# Patient Record
Sex: Female | Born: 1960 | Race: White | Hispanic: No | Smoking: Former smoker
Health system: Southern US, Community
[De-identification: ages and names within clinical notes are randomized; demographics above are authoritative.]

## PROBLEM LIST (undated history)

## (undated) DIAGNOSIS — F419 Anxiety disorder, unspecified: Secondary | ICD-10-CM

## (undated) DIAGNOSIS — R011 Cardiac murmur, unspecified: Secondary | ICD-10-CM

## (undated) DIAGNOSIS — E7800 Pure hypercholesterolemia, unspecified: Secondary | ICD-10-CM

## (undated) DIAGNOSIS — G510 Bell's palsy: Secondary | ICD-10-CM

## (undated) DIAGNOSIS — I10 Essential (primary) hypertension: Secondary | ICD-10-CM

## (undated) DIAGNOSIS — D649 Anemia, unspecified: Secondary | ICD-10-CM

## (undated) DIAGNOSIS — T7840XA Allergy, unspecified, initial encounter: Secondary | ICD-10-CM

## (undated) DIAGNOSIS — N200 Calculus of kidney: Secondary | ICD-10-CM

## (undated) DIAGNOSIS — F32A Depression, unspecified: Secondary | ICD-10-CM

## (undated) DIAGNOSIS — D869 Sarcoidosis, unspecified: Secondary | ICD-10-CM

## (undated) HISTORY — DX: Allergy, unspecified, initial encounter: T78.40XA

## (undated) HISTORY — DX: Calculus of kidney: N20.0

## (undated) HISTORY — DX: Sarcoidosis, unspecified: D86.9

## (undated) HISTORY — DX: Cardiac murmur, unspecified: R01.1

## (undated) HISTORY — DX: Anemia, unspecified: D64.9

## (undated) HISTORY — PX: CYST EXCISION: SHX5701

---

## 1998-07-29 ENCOUNTER — Other Ambulatory Visit: Admission: RE | Admit: 1998-07-29 | Discharge: 1998-07-29 | Payer: Self-pay | Admitting: Obstetrics and Gynecology

## 1998-11-17 ENCOUNTER — Other Ambulatory Visit: Admission: RE | Admit: 1998-11-17 | Discharge: 1998-11-17 | Payer: Self-pay | Admitting: Obstetrics and Gynecology

## 1999-08-05 ENCOUNTER — Other Ambulatory Visit: Admission: RE | Admit: 1999-08-05 | Discharge: 1999-08-05 | Payer: Self-pay | Admitting: Obstetrics and Gynecology

## 2000-10-24 ENCOUNTER — Other Ambulatory Visit: Admission: RE | Admit: 2000-10-24 | Discharge: 2000-10-24 | Payer: Self-pay | Admitting: Obstetrics and Gynecology

## 2001-10-28 ENCOUNTER — Other Ambulatory Visit: Admission: RE | Admit: 2001-10-28 | Discharge: 2001-10-28 | Payer: Self-pay | Admitting: Obstetrics and Gynecology

## 2002-02-19 ENCOUNTER — Emergency Department (HOSPITAL_COMMUNITY): Admission: EM | Admit: 2002-02-19 | Discharge: 2002-02-19 | Payer: Self-pay | Admitting: Emergency Medicine

## 2002-10-29 ENCOUNTER — Other Ambulatory Visit: Admission: RE | Admit: 2002-10-29 | Discharge: 2002-10-29 | Payer: Self-pay | Admitting: Obstetrics and Gynecology

## 2003-11-19 ENCOUNTER — Other Ambulatory Visit: Admission: RE | Admit: 2003-11-19 | Discharge: 2003-11-19 | Payer: Self-pay | Admitting: Obstetrics and Gynecology

## 2004-08-31 ENCOUNTER — Emergency Department (HOSPITAL_COMMUNITY): Admission: EM | Admit: 2004-08-31 | Discharge: 2004-08-31 | Payer: Self-pay | Admitting: Emergency Medicine

## 2004-11-23 ENCOUNTER — Other Ambulatory Visit: Admission: RE | Admit: 2004-11-23 | Discharge: 2004-11-23 | Payer: Self-pay | Admitting: Obstetrics and Gynecology

## 2005-12-06 ENCOUNTER — Other Ambulatory Visit: Admission: RE | Admit: 2005-12-06 | Discharge: 2005-12-06 | Payer: Self-pay | Admitting: Obstetrics and Gynecology

## 2009-11-12 ENCOUNTER — Encounter: Admission: RE | Admit: 2009-11-12 | Discharge: 2009-11-12 | Payer: Self-pay | Admitting: Obstetrics and Gynecology

## 2010-11-14 ENCOUNTER — Other Ambulatory Visit: Payer: Self-pay | Admitting: Obstetrics and Gynecology

## 2010-11-14 DIAGNOSIS — R928 Other abnormal and inconclusive findings on diagnostic imaging of breast: Secondary | ICD-10-CM

## 2010-11-21 ENCOUNTER — Ambulatory Visit
Admission: RE | Admit: 2010-11-21 | Discharge: 2010-11-21 | Disposition: A | Discharge status: Home / Self Care | Payer: BC Managed Care – PPO | Source: Ambulatory Visit | Attending: Obstetrics and Gynecology | Admitting: Obstetrics and Gynecology

## 2010-11-21 DIAGNOSIS — R928 Other abnormal and inconclusive findings on diagnostic imaging of breast: Secondary | ICD-10-CM

## 2013-02-05 ENCOUNTER — Encounter (INDEPENDENT_AMBULATORY_CARE_PROVIDER_SITE_OTHER): Payer: Self-pay | Admitting: *Deleted

## 2013-02-20 ENCOUNTER — Telehealth (INDEPENDENT_AMBULATORY_CARE_PROVIDER_SITE_OTHER): Payer: Self-pay | Admitting: *Deleted

## 2013-02-20 ENCOUNTER — Encounter (INDEPENDENT_AMBULATORY_CARE_PROVIDER_SITE_OTHER): Payer: Self-pay | Admitting: *Deleted

## 2013-02-20 ENCOUNTER — Other Ambulatory Visit (INDEPENDENT_AMBULATORY_CARE_PROVIDER_SITE_OTHER): Payer: Self-pay | Admitting: *Deleted

## 2013-02-20 DIAGNOSIS — Z1211 Encounter for screening for malignant neoplasm of colon: Secondary | ICD-10-CM

## 2013-02-20 MED ORDER — PEG-KCL-NACL-NASULF-NA ASC-C 100 G PO SOLR: 1.0000 | Freq: Once | ORAL | Status: DC

## 2013-02-20 NOTE — Telephone Encounter (Signed)
Patient needs movi prep 

## 2013-03-18 ENCOUNTER — Telehealth (INDEPENDENT_AMBULATORY_CARE_PROVIDER_SITE_OTHER): Payer: Self-pay | Admitting: *Deleted

## 2013-03-18 NOTE — Telephone Encounter (Signed)
  Procedure: tcs  Reason/Indication:  screening  Has patient had this procedure before?  no  If so, when, by whom and where?    Is there a family history of colon cancer?  no  Who?  What age when diagnosed?    Is patient diabetic?   no      Does patient have prosthetic heart valve?  no  Do you have a pacemaker?  no  Has patient ever had endocarditis? no  Has patient had joint replacement within last 12 months?  no  Is patient on Coumadin, Plavix and/or Aspirin? no  Medications: losartan 50 mg daily, imipram 25 mg 3-4 tab at bedtime, alprazolam 1 mg 1 tab up to 4 times a day  Allergies: nkda  Medication Adjustment:   Procedure date & time: 04/17/13 at 930

## 2013-03-18 NOTE — Telephone Encounter (Signed)
agree

## 2013-04-02 ENCOUNTER — Encounter (HOSPITAL_COMMUNITY): Payer: Self-pay

## 2013-04-17 ENCOUNTER — Encounter (HOSPITAL_COMMUNITY)
Admission: RE | Disposition: A | Discharge status: Home / Self Care | Payer: Self-pay | Source: Ambulatory Visit | Attending: Internal Medicine

## 2013-04-17 ENCOUNTER — Encounter (HOSPITAL_COMMUNITY): Payer: Self-pay

## 2013-04-17 ENCOUNTER — Ambulatory Visit (HOSPITAL_COMMUNITY)
Admission: RE | Admit: 2013-04-17 | Discharge: 2013-04-17 | Disposition: A | Discharge status: Home / Self Care | Payer: BC Managed Care – PPO | Source: Ambulatory Visit | Attending: Internal Medicine | Admitting: Internal Medicine

## 2013-04-17 DIAGNOSIS — K6389 Other specified diseases of intestine: Secondary | ICD-10-CM

## 2013-04-17 DIAGNOSIS — K5730 Diverticulosis of large intestine without perforation or abscess without bleeding: Secondary | ICD-10-CM

## 2013-04-17 DIAGNOSIS — D126 Benign neoplasm of colon, unspecified: Secondary | ICD-10-CM | POA: Insufficient documentation

## 2013-04-17 DIAGNOSIS — Q438 Other specified congenital malformations of intestine: Secondary | ICD-10-CM | POA: Insufficient documentation

## 2013-04-17 DIAGNOSIS — Z1211 Encounter for screening for malignant neoplasm of colon: Secondary | ICD-10-CM

## 2013-04-17 DIAGNOSIS — K648 Other hemorrhoids: Secondary | ICD-10-CM | POA: Insufficient documentation

## 2013-04-17 DIAGNOSIS — K644 Residual hemorrhoidal skin tags: Secondary | ICD-10-CM

## 2013-04-17 HISTORY — PX: COLONOSCOPY: SHX5424

## 2013-04-17 HISTORY — DX: Anxiety disorder, unspecified: F41.9

## 2013-04-17 HISTORY — DX: Essential (primary) hypertension: I10

## 2013-04-17 SURGERY — COLONOSCOPY
Anesthesia: Moderate Sedation

## 2013-04-17 MED ORDER — SODIUM CHLORIDE 0.9 % IJ SOLN
INTRAMUSCULAR | Status: AC
  Filled 2013-04-17: qty 10

## 2013-04-17 MED ORDER — SODIUM CHLORIDE 0.9 % IV SOLN
INTRAVENOUS | Status: DC
  Administered 2013-04-17: 09:00:00 via INTRAVENOUS

## 2013-04-17 MED ORDER — STERILE WATER FOR IRRIGATION IR SOLN
Status: DC | PRN
  Administered 2013-04-17: 09:00:00

## 2013-04-17 MED ORDER — MIDAZOLAM HCL 5 MG/5ML IJ SOLN
INTRAMUSCULAR | Status: AC
  Filled 2013-04-17: qty 10

## 2013-04-17 MED ORDER — MIDAZOLAM HCL 5 MG/5ML IJ SOLN
INTRAMUSCULAR | Status: AC
  Filled 2013-04-17: qty 5

## 2013-04-17 MED ORDER — PROMETHAZINE HCL 25 MG/ML IJ SOLN
INTRAMUSCULAR | Status: DC | PRN
  Administered 2013-04-17 (×2): 12.5 mg via INTRAVENOUS

## 2013-04-17 MED ORDER — MIDAZOLAM HCL 5 MG/5ML IJ SOLN
INTRAMUSCULAR | Status: DC | PRN
  Administered 2013-04-17: 2 mg via INTRAVENOUS
  Administered 2013-04-17: 3 mg via INTRAVENOUS
  Administered 2013-04-17 (×3): 2 mg via INTRAVENOUS
  Administered 2013-04-17: 3 mg via INTRAVENOUS
  Administered 2013-04-17: 2 mg via INTRAVENOUS

## 2013-04-17 MED ORDER — PROMETHAZINE HCL 25 MG/ML IJ SOLN
INTRAMUSCULAR | Status: AC
  Filled 2013-04-17: qty 1

## 2013-04-17 MED ORDER — MEPERIDINE HCL 50 MG/ML IJ SOLN
INTRAMUSCULAR | Status: AC
  Filled 2013-04-17: qty 1

## 2013-04-17 MED ORDER — MEPERIDINE HCL 50 MG/ML IJ SOLN
INTRAMUSCULAR | Status: DC | PRN
  Administered 2013-04-17 (×2): 25 mg via INTRAVENOUS

## 2013-04-17 NOTE — OR Nursing (Signed)
Patient received 16mg  Versed. Wasted 4mg  of Versed in pyxis. Pyxis only shows 15mg  Versed pulled out when actually 20 mg was pulled out creating a discrepancy.

## 2013-04-17 NOTE — H&P (Signed)
Leah Padilla is an 52 y.o. female.   Chief Complaint: Patient is for colonoscopy. HPI: Patients 53 year old Caucasian female who is here for screening colonoscopy. She denies abdominal pain change in bowel habits or bleeding. Family history is negative for colorectal carcinoma.  Past Medical History  Diagnosis Date  . Hypertension   . Anxiety     Past Surgical History  Procedure Laterality Date  . Cyst excision      neck    History reviewed. No pertinent family history. Social History:  reports that she has been smoking.  She does not have any smokeless tobacco history on file. She reports that  drinks alcohol. She reports that she does not use illicit drugs.  Allergies: No Known Allergies  Medications Prior to Admission  Medication Sig Dispense Refill  . imipramine (TOFRANIL) 25 MG tablet Take 100 mg by mouth at bedtime.      Marland Kitchen LORazepam (ATIVAN) 1 MG tablet Take 1 mg by mouth daily as needed for anxiety.      Marland Kitchen losartan (COZAAR) 50 MG tablet Take 50 mg by mouth daily.      . peg 3350 powder (MOVIPREP) 100 G SOLR Take 1 kit (100 g total) by mouth once.  1 kit  0    No results found for this or any previous visit (from the past 48 hour(s)). No results found.  ROS  Blood pressure 153/90, pulse 107, temperature 98.2 F (36.8 C), temperature source Oral, resp. rate 22, height 5\' 9"  (1.753 m), weight 155 lb (70.308 kg), SpO2 99.00%. Physical Exam  Constitutional: She appears well-developed and well-nourished.  HENT:  Mouth/Throat: Oropharynx is clear and moist.  Eyes: Conjunctivae are normal. No scleral icterus.  Neck: No thyromegaly present.  Cardiovascular: Normal rate, regular rhythm and normal heart sounds.   No murmur heard. Respiratory: Effort normal and breath sounds normal.  GI: Soft. She exhibits no distension and no mass. There is no tenderness.  Musculoskeletal: She exhibits no edema.  Lymphadenopathy:    She has no cervical adenopathy.  Neurological: She  is alert.  Skin: Skin is warm and dry.     Assessment/Plan Average risk screening colonoscopy.  Leah Padilla U 04/17/2013, 9:15 AM

## 2013-04-17 NOTE — Op Note (Signed)
COLONOSCOPY PROCEDURE REPORT  PATIENT:  Leah Padilla  MR#:  [id number] Birthdate:  1961/04/13, 52 y.o., female Endoscopist:  Dr. Malissa Hippo, MD Referred By:  Dr. Kirk Ruths, MD Procedure Date: 04/17/2013  Procedure:   Colonoscopy with snare polypectomy.  Indications:  Patient is 52 year old Caucasian female who is undergoing average risk screening colonoscopy.  Informed Consent:  The procedure and risks were reviewed with the patient and informed consent was obtained.  Medications:  Demerol 50 mg IV Versed 16 mg IV Promethazine 25 mg IV and diluted form.  Description of procedure:  After a digital rectal exam was performed, that colonoscope was advanced from the anus through the rectum and colon to the area of the cecum, ileocecal valve and appendiceal orifice. The cecum was deeply intubated. These structures were well-seen and photographed for the record. From the level of the cecum and ileocecal valve, the scope was slowly and cautiously withdrawn. The mucosal surfaces were carefully surveyed utilizing scope tip to flexion to facilitate fold flattening as needed. The scope was pulled down into the rectum where a thorough exam including retroflexion was performed.  Findings:   Prep satisfactory. Tortuous junction of sigmoid and descending colon as well as splenic flexure. Procedure was begun with pediatric colonoscope and completed with slim colonoscope. 4 x 8 mm polyp snared from ascending colon. Single small doublets limited sigmoid colon. Normal rectal mucosa. Small hemorrhoids below the dentate line along with anal papilla.   Therapeutic/Diagnostic Maneuvers Performed:  See above  Complications:  None  Cecal Withdrawal Time:  18 minutes  Impression:  Examination performed to cecum. 4 by 8 mm snared from ascending colon. Single small diverticulum at sigmoid colon. Small external hemorrhoids and anal papillae.  Recommendations:  Standard instructions  given. No aspirin or NSAIDs for 1 week. I will contact patient with biopsy results and further recommendations.  Tzvi Economou U  04/17/2013 10:35 AM  CC: Dr. Kirk Ruths, MD & Dr. Bonnetta Barry ref. provider found

## 2013-04-18 ENCOUNTER — Encounter (HOSPITAL_COMMUNITY): Payer: Self-pay | Admitting: Internal Medicine

## 2013-04-23 ENCOUNTER — Encounter (INDEPENDENT_AMBULATORY_CARE_PROVIDER_SITE_OTHER): Payer: Self-pay | Admitting: *Deleted

## 2013-12-19 ENCOUNTER — Other Ambulatory Visit: Payer: Self-pay | Admitting: Obstetrics and Gynecology

## 2014-12-23 ENCOUNTER — Other Ambulatory Visit: Payer: Self-pay | Admitting: Obstetrics and Gynecology

## 2014-12-24 LAB — CYTOLOGY - PAP

## 2015-09-12 ENCOUNTER — Encounter (HOSPITAL_COMMUNITY): Payer: Self-pay | Admitting: Emergency Medicine

## 2015-09-12 ENCOUNTER — Emergency Department (HOSPITAL_COMMUNITY): Payer: 59

## 2015-09-12 ENCOUNTER — Emergency Department (HOSPITAL_COMMUNITY)
Admission: EM | Admit: 2015-09-12 | Discharge: 2015-09-12 | Disposition: A | Discharge status: Home / Self Care | Payer: 59 | Attending: Emergency Medicine | Admitting: Emergency Medicine

## 2015-09-12 DIAGNOSIS — R109 Unspecified abdominal pain: Secondary | ICD-10-CM | POA: Diagnosis present

## 2015-09-12 DIAGNOSIS — I10 Essential (primary) hypertension: Secondary | ICD-10-CM | POA: Diagnosis not present

## 2015-09-12 DIAGNOSIS — N390 Urinary tract infection, site not specified: Secondary | ICD-10-CM

## 2015-09-12 DIAGNOSIS — F419 Anxiety disorder, unspecified: Secondary | ICD-10-CM | POA: Insufficient documentation

## 2015-09-12 DIAGNOSIS — F17210 Nicotine dependence, cigarettes, uncomplicated: Secondary | ICD-10-CM | POA: Insufficient documentation

## 2015-09-12 DIAGNOSIS — Z79899 Other long term (current) drug therapy: Secondary | ICD-10-CM | POA: Diagnosis not present

## 2015-09-12 LAB — URINALYSIS, ROUTINE W REFLEX MICROSCOPIC
Glucose, UA: NEGATIVE mg/dL
Ketones, ur: NEGATIVE mg/dL
LEUKOCYTES UA: NEGATIVE
NITRITE: NEGATIVE
Specific Gravity, Urine: 1.02 (ref 1.005–1.030)
pH: 6.5 (ref 5.0–8.0)

## 2015-09-12 LAB — URINE MICROSCOPIC-ADD ON

## 2015-09-12 LAB — CBC WITH DIFFERENTIAL/PLATELET
BASOS ABS: 0 10*3/uL (ref 0.0–0.1)
BASOS PCT: 0 %
Eosinophils Absolute: 0 10*3/uL (ref 0.0–0.7)
Eosinophils Relative: 0 %
HCT: 44 % (ref 36.0–46.0)
HEMOGLOBIN: 14.6 g/dL (ref 12.0–15.0)
LYMPHS PCT: 6 %
Lymphs Abs: 0.7 10*3/uL (ref 0.7–4.0)
MCH: 29.6 pg (ref 26.0–34.0)
MCHC: 33.2 g/dL (ref 30.0–36.0)
MCV: 89.2 fL (ref 78.0–100.0)
Monocytes Absolute: 0.8 10*3/uL (ref 0.1–1.0)
Monocytes Relative: 7 %
NEUTROS ABS: 10.4 10*3/uL — AB (ref 1.7–7.7)
NEUTROS PCT: 87 %
Platelets: 236 10*3/uL (ref 150–400)
RBC: 4.93 MIL/uL (ref 3.87–5.11)
RDW: 13.1 % (ref 11.5–15.5)
WBC: 11.9 10*3/uL — AB (ref 4.0–10.5)

## 2015-09-12 LAB — COMPREHENSIVE METABOLIC PANEL
ALK PHOS: 92 U/L (ref 38–126)
ALT: 21 U/L (ref 14–54)
ANION GAP: 9 (ref 5–15)
AST: 24 U/L (ref 15–41)
Albumin: 4.5 g/dL (ref 3.5–5.0)
BILIRUBIN TOTAL: 1 mg/dL (ref 0.3–1.2)
BUN: 14 mg/dL (ref 6–20)
CALCIUM: 9 mg/dL (ref 8.9–10.3)
CO2: 27 mmol/L (ref 22–32)
Chloride: 100 mmol/L — ABNORMAL LOW (ref 101–111)
Creatinine, Ser: 0.6 mg/dL (ref 0.44–1.00)
GFR calc non Af Amer: >60 mL/min (ref >60)
Glucose, Bld: 111 mg/dL — ABNORMAL HIGH (ref 65–99)
POTASSIUM: 3 mmol/L — AB (ref 3.5–5.1)
SODIUM: 136 mmol/L (ref 135–145)
TOTAL PROTEIN: 7.8 g/dL (ref 6.5–8.1)

## 2015-09-12 MED ORDER — SODIUM CHLORIDE 0.9 % IV BOLUS (SEPSIS)
1000.0000 mL | Freq: Once | INTRAVENOUS | Status: AC
  Administered 2015-09-12: 1000 mL via INTRAVENOUS

## 2015-09-12 MED ORDER — FENTANYL CITRATE (PF) 100 MCG/2ML IJ SOLN: 50.0000 ug | Freq: Once | INTRAMUSCULAR | Status: DC

## 2015-09-12 MED ORDER — KETOROLAC TROMETHAMINE 30 MG/ML IJ SOLN
30.0000 mg | Freq: Once | INTRAMUSCULAR | Status: AC
  Administered 2015-09-12: 30 mg via INTRAVENOUS
  Filled 2015-09-12: qty 1

## 2015-09-12 MED ORDER — DIAZEPAM 5 MG PO TABS: 5.0000 mg | ORAL_TABLET | Freq: Four times a day (QID) | ORAL | Status: DC | PRN

## 2015-09-12 MED ORDER — CEPHALEXIN 500 MG PO CAPS: 500.0000 mg | ORAL_CAPSULE | Freq: Four times a day (QID) | ORAL | Status: DC

## 2015-09-12 MED ORDER — ONDANSETRON HCL 4 MG/2ML IJ SOLN
4.0000 mg | Freq: Once | INTRAMUSCULAR | Status: AC
  Administered 2015-09-12: 4 mg via INTRAVENOUS
  Filled 2015-09-12: qty 2

## 2015-09-12 MED ORDER — CEFTRIAXONE SODIUM 1 G IJ SOLR
1.0000 g | Freq: Once | INTRAMUSCULAR | Status: AC
  Administered 2015-09-12: 1 g via INTRAVENOUS
  Filled 2015-09-12: qty 10

## 2015-09-12 MED ORDER — HYDROCODONE-ACETAMINOPHEN 5-325 MG PO TABS
1.0000 | ORAL_TABLET | Freq: Four times a day (QID) | ORAL | Status: DC | PRN
Start: 2015-09-12 — End: 2016-08-21

## 2015-09-12 NOTE — ED Provider Notes (Signed)
CSN: [id number]     Arrival date & time 09/12/15  1618 History   First MD Initiated Contact with Patient 09/12/15 1758     Chief Complaint  Patient presents with  . Flank Pain     (Consider location/radiation/quality/duration/timing/severity/associated sxs/prior Treatment) Patient is a 54 y.o. female presenting with back pain.  Back Pain Pain location: right lateral lumbar area. Quality:  Aching Radiates to:  Does not radiate Pain severity:  Mild Onset quality:  Gradual Duration:  2 weeks Timing:  Constant Progression:  Worsening Chronicity:  New Context: not emotional stress   Associated symptoms: no chest pain and no fever     Past Medical History  Diagnosis Date  . Hypertension   . Anxiety    Past Surgical History  Procedure Laterality Date  . Cyst excision      neck  . Colonoscopy N/A 04/17/2013    Procedure: COLONOSCOPY;  Surgeon: Rogene Houston, MD;  Location: [facility];  Service: Endoscopy;  Laterality: N/A;  930   Family History  Problem Relation Age of Onset  . Stroke Other   . Diabetes Other    Social History  Substance Use Topics  . Smoking status: Current Every Day Smoker -- 0.50 packs/day for 13 years    Types: Cigarettes  . Smokeless tobacco: Never Used  . Alcohol Use: Yes     Comment: occasional   OB History    Gravida Para Term Preterm AB TAB SAB Ectopic Multiple Living   2 2 2       2      Review of Systems  Constitutional: Negative for fever, chills and fatigue.  Eyes: Negative for pain.  Respiratory: Negative for cough, choking and shortness of breath.   Cardiovascular: Negative for chest pain.  Endocrine: Negative for polydipsia and polyuria.  Musculoskeletal: Positive for back pain. Negative for myalgias.  All other systems reviewed and are negative.     Allergies  Polymox  Home Medications   Prior to Admission medications   Medication Sig Start Date End Date Taking? Authorizing Provider  imipramine (TOFRANIL) 25 MG  tablet Take 100 mg by mouth at bedtime.    Historical Provider, MD  LORazepam (ATIVAN) 1 MG tablet Take 1 mg by mouth daily as needed for anxiety.    Historical Provider, MD  losartan (COZAAR) 50 MG tablet Take 50 mg by mouth daily.    Historical Provider, MD   BP 113/76 mmHg  Pulse 102  Temp(Src) 98.2 F (36.8 C) (Oral)  Resp 14  Ht 5\' 9"  (1.753 m)  Wt 156 lb (70.761 kg)  BMI 23.03 kg/m2  SpO2 99% Physical Exam  Constitutional: She is oriented to person, place, and time. She appears well-developed and well-nourished.  HENT:  Head: Normocephalic and atraumatic.  Eyes: Conjunctivae are normal. Pupils are equal, round, and reactive to light.  Neck: Normal range of motion.  Cardiovascular: Normal rate and regular rhythm.   Pulmonary/Chest: No stridor. No respiratory distress. She has no wheezes. She has no rales.  Abdominal: She exhibits no distension.  Musculoskeletal: Normal range of motion. She exhibits no edema or tenderness.  Neurological: She is alert and oriented to person, place, and time.  Nursing note and vitals reviewed.   ED Course  Procedures (including critical care time) Labs Review Labs Reviewed  URINALYSIS, ROUTINE W REFLEX MICROSCOPIC (NOT AT [facility]    Imaging Review No results found. I have personally reviewed and evaluated these images and lab results as part of  my medical decision-making.   EKG Interpretation None      MDM   Final diagnoses:  Flank pain  UTI (lower urinary tract infection)   Flank pain, currently being treated for UTI, symptoms haven't improved. Here appears well, slight ttp over right lower pelvis, VS WNL. Concern for possible MSK vs appy vs kidney stone vs perinephric abscess vs. Pyelo/cystitis. Will eval appropriately.   Ct without acute abnormality, ua with likely infection, symptoms improved. Rocephin given, will dc macrobid adn start keflex back. rx for muscle relaxers and short course of pain meds provided. Will fu w/ pcp in  3-4 days for reevaluation, otherwise return here for new/worsening symptoms.   Merrily Pew, MD 09/12/15 [phone]

## 2015-09-12 NOTE — ED Notes (Signed)
Patient c/o right flank pain that radiates into right lower abd. Per patient recently being treated for UTI with antibiotics by PCP but felt she was not improving. Patient called PCP  And they changed antibiotics, patient re-checked in office and had UA done which see was told was improving except for blood in urine. Patient reports nausea and vomiting that started last night. Denies hx of kidney stones.

## 2015-09-12 NOTE — ED Notes (Signed)
Discharge instructions given, pt demonstrated teach back and verbal understanding. No concerns voiced.  

## 2015-09-14 MED FILL — Hydrocodone-Acetaminophen Tab 5-325 MG: ORAL | Qty: 6 | Status: AC

## 2016-02-11 ENCOUNTER — Other Ambulatory Visit: Payer: Self-pay | Admitting: Obstetrics & Gynecology

## 2016-02-14 LAB — CYTOLOGY - PAP

## 2016-02-21 ENCOUNTER — Other Ambulatory Visit: Payer: Self-pay | Admitting: Obstetrics & Gynecology

## 2016-02-21 DIAGNOSIS — R928 Other abnormal and inconclusive findings on diagnostic imaging of breast: Secondary | ICD-10-CM

## 2016-02-25 ENCOUNTER — Ambulatory Visit
Admission: RE | Admit: 2016-02-25 | Discharge: 2016-02-25 | Disposition: A | Discharge status: Home / Self Care | Payer: 59 | Source: Ambulatory Visit | Attending: Obstetrics & Gynecology | Admitting: Obstetrics & Gynecology

## 2016-02-25 DIAGNOSIS — R928 Other abnormal and inconclusive findings on diagnostic imaging of breast: Secondary | ICD-10-CM

## 2016-08-21 ENCOUNTER — Emergency Department (HOSPITAL_COMMUNITY)
Admission: EM | Admit: 2016-08-21 | Discharge: 2016-08-21 | Disposition: A | Discharge status: Home / Self Care | Payer: 59 | Attending: Emergency Medicine | Admitting: Emergency Medicine

## 2016-08-21 ENCOUNTER — Encounter (HOSPITAL_COMMUNITY): Payer: Self-pay | Admitting: Emergency Medicine

## 2016-08-21 DIAGNOSIS — R109 Unspecified abdominal pain: Secondary | ICD-10-CM | POA: Insufficient documentation

## 2016-08-21 DIAGNOSIS — R110 Nausea: Secondary | ICD-10-CM | POA: Insufficient documentation

## 2016-08-21 DIAGNOSIS — R350 Frequency of micturition: Secondary | ICD-10-CM | POA: Insufficient documentation

## 2016-08-21 DIAGNOSIS — M549 Dorsalgia, unspecified: Secondary | ICD-10-CM | POA: Diagnosis not present

## 2016-08-21 DIAGNOSIS — R6883 Chills (without fever): Secondary | ICD-10-CM | POA: Diagnosis not present

## 2016-08-21 DIAGNOSIS — F17210 Nicotine dependence, cigarettes, uncomplicated: Secondary | ICD-10-CM | POA: Insufficient documentation

## 2016-08-21 DIAGNOSIS — R5383 Other fatigue: Secondary | ICD-10-CM | POA: Diagnosis not present

## 2016-08-21 DIAGNOSIS — Z79899 Other long term (current) drug therapy: Secondary | ICD-10-CM | POA: Insufficient documentation

## 2016-08-21 DIAGNOSIS — I10 Essential (primary) hypertension: Secondary | ICD-10-CM | POA: Insufficient documentation

## 2016-08-21 LAB — URINALYSIS, ROUTINE W REFLEX MICROSCOPIC
Bilirubin Urine: NEGATIVE
Glucose, UA: NEGATIVE mg/dL
Ketones, ur: NEGATIVE mg/dL
Leukocytes, UA: NEGATIVE
Nitrite: NEGATIVE
Protein, ur: NEGATIVE mg/dL
Specific Gravity, Urine: <1.005 — ABNORMAL LOW (ref 1.005–1.030)
pH: 5.5 (ref 5.0–8.0)

## 2016-08-21 LAB — COMPREHENSIVE METABOLIC PANEL
ALT: 31 U/L (ref 14–54)
AST: 29 U/L (ref 15–41)
Albumin: 4.9 g/dL (ref 3.5–5.0)
Alkaline Phosphatase: 73 U/L (ref 38–126)
Anion gap: 8 (ref 5–15)
BUN: 9 mg/dL (ref 6–20)
CO2: 25 mmol/L (ref 22–32)
Calcium: 9.8 mg/dL (ref 8.9–10.3)
Chloride: 104 mmol/L (ref 101–111)
Creatinine, Ser: 0.6 mg/dL (ref 0.44–1.00)
GFR calc Af Amer: >60 mL/min (ref >60)
GFR calc non Af Amer: >60 mL/min (ref >60)
Glucose, Bld: 99 mg/dL (ref 65–99)
Potassium: 3.3 mmol/L — ABNORMAL LOW (ref 3.5–5.1)
Sodium: 137 mmol/L (ref 135–145)
Total Bilirubin: 0.8 mg/dL (ref 0.3–1.2)
Total Protein: 8.1 g/dL (ref 6.5–8.1)

## 2016-08-21 LAB — CBC
HCT: 42.5 % (ref 36.0–46.0)
Hemoglobin: 14.3 g/dL (ref 12.0–15.0)
MCH: 29.6 pg (ref 26.0–34.0)
MCHC: 33.6 g/dL (ref 30.0–36.0)
MCV: 88 fL (ref 78.0–100.0)
Platelets: 303 10*3/uL (ref 150–400)
RBC: 4.83 MIL/uL (ref 3.87–5.11)
RDW: 13 % (ref 11.5–15.5)
WBC: 11.3 10*3/uL — ABNORMAL HIGH (ref 4.0–10.5)

## 2016-08-21 LAB — URINE MICROSCOPIC-ADD ON

## 2016-08-21 NOTE — ED Provider Notes (Signed)
[facility] DEPT Provider Note   CSN: [id number] Arrival date & time: 08/21/16  1340  By signing my name below, I, Rayna Sexton, attest that this documentation has been prepared under the direction and in the presence of Virgel Manifold, MD. Electronically Signed: Rayna Sexton, ED Scribe. 08/21/16. 4:43 PM.   History   Chief Complaint Chief Complaint  Patient presents with  . Chills    HPI HPI Comments: Leah Padilla is a 55 y.o. female who presents to the Emergency Department complaining of persistent chills x 2 weeks. She states she was originally dx with a UTI two weeks ago by her PCP. Pt states she was experiencing nausea, chills and general malaise and was d/c with doxycyline which she completed w/o relief. She was seen again by her PCP and given another round of doxycycline as well as an injection and denies any relief. Pt states her symptoms have persisted with associated urinary frequency, mild lower back pain and mild diffuse abdominal pain. Pt was sent to the ED by her PCP due to concern for a renal abscess. She denies fever, hematuria, SOB and cough.   The history is provided by the patient and medical records. No language interpreter was used.    Past Medical History:  Diagnosis Date  . Anxiety   . Hypertension     There are no active problems to display for this patient.   Past Surgical History:  Procedure Laterality Date  . COLONOSCOPY N/A 04/17/2013   Procedure: COLONOSCOPY;  Surgeon: Rogene Houston, MD;  Location: [facility];  Service: Endoscopy;  Laterality: N/A;  930  . CYST EXCISION     neck    OB History    Gravida Para Term Preterm AB Living   2 2 2     2    SAB TAB Ectopic Multiple Live Births                   Home Medications    Prior to Admission medications   Medication Sig Start Date End Date Taking? Authorizing Provider  ALPRAZolam Duanne Moron) 1 MG tablet Take 1 mg by mouth 4 (four) times daily as needed for anxiety.  09/08/15  Yes  Historical Provider, MD  doxycycline (VIBRAMYCIN) 100 MG capsule Take 100 mg by mouth 2 (two) times daily. 7 day course starting on 08/16/2016 08/16/16  Yes Historical Provider, MD  losartan (COZAAR) 50 MG tablet Take 50 mg by mouth daily.   Yes Historical Provider, MD  levofloxacin (LEVAQUIN) 500 MG tablet Take 500 mg by mouth daily. 10 day course starting on 08/08/2016-COMPLETED 08/08/16   Historical Provider, MD    Family History Family History  Problem Relation Age of Onset  . Stroke Other   . Diabetes Other     Social History Social History  Substance Use Topics  . Smoking status: Current Every Day Smoker    Packs/day: 0.50    Years: 13.00    Types: Cigarettes  . Smokeless tobacco: Never Used  . Alcohol use Yes     Comment: occasional     Allergies   Polymox [amoxicillin]   Review of Systems Review of Systems  Constitutional: Positive for chills and fatigue. Negative for fever.  Respiratory: Negative for cough and shortness of breath.   Gastrointestinal: Positive for abdominal pain.  Genitourinary: Positive for frequency. Negative for difficulty urinating, dysuria and hematuria.  Musculoskeletal: Positive for back pain.  All other systems reviewed and are negative.  Physical Exam Updated Vital Signs  BP 121/91 (BP Location: Left Arm)   Pulse 107   Temp 98.4 F (36.9 C) (Oral)   Resp 16   Ht 5\' 9"  (1.753 m)   Wt 154 lb (69.9 kg)   SpO2 100%   BMI 22.74 kg/m   Physical Exam  Constitutional: She is oriented to person, place, and time. She appears well-developed and well-nourished. No distress.  HENT:  Head: Normocephalic and atraumatic.  Eyes: EOM are normal.  Neck: Normal range of motion.  Cardiovascular: Normal rate, regular rhythm and normal heart sounds.   Pulmonary/Chest: Effort normal and breath sounds normal.  Abdominal: Soft. She exhibits no distension. There is no tenderness.  Musculoskeletal: Normal range of motion.  Neurological: She is alert  and oriented to person, place, and time.  Skin: Skin is warm and dry.  Psychiatric: She has a normal mood and affect. Judgment normal.  Nursing note and vitals reviewed.  ED Treatments / Results  Labs (all labs ordered are listed, but only abnormal results are displayed) Labs Reviewed  URINALYSIS, ROUTINE W REFLEX MICROSCOPIC (NOT AT [facility]) - Abnormal; Notable for the following:       Result Value   Specific Gravity, Urine <1.005 (*)    Hgb urine dipstick TRACE (*)    All other components within normal limits  COMPREHENSIVE METABOLIC PANEL - Abnormal; Notable for the following:    Potassium 3.3 (*)    All other components within normal limits  CBC - Abnormal; Notable for the following:    WBC 11.3 (*)    All other components within normal limits  URINE MICROSCOPIC-ADD ON - Abnormal; Notable for the following:    Squamous Epithelial / LPF 0-5 (*)    Bacteria, UA FEW (*)    All other components within normal limits    EKG  EKG Interpretation None       Radiology No results found.  Procedures Procedures  DIAGNOSTIC STUDIES: Oxygen Saturation is 100% on RA, normal by my interpretation.    COORDINATION OF CARE: 4:42 PM Discussed next steps with pt. Pt verbalized understanding and is agreeable with the plan.    Medications Ordered in ED Medications - No data to display   Initial Impression / Assessment and Plan / ED Course  I have reviewed the triage vital signs and the nursing notes.  Pertinent labs & imaging results that were available during my care of the patient were reviewed by me and considered in my medical decision making (see chart for details).  Clinical Course     55yF with chills. Her exam is reassuring. She is afebrile. He workup fairly unremarkable. Mild tachycardia noted, but clinically doubt sepsis or other emergent condition.  Final Clinical Impressions(s) / ED Diagnoses   Final diagnoses:  Chills    New Prescriptions New Prescriptions   No  medications on file     Virgel Manifold, MD 08/31/16 1433

## 2016-08-21 NOTE — ED Triage Notes (Signed)
Patient states she was told by PCP that she had UTI 2 weeks ago. States she has been on antibiotics x 2 with no relief. Complaining of "chills" x 1 week. States she called PCP and was told to go to ER "because I may have an abscess." Patient denies urinary symptoms. States "I have UTI's and never have symptoms."

## 2017-02-13 ENCOUNTER — Other Ambulatory Visit: Payer: Self-pay | Admitting: Obstetrics & Gynecology

## 2017-02-13 DIAGNOSIS — R300 Dysuria: Secondary | ICD-10-CM | POA: Diagnosis not present

## 2017-02-23 ENCOUNTER — Other Ambulatory Visit: Payer: Self-pay | Admitting: Obstetrics & Gynecology

## 2017-02-23 DIAGNOSIS — Z6821 Body mass index (BMI) 21.0-21.9, adult: Secondary | ICD-10-CM | POA: Diagnosis not present

## 2017-02-23 DIAGNOSIS — Z01419 Encounter for gynecological examination (general) (routine) without abnormal findings: Secondary | ICD-10-CM | POA: Diagnosis not present

## 2017-02-23 DIAGNOSIS — Z124 Encounter for screening for malignant neoplasm of cervix: Secondary | ICD-10-CM | POA: Diagnosis not present

## 2017-02-28 LAB — CYTOLOGY - PAP

## 2017-03-14 DIAGNOSIS — E782 Mixed hyperlipidemia: Secondary | ICD-10-CM | POA: Diagnosis not present

## 2017-03-14 DIAGNOSIS — T07XXXA Unspecified multiple injuries, initial encounter: Secondary | ICD-10-CM | POA: Diagnosis not present

## 2017-03-14 DIAGNOSIS — Z6821 Body mass index (BMI) 21.0-21.9, adult: Secondary | ICD-10-CM | POA: Diagnosis not present

## 2017-04-02 DIAGNOSIS — J019 Acute sinusitis, unspecified: Secondary | ICD-10-CM | POA: Diagnosis not present

## 2017-04-02 DIAGNOSIS — S20469A Insect bite (nonvenomous) of unspecified back wall of thorax, initial encounter: Secondary | ICD-10-CM | POA: Diagnosis not present

## 2017-04-02 DIAGNOSIS — W57XXXA Bitten or stung by nonvenomous insect and other nonvenomous arthropods, initial encounter: Secondary | ICD-10-CM | POA: Diagnosis not present

## 2017-04-02 DIAGNOSIS — R110 Nausea: Secondary | ICD-10-CM | POA: Diagnosis not present

## 2017-04-02 DIAGNOSIS — Z6820 Body mass index (BMI) 20.0-20.9, adult: Secondary | ICD-10-CM | POA: Diagnosis not present

## 2017-08-22 DIAGNOSIS — R509 Fever, unspecified: Secondary | ICD-10-CM | POA: Diagnosis not present

## 2017-08-22 DIAGNOSIS — R51 Headache: Secondary | ICD-10-CM | POA: Diagnosis not present

## 2017-08-22 DIAGNOSIS — J0100 Acute maxillary sinusitis, unspecified: Secondary | ICD-10-CM | POA: Diagnosis not present

## 2017-12-20 DIAGNOSIS — Z1389 Encounter for screening for other disorder: Secondary | ICD-10-CM | POA: Diagnosis not present

## 2017-12-20 DIAGNOSIS — E748 Other specified disorders of carbohydrate metabolism: Secondary | ICD-10-CM | POA: Diagnosis not present

## 2017-12-20 DIAGNOSIS — Z6821 Body mass index (BMI) 21.0-21.9, adult: Secondary | ICD-10-CM | POA: Diagnosis not present

## 2017-12-20 DIAGNOSIS — Z0000 Encounter for general adult medical examination without abnormal findings: Secondary | ICD-10-CM | POA: Diagnosis not present

## 2018-03-13 DIAGNOSIS — Z01419 Encounter for gynecological examination (general) (routine) without abnormal findings: Secondary | ICD-10-CM | POA: Diagnosis not present

## 2018-03-13 DIAGNOSIS — F29 Unspecified psychosis not due to a substance or known physiological condition: Secondary | ICD-10-CM | POA: Insufficient documentation

## 2018-03-13 DIAGNOSIS — R87610 Atypical squamous cells of undetermined significance on cytologic smear of cervix (ASC-US): Secondary | ICD-10-CM | POA: Diagnosis not present

## 2018-04-05 ENCOUNTER — Encounter (INDEPENDENT_AMBULATORY_CARE_PROVIDER_SITE_OTHER): Payer: Self-pay | Admitting: *Deleted

## 2018-04-12 DIAGNOSIS — R208 Other disturbances of skin sensation: Secondary | ICD-10-CM | POA: Diagnosis not present

## 2018-05-02 DIAGNOSIS — Z08 Encounter for follow-up examination after completed treatment for malignant neoplasm: Secondary | ICD-10-CM | POA: Diagnosis not present

## 2018-05-02 DIAGNOSIS — N390 Urinary tract infection, site not specified: Secondary | ICD-10-CM | POA: Diagnosis not present

## 2018-06-24 DIAGNOSIS — N3021 Other chronic cystitis with hematuria: Secondary | ICD-10-CM | POA: Diagnosis not present

## 2018-07-12 DIAGNOSIS — Z6822 Body mass index (BMI) 22.0-22.9, adult: Secondary | ICD-10-CM | POA: Diagnosis not present

## 2018-07-12 DIAGNOSIS — N760 Acute vaginitis: Secondary | ICD-10-CM | POA: Diagnosis not present

## 2018-07-12 DIAGNOSIS — N952 Postmenopausal atrophic vaginitis: Secondary | ICD-10-CM | POA: Diagnosis not present

## 2018-07-12 DIAGNOSIS — Z1389 Encounter for screening for other disorder: Secondary | ICD-10-CM | POA: Diagnosis not present

## 2018-07-25 DIAGNOSIS — N390 Urinary tract infection, site not specified: Secondary | ICD-10-CM | POA: Diagnosis not present

## 2018-07-25 DIAGNOSIS — R208 Other disturbances of skin sensation: Secondary | ICD-10-CM | POA: Diagnosis not present

## 2018-07-25 DIAGNOSIS — B373 Candidiasis of vulva and vagina: Secondary | ICD-10-CM | POA: Diagnosis not present

## 2018-08-15 DIAGNOSIS — R309 Painful micturition, unspecified: Secondary | ICD-10-CM | POA: Diagnosis not present

## 2018-08-15 DIAGNOSIS — N390 Urinary tract infection, site not specified: Secondary | ICD-10-CM | POA: Diagnosis not present

## 2018-08-15 DIAGNOSIS — N840 Polyp of corpus uteri: Secondary | ICD-10-CM | POA: Diagnosis not present

## 2018-08-15 DIAGNOSIS — R102 Pelvic and perineal pain: Secondary | ICD-10-CM | POA: Diagnosis not present

## 2018-09-06 DIAGNOSIS — D259 Leiomyoma of uterus, unspecified: Secondary | ICD-10-CM | POA: Diagnosis not present

## 2018-09-06 DIAGNOSIS — N840 Polyp of corpus uteri: Secondary | ICD-10-CM | POA: Diagnosis not present

## 2018-09-11 ENCOUNTER — Other Ambulatory Visit: Payer: Self-pay | Admitting: Obstetrics & Gynecology

## 2018-09-11 DIAGNOSIS — D259 Leiomyoma of uterus, unspecified: Secondary | ICD-10-CM | POA: Diagnosis not present

## 2019-01-01 DIAGNOSIS — I10 Essential (primary) hypertension: Secondary | ICD-10-CM | POA: Diagnosis not present

## 2019-01-01 DIAGNOSIS — E7849 Other hyperlipidemia: Secondary | ICD-10-CM | POA: Diagnosis not present

## 2019-06-19 ENCOUNTER — Other Ambulatory Visit: Payer: Self-pay | Admitting: *Deleted

## 2019-06-19 DIAGNOSIS — Z20822 Contact with and (suspected) exposure to covid-19: Secondary | ICD-10-CM

## 2019-06-21 LAB — NOVEL CORONAVIRUS, NAA: SARS-CoV-2, NAA: NOT DETECTED

## 2019-06-23 ENCOUNTER — Telehealth: Payer: Self-pay | Admitting: General Practice

## 2019-06-23 NOTE — Telephone Encounter (Signed)
Pt called in for covid result.  °Advised of Not Detected result.  °

## 2019-07-15 ENCOUNTER — Telehealth: Payer: Self-pay | Admitting: Adult Health

## 2019-07-15 NOTE — Telephone Encounter (Signed)

## 2019-07-16 ENCOUNTER — Encounter: Payer: Self-pay | Admitting: Adult Health

## 2019-07-16 ENCOUNTER — Ambulatory Visit (INDEPENDENT_AMBULATORY_CARE_PROVIDER_SITE_OTHER): Payer: 59 | Admitting: Adult Health

## 2019-07-16 ENCOUNTER — Other Ambulatory Visit: Payer: Self-pay

## 2019-07-16 VITALS — BP 124/77 | HR 91 | Ht 69.0 in | Wt 165.0 lb

## 2019-07-16 DIAGNOSIS — R319 Hematuria, unspecified: Secondary | ICD-10-CM | POA: Insufficient documentation

## 2019-07-16 DIAGNOSIS — R300 Dysuria: Secondary | ICD-10-CM | POA: Insufficient documentation

## 2019-07-16 DIAGNOSIS — N816 Rectocele: Secondary | ICD-10-CM | POA: Diagnosis not present

## 2019-07-16 DIAGNOSIS — N951 Menopausal and female climacteric states: Secondary | ICD-10-CM | POA: Diagnosis not present

## 2019-07-16 DIAGNOSIS — R232 Flushing: Secondary | ICD-10-CM | POA: Insufficient documentation

## 2019-07-16 LAB — POCT URINALYSIS DIPSTICK OB
Blood, UA: 3
Glucose, UA: NEGATIVE
Ketones, UA: NEGATIVE
Nitrite, UA: NEGATIVE
POC,PROTEIN,UA: NEGATIVE

## 2019-07-16 MED ORDER — PROGESTERONE MICRONIZED 200 MG PO CAPS: 200.0000 mg | ORAL_CAPSULE | Freq: Every day | ORAL | 3 refills | Status: DC

## 2019-07-16 MED ORDER — NITROFURANTOIN MONOHYD MACRO 100 MG PO CAPS: ORAL_CAPSULE | ORAL | 1 refills | Status: DC

## 2019-07-16 MED ORDER — ESTRADIOL 1 MG PO TABS: 1.0000 mg | ORAL_TABLET | Freq: Every day | ORAL | 3 refills | Status: DC

## 2019-07-16 NOTE — Patient Instructions (Signed)
Menopause Menopause is the normal time of life when menstrual periods stop completely. It is usually confirmed by 12 months without a menstrual period. The transition to menopause (perimenopause) most often happens between the ages of 45 and 55. During perimenopause, hormone levels change in your body, which can cause symptoms and affect your health. Menopause may increase your risk for:  Loss of bone (osteoporosis), which causes bone breaks (fractures).  Depression.  Hardening and narrowing of the arteries (atherosclerosis), which can cause heart attacks and strokes. What are the causes? This condition is usually caused by a natural change in hormone levels that happens as you get older. The condition may also be caused by surgery to remove both ovaries (bilateral oophorectomy). What increases the risk? This condition is more likely to start at an earlier age if you have certain medical conditions or treatments, including:  A tumor of the pituitary gland in the brain.  A disease that affects the ovaries and hormone production.  Radiation treatment for cancer.  Certain cancer treatments, such as chemotherapy or hormone (anti-estrogen) therapy.  Heavy smoking and excessive alcohol use.  Family history of early menopause. This condition is also more likely to develop earlier in women who are very thin. What are the signs or symptoms? Symptoms of this condition include:  Hot flashes.  Irregular menstrual periods.  Night sweats.  Changes in feelings about sex. This could be a decrease in sex drive or an increased comfort around your sexuality.  Vaginal dryness and thinning of the vaginal walls. This may cause painful intercourse.  Dryness of the skin and development of wrinkles.  Headaches.  Problems sleeping (insomnia).  Mood swings or irritability.  Memory problems.  Weight gain.  Hair growth on the face and chest.  Bladder infections or problems with urinating. How  is this diagnosed? This condition is diagnosed based on your medical history, a physical exam, your age, your menstrual history, and your symptoms. Hormone tests may also be done. How is this treated? In some cases, no treatment is needed. You and your health care provider should make a decision together about whether treatment is necessary. Treatment will be based on your individual condition and preferences. Treatment for this condition focuses on managing symptoms. Treatment may include:  Menopausal hormone therapy (MHT).  Medicines to treat specific symptoms or complications.  Acupuncture.  Vitamin or herbal supplements. Before starting treatment, make sure to let your health care provider know if you have a personal or family history of:  Heart disease.  Breast cancer.  Blood clots.  Diabetes.  Osteoporosis. Follow these instructions at home: Lifestyle  Do not use any products that contain nicotine or tobacco, such as cigarettes and e-cigarettes. If you need help quitting, ask your health care provider.  Get at least 30 minutes of physical activity on 5 or more days each week.  Avoid alcoholic and caffeinated beverages, as well as spicy foods. This may help prevent hot flashes.  Get 7-8 hours of sleep each night.  If you have hot flashes, try: ? Dressing in layers. ? Avoiding things that may trigger hot flashes, such as spicy food, warm places, or stress. ? Taking slow, deep breaths when a hot flash starts. ? Keeping a fan in your home and office.  Find ways to manage stress, such as deep breathing, meditation, or journaling.  Consider going to group therapy with other women who are having menopause symptoms. Ask your health care provider about recommended group therapy meetings. Eating and   drinking  Eat a healthy, balanced diet that contains whole grains, lean protein, low-fat dairy, and plenty of fruits and vegetables.  Your health care provider may recommend  adding more soy to your diet. Foods that contain soy include tofu, tempeh, and soy milk.  Eat plenty of foods that contain calcium and vitamin D for bone health. Items that are rich in calcium include low-fat milk, yogurt, beans, almonds, sardines, broccoli, and kale. Medicines  Take over-the-counter and prescription medicines only as told by your health care provider.  Talk with your health care provider before starting any herbal supplements. If prescribed, take vitamins and supplements as told by your health care provider. These may include: ? Calcium. Women age 25 and older should get 1,200 mg (milligrams) of calcium every day. ? Vitamin D. Women need 600-800 International Units of vitamin D each day. ? Vitamins B12 and B6. Aim for 50 micrograms of B12 and 1.5 mg of B6 each day. General instructions  Keep track of your menstrual periods, including: ? When they occur. ? How heavy they are and how long they last. ? How much time passes between periods.  Keep track of your symptoms, noting when they start, how often you have them, and how long they last.  Use vaginal lubricants or moisturizers to help with vaginal dryness and improve comfort during sex.  Keep all follow-up visits as told by your health care provider. This is important. This includes any group therapy or counseling. Contact a health care provider if:  You are still having menstrual periods after age 66.  You have pain during sex.  You have not had a period for 12 months and you develop vaginal bleeding. Get help right away if:  You have: ? Severe depression. ? Excessive vaginal bleeding. ? Pain when you urinate. ? A fast or irregular heart beat (palpitations). ? Severe headaches. ? Abdomen (abdominal) pain or severe indigestion.  You fell and you think you have a broken bone.  You develop leg or chest pain.  You develop vision problems.  You feel a lump in your breast. Summary  Menopause is the normal  time of life when menstrual periods stop completely. It is usually confirmed by 12 months without a menstrual period.  The transition to menopause (perimenopause) most often happens between the ages of 79 and 16.  Symptoms can be managed through medicines, lifestyle changes, and complementary therapies such as acupuncture.  Eat a balanced diet that is rich in nutrients to promote bone health and heart health and to manage symptoms during menopause. This information is not intended to replace advice given to you by your health care provider. Make sure you discuss any questions you have with your health care provider. Document Released: 12/09/2003 Document Revised: 08/31/2017 Document Reviewed: 10/21/2016 Elsevier Patient Education  2020 [employer]. Menopause and Hormone Replacement Therapy Menopause is a normal time of life when menstrual periods stop completely and the ovaries stop producing the female hormones estrogen and progesterone. This lack of hormones can affect your health and cause undesirable symptoms. Hormone replacement therapy (HRT) can relieve some of those symptoms. What is hormone replacement therapy? HRT is the use of artificial (synthetic) hormones to replace hormones that your body has stopped producing because you have reached menopause. What are my options for HRT?  HRT may consist of the synthetic hormones estrogen and progestin, or it may consist of only estrogen (estrogen-only therapy). You and your health care provider will decide which form of HRT  is best for you. If you choose to be on HRT and you have a uterus, estrogen and progestin are usually prescribed. Estrogen-only therapy is used for women who do not have a uterus. Possible options for taking HRT include:  Pills.  Patches.  Gels.  Sprays.  Vaginal cream.  Vaginal rings.  Vaginal inserts. The amount of hormone(s) that you take and how long you take the hormone(s) varies according to your health.  It is important to:  Begin HRT with the lowest possible dosage.  Stop HRT as soon as your health care provider tells you to stop.  Work with your health care provider so that you feel informed and comfortable with your decisions. What are the benefits of HRT? HRT can reduce the frequency and severity of menopausal symptoms. Benefits of HRT vary according to the kind of symptoms that you have, how severe they are, and your overall health. HRT may help to improve the following symptoms of menopause:  Hot flashes and night sweats. These are sudden feelings of heat that spread over the face and body. The skin may turn red, like a blush. Night sweats are hot flashes that happen while you are sleeping or trying to sleep.  Bone loss (osteoporosis). The body loses calcium more quickly after menopause, causing the bones to become weaker. This can increase the risk for bone breaks (fractures).  Vaginal dryness. The lining of the vagina can become thin and dry, which can cause pain during sex or cause infection, burning, or itching.  Urinary tract infections.  Urinary incontinence. This is the inability to control when you pass urine.  Irritability.  Short-term memory problems. What are the risks of HRT? Risks of HRT vary depending on your individual health and medical history. Risks of HRT also depend on whether you receive both estrogen and progestin or you receive estrogen only. HRT may increase the risk of:  Spotting. This is when a small amount of blood leaks from the vagina unexpectedly.  Endometrial cancer. This cancer is in the lining of the uterus (endometrium).  Breast cancer.  Increased density of breast tissue. This can make it harder to find breast cancer on a breast X-ray (mammogram).  Stroke.  Heart disease.  Blood clots.  Gallbladder disease.  Liver disease. Risks of HRT can increase if you have any of the following conditions:  Endometrial cancer.  Liver disease.   Heart disease.  Breast cancer.  History of blood clots.  History of stroke. Follow these instructions at home:  Take over-the-counter and prescription medicines only as told by your health care provider.  Get mammograms, pelvic exams, and medical checkups as often as told by your health care provider.  Have Pap tests done as often as told by your health care provider. A Pap test is sometimes called a Pap smear. It is a screening test that is used to check for signs of cancer of the cervix and vagina. A Pap test can also identify the presence of infection or precancerous changes. Pap tests may be done: ? Every 3 years, starting at age 45. ? Every 5 years, starting after age 33, in combination with testing for human papillomavirus (HPV). ? More often or less often depending on other medical conditions you have, your age, and other risk factors.  It is up to you to get the results of your Pap test. Ask your health care provider, or the department that is doing the test, when your results will be ready.  Keep  all follow-up visits as told by your health care provider. This is important. Contact a health care provider if you have:  Pain or swelling in your legs.  Shortness of breath.  Chest pain.  Lumps or changes in your breasts or armpits.  Slurred speech.  Pain, burning, or bleeding when you urinate.  Unusual vaginal bleeding.  Dizziness or headaches.  Weakness or numbness in any part of your arms or legs.  Pain in your abdomen. Summary  Menopause is a normal time of life when menstrual periods stop completely and the ovaries stop producing the female hormones estrogen and progesterone.  Hormone replacement therapy (HRT) can relieve some of the symptoms of menopause.  HRT can reduce the frequency and severity of menopausal symptoms.  Risks of HRT vary depending on your individual health and medical history. This information is not intended to replace advice given to  you by your health care provider. Make sure you discuss any questions you have with your health care provider. Document Released: 06/17/2003 Document Revised: 05/21/2018 Document Reviewed: 05/21/2018 Elsevier Patient Education  2020 [employer].

## 2019-07-16 NOTE — Progress Notes (Signed)
Patient ID: Leah Padilla, female   DOB: 1960/10/24, 58 y.o.   MRN: [id number] History of Present Illness: Leah Padilla is a 58 year old white female, divorced, PM in coming of burning with urination and feels like something is falling out vagina.Has history of UTI, and was recently treated with Macrobid and bactrim and has had sex twice  last month but not very active. PCP is Dr Gerarda Fraction.    Current Medications, Allergies, Past Medical History, Past Surgical History, Family History and Social History were reviewed in [facility] record.     Review of Systems: +burning with urination  +feels like something falling out  Has had sex recently  +hot flashes  Occasional constipation    Physical Exam:BP 124/77 (BP Location: Left Arm, Patient Position: Sitting, Cuff Size: Normal)   Pulse 91   Ht 5\' 9"  (1.753 m)   Wt 165 lb (74.8 kg)   BMI 24.37 kg/m   Urine dipstick: +leuks and +blood (not enough to send, had negative culture in past) General:  Well developed, well nourished, no acute distress Skin:  Warm and dry Neck:  Midline trachea, normal thyroid, good ROM, no lymphadenopathy Lungs; Clear to auscultation bilaterally Cardiovascular: Regular rate and rhythm Abdomen:  Soft, non tender, no hepatosplenomegaly Pelvic:  External genitalia is normal in appearance, no lesions.  The vagina is pale with loss of moisture and rugae. Urethra has no lesions or masses. The cervix is bulbous and smooth.  Uterus is felt to be normal size, shape, and contour.  No adnexal masses or tenderness noted.Bladder is non tender, no masses felt. Rectal: +rectocele  Extremities/musculoskeletal:  No swelling or varicosities noted, no clubbing or cyanosis Psych:  No mood changes, alert and cooperative,seems happy Fall risk is low Examination chaperoned by Weyman Croon FNP student.    Impression and Plan: 1. Hematuria, unspecified type  2. Hot flashes -discussed HRT risks and benefits and she  wants to try Meds ordered this encounter  Medications  . estradiol (ESTRACE) 1 MG tablet    Sig: Take 1 tablet (1 mg total) by mouth daily.    Dispense:  90 tablet    Refill:  3    Order Specific Question:   Supervising Provider    Answer:   Elonda Husky, LUTHER H [[id number]]  . progesterone (PROMETRIUM) 200 MG capsule    Sig: Take 1 capsule (200 mg total) by mouth daily.    Dispense:  90 capsule    Refill:  3    Order Specific Question:   Supervising Provider    Answer:   Elonda Husky, LUTHER H [[id number]]  . nitrofurantoin, macrocrystal-monohydrate, (MACROBID) 100 MG capsule    Sig: Take 1 after sex    Dispense:  30 capsule    Refill:  1    Order Specific Question:   Supervising Provider    Answer:   Tania Ade H [[id number]]   Follow up in 8 weeks Review handout on Menopause and HRT  3. Vaginal dryness, menopausal Will try HRT   4. Rectocele   5. Dysuria -will rx macrobid to take 1 after sex.

## 2019-07-22 ENCOUNTER — Telehealth: Payer: Self-pay | Admitting: *Deleted

## 2019-07-22 NOTE — Telephone Encounter (Signed)
Patient states she took 1 tablet each of the Estradiol and Progesterone and started feeling terrible.  She slowly started to notice vaginal itching and swelling and used Vagisil to try to get some relief.  Now she has noticed red bumps to her vaginal area.  She has not taken any of the medication in a couple of days.  She does not want to try anything else at this time.  She is unsure if the medication is in any relation to the rash to her vaginal area.  Please advise or call patient.

## 2019-07-22 NOTE — Telephone Encounter (Signed)
Took,1 estradiol and Prometrium had vaginal itching, has swelling and bump feels terrible. So has stopped them both, offered appt. Will see tomorrow at 11;30

## 2019-07-23 ENCOUNTER — Ambulatory Visit (INDEPENDENT_AMBULATORY_CARE_PROVIDER_SITE_OTHER): Payer: 59 | Admitting: Adult Health

## 2019-07-23 ENCOUNTER — Other Ambulatory Visit: Payer: Self-pay

## 2019-07-23 ENCOUNTER — Encounter: Payer: Self-pay | Admitting: Adult Health

## 2019-07-23 VITALS — BP 130/85 | HR 96 | Ht 69.0 in | Wt 167.0 lb

## 2019-07-23 DIAGNOSIS — N9089 Other specified noninflammatory disorders of vulva and perineum: Secondary | ICD-10-CM | POA: Insufficient documentation

## 2019-07-23 DIAGNOSIS — T7840XA Allergy, unspecified, initial encounter: Secondary | ICD-10-CM | POA: Diagnosis not present

## 2019-07-23 DIAGNOSIS — N898 Other specified noninflammatory disorders of vagina: Secondary | ICD-10-CM | POA: Diagnosis not present

## 2019-07-23 LAB — POCT WET PREP (WET MOUNT)
Clue Cells Wet Prep Whiff POC: POSITIVE
WBC, Wet Prep HPF POC: POSITIVE

## 2019-07-23 MED ORDER — METRONIDAZOLE 0.75 % VA GEL: VAGINAL | 0 refills | Status: DC

## 2019-07-23 MED ORDER — FLUCONAZOLE 100 MG PO TABS: ORAL_TABLET | ORAL | 0 refills | Status: DC

## 2019-07-23 NOTE — Progress Notes (Addendum)
  Subjective:     Patient ID: Leah Padilla, female   DOB: [birth year], 58 y.o.   MRN: [id number]  HPI Leah Padilla is a 58 year old, PM in complaining of vaginal swelling and itching after taking 1 estrace and Prometrium and then used vagisil and seems worse.She says she felt bad within 1 hour of taking estrace and Prometrium.  PCP is Dr Gerarda Fraction,  Review of Systems +vaginal swelling and itching    Reviewed past medical,surgical, social and family history. Reviewed medications and allergies.  Objective:   Physical Exam BP 130/85 (BP Location: Left Arm, Patient Position: Sitting, Cuff Size: Normal)   Pulse 96   Ht 5\' 9"  (1.753 m)   Wt 167 lb (75.8 kg)   BMI 24.66 kg/m   Skin warm and dry.Pelvic: external genitalia is red and swollen, no lesions,has red rash inner groin to thigh on left, vagina: white discharge with odor,urethra has no lesions or masses noted, cervix:smooth and bulbous, uterus: normal size, shape and contour, non tender, no masses felt, adnexa: no masses or tenderness noted. Bladder is non tender and no masses felt. Wet prep: + for parabasal cells and +WBCs Examination chaperoned by Rolena Infante LPN    Assessment:     1. Vaginal discharge   2. Vulvar irritation   3. Allergic reaction, initial encounter       Plan:    No vagisil  Hold off on estrace and Prometrium for now Meds ordered this encounter  Medications  . metroNIDAZOLE (METROGEL) 0.75 % vaginal gel    Sig: Use 1 applicator in vagina at HS for 5 days    Dispense:  70 g    Refill:  0    Order Specific Question:   Supervising Provider    Answer:   Elonda Husky, LUTHER H [[id number]]  . fluconazole (DIFLUCAN) 100 MG tablet    Sig: Take 1 now and repeat 1 in 3 days    Dispense:  2 tablet    Refill:  0    Order Specific Question:   Supervising Provider    Answer:   Florian Buff [[id number]]  Can take antihistamine Note given to return to work 07/28/19  Follow up prn

## 2019-08-04 ENCOUNTER — Telehealth: Payer: Self-pay | Admitting: *Deleted

## 2019-08-04 MED ORDER — FLUCONAZOLE 100 MG PO TABS: ORAL_TABLET | ORAL | 0 refills | Status: DC

## 2019-08-04 NOTE — Telephone Encounter (Signed)
Pt was feeling much better but has some itching will refill diflucan

## 2019-08-04 NOTE — Telephone Encounter (Signed)
Patient left message requesting to speak to [facility].

## 2019-08-04 NOTE — Addendum Note (Signed)
Addended by: Derrek Monaco A on: 08/04/2019 04:47 PM   Modules accepted: Orders

## 2019-08-04 NOTE — Telephone Encounter (Signed)
LMOVM returning patient's call.  

## 2019-08-04 NOTE — Telephone Encounter (Signed)
Patient left message requesting that Anderson Malta call her. Did not state the reason.

## 2019-09-09 ENCOUNTER — Telehealth: Payer: Self-pay | Admitting: Adult Health

## 2019-09-09 NOTE — Telephone Encounter (Signed)

## 2019-09-10 ENCOUNTER — Ambulatory Visit: Payer: 59 | Admitting: Adult Health

## 2020-01-13 ENCOUNTER — Other Ambulatory Visit: Payer: Self-pay

## 2020-01-13 ENCOUNTER — Ambulatory Visit: Payer: 59 | Attending: Internal Medicine

## 2020-01-13 DIAGNOSIS — Z20822 Contact with and (suspected) exposure to covid-19: Secondary | ICD-10-CM

## 2020-01-14 LAB — SARS-COV-2, NAA 2 DAY TAT

## 2020-01-14 LAB — NOVEL CORONAVIRUS, NAA: SARS-CoV-2, NAA: NOT DETECTED

## 2020-01-15 ENCOUNTER — Telehealth: Payer: Self-pay

## 2020-01-15 NOTE — Telephone Encounter (Signed)
Patient given result and verbalized understanding

## 2020-02-11 ENCOUNTER — Ambulatory Visit (INDEPENDENT_AMBULATORY_CARE_PROVIDER_SITE_OTHER): Payer: 59 | Admitting: Adult Health

## 2020-02-11 ENCOUNTER — Encounter: Payer: Self-pay | Admitting: Adult Health

## 2020-02-11 VITALS — BP 136/80 | HR 89 | Ht 69.0 in | Wt 171.0 lb

## 2020-02-11 DIAGNOSIS — R319 Hematuria, unspecified: Secondary | ICD-10-CM | POA: Diagnosis not present

## 2020-02-11 DIAGNOSIS — N390 Urinary tract infection, site not specified: Secondary | ICD-10-CM | POA: Insufficient documentation

## 2020-02-11 DIAGNOSIS — R350 Frequency of micturition: Secondary | ICD-10-CM | POA: Diagnosis not present

## 2020-02-11 LAB — POCT URINALYSIS DIPSTICK
Glucose, UA: NEGATIVE
Ketones, UA: NEGATIVE
Nitrite, UA: NEGATIVE
Protein, UA: NEGATIVE

## 2020-02-11 MED ORDER — NITROFURANTOIN MONOHYD MACRO 100 MG PO CAPS: ORAL_CAPSULE | ORAL | 0 refills | Status: DC

## 2020-02-11 MED ORDER — SULFAMETHOXAZOLE-TRIMETHOPRIM 800-160 MG PO TABS: 1.0000 | ORAL_TABLET | Freq: Two times a day (BID) | ORAL | 0 refills | Status: DC

## 2020-02-11 NOTE — Progress Notes (Addendum)
  Subjective:     Patient ID: Leah Padilla, female   DOB: 28-Mar-1961, 59 y.o.   MRN: [id number]  HPI Leah Padilla is a 59 year old white female,divorced, with partner, in complaining of urinary frequency and low back pain for last few days, seems to happen after sex. PCP is Dr Gerarda Fraction.   Review of Systems +urianry frequency +low back pain Usually happens after sex, last sex last week Reviewed past medical,surgical, social and family history. Reviewed medications and allergies.     Objective:   Physical Exam BP 136/80 (BP Location: Left Arm, Patient Position: Sitting, Cuff Size: Normal)   Pulse 89   Ht 5\' 9"  (1.753 m)   Wt 171 lb (77.6 kg)   BMI 25.25 kg/m urine dipstick is 1+ Leuks and trace blood   Skin warm and dry. Lungs: clear to ausculation bilaterally. Cardiovascular: regular rate and rhythm. No CVAT  Assessment:     1. Urinary frequency  2. Urinary tract infection with hematuria, site unspecified [place] C&S sent Will rx septra ds and then macrobid after sex Pee before and after sex Meds ordered this encounter  Medications  . sulfamethoxazole-trimethoprim (BACTRIM DS) 800-160 MG tablet    Sig: Take 1 tablet by mouth 2 (two) times daily. Take 1 bid    Dispense:  14 tablet    Refill:  0    Order Specific Question:   Supervising Provider    Answer:   Elonda Husky, LUTHER H [[id number]]  . nitrofurantoin, macrocrystal-monohydrate, (MACROBID) 100 MG capsule    Sig: Take 1 after sex    Dispense:  30 capsule    Refill:  0    Order Specific Question:   Supervising Provider    Answer:   Florian Buff [[id number]]      Plan:     Follow up in 2 weeks for POC and recheck back

## 2020-02-12 LAB — URINALYSIS, ROUTINE W REFLEX MICROSCOPIC
Bilirubin, UA: NEGATIVE
Glucose, UA: NEGATIVE
Ketones, UA: NEGATIVE
Nitrite, UA: NEGATIVE
Protein,UA: NEGATIVE
Specific Gravity, UA: 1.008 (ref 1.005–1.030)
Urobilinogen, Ur: 0.2 mg/dL (ref 0.2–1.0)
pH, UA: 7.5 (ref 5.0–7.5)

## 2020-02-12 LAB — MICROSCOPIC EXAMINATION
Casts: NONE SEEN /lpf
RBC, Urine: NONE SEEN /hpf (ref 0–2)
WBC, UA: NONE SEEN /hpf (ref 0–5)

## 2020-02-13 LAB — URINE CULTURE: Organism ID, Bacteria: NO GROWTH

## 2020-02-24 ENCOUNTER — Telehealth: Payer: Self-pay | Admitting: Adult Health

## 2020-02-24 NOTE — Telephone Encounter (Signed)

## 2020-02-25 ENCOUNTER — Ambulatory Visit (INDEPENDENT_AMBULATORY_CARE_PROVIDER_SITE_OTHER): Payer: 59 | Admitting: Adult Health

## 2020-02-25 ENCOUNTER — Encounter: Payer: Self-pay | Admitting: Adult Health

## 2020-02-25 VITALS — BP 117/76 | HR 89 | Ht 69.0 in | Wt 170.0 lb

## 2020-02-25 DIAGNOSIS — M545 Low back pain: Secondary | ICD-10-CM

## 2020-02-25 DIAGNOSIS — Z87440 Personal history of urinary (tract) infections: Secondary | ICD-10-CM | POA: Insufficient documentation

## 2020-02-25 DIAGNOSIS — R350 Frequency of micturition: Secondary | ICD-10-CM | POA: Diagnosis not present

## 2020-02-25 LAB — POCT URINALYSIS DIPSTICK
Blood, UA: NEGATIVE
Glucose, UA: NEGATIVE
Ketones, UA: NEGATIVE
Leukocytes, UA: NEGATIVE
Nitrite, UA: NEGATIVE
Protein, UA: NEGATIVE
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
## 2020-05-26 ENCOUNTER — Telehealth: Payer: Self-pay | Admitting: Adult Health

## 2020-05-26 MED ORDER — SULFAMETHOXAZOLE-TRIMETHOPRIM 800-160 MG PO TABS: 1.0000 | ORAL_TABLET | Freq: Two times a day (BID) | ORAL | 0 refills | Status: DC

## 2020-05-26 NOTE — Telephone Encounter (Signed)
Forgot to take Macrobid after sex Will rx septra ds then use Macrobid after sex she voiced understanding

## 2020-05-26 NOTE — Telephone Encounter (Signed)
Patient called stating that she has a UTI and would like for Leah Padilla to call her something in to the [facility] in [place]. Please contact pt

## 2020-05-26 NOTE — Addendum Note (Signed)
Addended by: Derrek Monaco A on: 05/26/2020 05:09 PM   Modules accepted: Orders

## 2020-06-21 ENCOUNTER — Telehealth: Payer: Self-pay | Admitting: Adult Health

## 2020-06-21 MED ORDER — SULFAMETHOXAZOLE-TRIMETHOPRIM 800-160 MG PO TABS: 1.0000 | ORAL_TABLET | Freq: Two times a day (BID) | ORAL | 0 refills | Status: DC

## 2020-06-21 NOTE — Addendum Note (Signed)
Addended by: Derrek Monaco A on: 06/21/2020 01:22 PM   Modules accepted: Orders

## 2020-06-21 NOTE — Telephone Encounter (Signed)
Will rx septra ds  

## 2020-06-21 NOTE — Telephone Encounter (Addendum)
Pt is on Macrobid after sex. Pt has a UTI. + Urinary frequency and burning with urination. Pt states she gets frequent UTI's after sex. Please advise. Thanks!! [place]

## 2020-06-21 NOTE — Telephone Encounter (Signed)
Patient called stating that she would like a call back from [place], Patient did not state the reason for the call. Please contact pt

## 2020-07-15 ENCOUNTER — Other Ambulatory Visit (INDEPENDENT_AMBULATORY_CARE_PROVIDER_SITE_OTHER): Payer: 59 | Admitting: *Deleted

## 2020-07-15 DIAGNOSIS — R300 Dysuria: Secondary | ICD-10-CM | POA: Diagnosis not present

## 2020-07-15 LAB — POCT URINALYSIS DIPSTICK OB
Glucose, UA: NEGATIVE
Ketones, UA: NEGATIVE
Leukocytes, UA: NEGATIVE
Nitrite, UA: NEGATIVE
POC,PROTEIN,UA: NEGATIVE

## 2020-07-15 NOTE — Progress Notes (Signed)
Chart reviewed for nurse visit. Agree with plan of care.  Estill Dooms, NP 07/15/2020 3:08 PM

## 2020-07-15 NOTE — Progress Notes (Signed)
   NURSE VISIT- UTI SYMPTOMS   SUBJECTIVE:  Leah Padilla is a 59 y.o. [id number] female here for UTI symptoms. She is a GYN patient. She reports dysuria.  OBJECTIVE:  There were no vitals taken for this visit.   Appears well, in no apparent distress  Results for orders placed or performed in visit on 07/15/20 (from the past 24 hour(s))  POC Urinalysis Dipstick OB   Collection Time: 07/15/20  2:41 PM  Result Value Ref Range   Color, UA     Clarity, UA     Glucose, UA Negative Negative   Bilirubin, UA     Ketones, UA neg    Spec Grav, UA     Blood, UA trace    pH, UA     POC,PROTEIN,UA Negative Negative, Trace, Small (1+), Moderate (2+), Large (3+), 4+   Urobilinogen, UA     Nitrite, UA neg    Leukocytes, UA Negative Negative   Appearance     Odor      ASSESSMENT: GYN patient with UTI symptoms and negative nitrites  PLAN: Note routed to Derrek Monaco, AGNP   Rx sent by provider today: No Urine culture sent Call or return to clinic prn if these symptoms worsen or fail to improve as anticipated. Follow-up: as needed   Leah Padilla  07/15/2020 2:41 PM

## 2020-07-16 LAB — URINALYSIS
Bilirubin, UA: NEGATIVE
Glucose, UA: NEGATIVE
Ketones, UA: NEGATIVE
Leukocytes,UA: NEGATIVE
Nitrite, UA: NEGATIVE
Protein,UA: NEGATIVE
RBC, UA: NEGATIVE
Specific Gravity, UA: 1.019 (ref 1.005–1.030)
Urobilinogen, Ur: 0.2 mg/dL (ref 0.2–1.0)
pH, UA: 8 — ABNORMAL HIGH (ref 5.0–7.5)

## 2020-07-18 LAB — URINE CULTURE

## 2020-07-20 ENCOUNTER — Telehealth: Payer: Self-pay | Admitting: *Deleted

## 2020-07-20 NOTE — Telephone Encounter (Signed)
Left message letting pt know urine culture shows no growth. Call with any questions. [place]

## 2020-07-20 NOTE — Telephone Encounter (Signed)
-----   Message from Estill Dooms, NP sent at 07/20/2020  9:10 AM EDT ----- Let pt know urine showed no growth

## 2020-10-11 ENCOUNTER — Other Ambulatory Visit (INDEPENDENT_AMBULATORY_CARE_PROVIDER_SITE_OTHER): Payer: 59 | Admitting: *Deleted

## 2020-10-11 ENCOUNTER — Other Ambulatory Visit: Payer: Self-pay

## 2020-10-11 DIAGNOSIS — R350 Frequency of micturition: Secondary | ICD-10-CM

## 2020-10-11 LAB — POCT URINALYSIS DIPSTICK OB
Glucose, UA: NEGATIVE
Ketones, UA: NEGATIVE
Nitrite, UA: NEGATIVE
POC,PROTEIN,UA: NEGATIVE

## 2020-10-11 NOTE — Progress Notes (Signed)
Chart reviewed for nurse visit. Agree with plan of care. No rx sent will await culture  Estill Dooms, NP 10/11/2020 4:51 PM

## 2020-10-11 NOTE — Progress Notes (Addendum)
   NURSE VISIT- UTI SYMPTOMS   SUBJECTIVE:  Leah Padilla is a 60 y.o. G64P2002 female here for UTI symptoms. She is a GYN patient. She reports urinary frequency.  OBJECTIVE:  There were no vitals taken for this visit.  Appears well, in no apparent distress  No results found for this or any previous visit (from the past 24 hour(s)).  ASSESSMENT: GYN patient with UTI symptoms and negative nitrites  PLAN: Note routed to Derrek Monaco, AGNP   Rx sent by provider today: no Urine culture yes  Call or return to clinic prn if these symptoms worsen or fail to improve as anticipated. Follow-up: as scheduled   Leah Padilla  10/11/2020 2:55 PM

## 2020-10-12 LAB — URINALYSIS
Bilirubin, UA: NEGATIVE
Glucose, UA: NEGATIVE
Ketones, UA: NEGATIVE
Nitrite, UA: NEGATIVE
Protein,UA: NEGATIVE
RBC, UA: NEGATIVE
Specific Gravity, UA: 1.016 (ref 1.005–1.030)
Urobilinogen, Ur: 1 mg/dL (ref 0.2–1.0)
pH, UA: 7 (ref 5.0–7.5)

## 2020-10-13 ENCOUNTER — Telehealth: Payer: Self-pay

## 2020-10-13 LAB — URINE CULTURE

## 2020-10-13 NOTE — Telephone Encounter (Signed)
Called pt to inform her of UA results. No answer, left msg.

## 2020-10-13 NOTE — Telephone Encounter (Signed)
Pt returned call from msg left on vm. Informed pt of urine culture results. Pt reports feeling better from medication prescribed.

## 2021-07-14 ENCOUNTER — Telehealth: Payer: Self-pay | Admitting: Adult Health

## 2021-07-14 ENCOUNTER — Other Ambulatory Visit: Payer: Self-pay

## 2021-07-14 ENCOUNTER — Other Ambulatory Visit (INDEPENDENT_AMBULATORY_CARE_PROVIDER_SITE_OTHER): Payer: 59

## 2021-07-14 DIAGNOSIS — R399 Unspecified symptoms and signs involving the genitourinary system: Secondary | ICD-10-CM | POA: Diagnosis not present

## 2021-07-14 LAB — POCT URINALYSIS DIPSTICK OB
Glucose, UA: NEGATIVE
Ketones, UA: NEGATIVE
Nitrite, UA: NEGATIVE
POC,PROTEIN,UA: NEGATIVE

## 2021-07-14 NOTE — Progress Notes (Signed)
Chart reviewed for nurse visit. Agree with plan of care.  Estill Dooms, NP 07/14/2021 2:14 PM

## 2021-07-14 NOTE — Telephone Encounter (Signed)
Returned pt's call. Two identifiers used. Pt explained that she was having significant burning with urination and urinary frequency starting approx 2 days ago. Pt was asked if she could come and give [place] a urine sample for a culture. Appt made. Pt confirmed understanding.

## 2021-07-14 NOTE — Progress Notes (Addendum)
   NURSE VISIT- UTI SYMPTOMS   SUBJECTIVE:  Leah Padilla is a 60 y.o. G81P2002 female here for UTI symptoms. She is a GYN patient. She reports urinary frequency and burning with urination .  OBJECTIVE:  There were no vitals taken for this visit.  Appears well, in no apparent distress  Results for orders placed or performed in visit on 07/14/21 (from the past 24 hour(s))  POC Urinalysis Dipstick OB   Collection Time: 07/14/21  2:09 PM  Result Value Ref Range   Color, UA     Clarity, UA     Glucose, UA Negative Negative   Bilirubin, UA     Ketones, UA neg    Spec Grav, UA     Blood, UA mod    pH, UA     POC,PROTEIN,UA Negative Negative, Trace, Small (1+), Moderate (2+), Large (3+), 4+   Urobilinogen, UA     Nitrite, UA neg    Leukocytes, UA Moderate (2+) (A) Negative   Appearance     Odor      ASSESSMENT: GYN patient with UTI symptoms and negative nitrites  PLAN: Note routed to Derrek Monaco, AGNP   Rx sent by provider today: No Urine culture sent Call or return to clinic prn if these symptoms worsen or fail to improve as anticipated. Follow-up: as needed   Jazziel Fitzsimmons A Camryn Quesinberry  07/14/2021 2:10 PM

## 2021-07-14 NOTE — Telephone Encounter (Signed)
Patient wants nurse to call her about a possible uti.

## 2021-07-15 ENCOUNTER — Telehealth: Payer: Self-pay | Admitting: Adult Health

## 2021-07-15 LAB — URINALYSIS, ROUTINE W REFLEX MICROSCOPIC
Bilirubin, UA: NEGATIVE
Glucose, UA: NEGATIVE
Ketones, UA: NEGATIVE
Nitrite, UA: NEGATIVE
Specific Gravity, UA: 1.014 (ref 1.005–1.030)
Urobilinogen, Ur: 0.2 mg/dL (ref 0.2–1.0)
pH, UA: 7.5 (ref 5.0–7.5)

## 2021-07-15 LAB — MICROSCOPIC EXAMINATION
Bacteria, UA: NONE SEEN
Casts: NONE SEEN /lpf
WBC, UA: >30 /hpf — AB (ref 0–5)

## 2021-07-15 MED ORDER — SULFAMETHOXAZOLE-TRIMETHOPRIM 800-160 MG PO TABS: 1.0000 | ORAL_TABLET | Freq: Two times a day (BID) | ORAL | 0 refills | Status: DC

## 2021-07-15 NOTE — Telephone Encounter (Signed)
Returned pt's call. Two identifiers used. Pt stated that she was still having lots of symptoms. Informed her that Derrek Monaco would prescribe an antibiotic, but it may change once the culture results. Pt confirmed understanding.

## 2021-07-15 NOTE — Addendum Note (Signed)
Addended by: Derrek Monaco A on: 07/15/2021 10:05 AM   Modules accepted: Orders

## 2021-07-15 NOTE — Telephone Encounter (Signed)
Pt calling stating that she came in yesterday for a uti but no one has given her any results or said if anything was been sent to [facility], [facility] [address] and pt work number [phone] states to call her there states can leave msg

## 2021-07-15 NOTE — Telephone Encounter (Signed)
Rx sent for septra ds

## 2021-07-17 LAB — URINE CULTURE

## 2021-07-18 ENCOUNTER — Telehealth: Payer: Self-pay | Admitting: *Deleted

## 2021-07-18 NOTE — Telephone Encounter (Signed)
Pt aware urine culture showed no growth. Pt voiced understanding. [place]

## 2021-08-03 ENCOUNTER — Other Ambulatory Visit: Payer: Self-pay

## 2021-08-03 ENCOUNTER — Encounter: Payer: Self-pay | Admitting: Adult Health

## 2021-08-03 ENCOUNTER — Ambulatory Visit (INDEPENDENT_AMBULATORY_CARE_PROVIDER_SITE_OTHER): Payer: 59 | Admitting: Adult Health

## 2021-08-03 VITALS — BP 129/79 | HR 101 | Ht 68.0 in | Wt 175.4 lb

## 2021-08-03 DIAGNOSIS — R319 Hematuria, unspecified: Secondary | ICD-10-CM | POA: Diagnosis not present

## 2021-08-03 DIAGNOSIS — R350 Frequency of micturition: Secondary | ICD-10-CM

## 2021-08-03 DIAGNOSIS — R300 Dysuria: Secondary | ICD-10-CM

## 2021-08-03 DIAGNOSIS — N390 Urinary tract infection, site not specified: Secondary | ICD-10-CM | POA: Insufficient documentation

## 2021-08-03 DIAGNOSIS — N952 Postmenopausal atrophic vaginitis: Secondary | ICD-10-CM

## 2021-08-03 LAB — POCT URINALYSIS DIPSTICK OB
Glucose, UA: NEGATIVE
Ketones, UA: NEGATIVE
Nitrite, UA: NEGATIVE
POC,PROTEIN,UA: NEGATIVE

## 2021-08-03 MED ORDER — PHENAZOPYRIDINE HCL 200 MG PO TABS: ORAL_TABLET | ORAL | 0 refills | Status: DC

## 2021-08-03 NOTE — Progress Notes (Signed)
  Subjective:     Patient ID: Leah Padilla, female   DOB: 03-Jun-1961, 60 y.o.   MRN: [id number]  HPI Leah Padilla is a 60 year old white female,divorced, PM in complaining of burning with urination and frequency and had some urge incontinence this morning. Has low back pain too but cleaned house. Had sex yesterday.  PCP is Dr Gerarda Fraction.   Review of Systems +burning urination +urinary frequency +UI +low back pain. Reviewed past medical,surgical, social and family history. Reviewed medications and allergies.     Objective:   Physical Exam BP 129/79 (BP Location: Right Arm, Patient Position: Sitting, Cuff Size: Normal)   Pulse (!) 101   Ht 5\' 8"  (1.727 m)   Wt 175 lb 6.4 oz (79.6 kg)   BMI 26.67 kg/m     Urine large WBC and Blood. Skin warm and dry.NO CVAT. Pelvic: external genitalia is normal in appearance no lesions, vagina: pale with loss of rugae,urethra has no lesions or masses noted, cervix:smooth, uterus: normal size, shape and contour, non tender, no masses felt, adnexa: no masses or tenderness noted. Bladder is non tender and no masses felt.  Fall risk is low  [place] - 08/03/21 0939       Pregnancy Intention Screening   Does the patient want to become pregnant in the next year? N/A    Does the patient's partner want to become pregnant in the next year? N/A    Would the patient like to discuss contraceptive options today? No      Contraception Wrap Up   Current Method No Method - Other Reason   post-menopausal   End Method No Method - Other Reason    Contraception Counseling Provided No            Examination chaperoned by August Luz NP student.  Assessment:     1. Recurrent UTI Will send UA C&S to rule out UTI - POC Urinalysis Dipstick OB - Urinalysis, Routine w reflex microscopic - Urine Culture  2. Hematuria, unspecified type  3. Urinary frequency  4. Burning with urination Will rx pyridium for burning and await culture to see if antibiotic needed   Meds ordered this encounter  Medications   phenazopyridine (PYRIDIUM) 200 MG tablet    Sig: Take 1 tid for 3 days    Dispense:  10 tablet    Refill:  0    Order Specific Question:   Supervising Provider    Answer:   Elonda Husky, LUTHER H [[id number]]     5. Vaginal atrophy Offered estrace cream, but she declines for now Had burning with PVC in the past    Plan:     Follow up prn

## 2021-08-08 ENCOUNTER — Telehealth: Payer: Self-pay | Admitting: Adult Health

## 2021-08-08 NOTE — Telephone Encounter (Signed)
Left message letting pt know we don't have results on the urine culture yet. Once we get the culture results, we will be in touch. [place]

## 2021-08-08 NOTE — Telephone Encounter (Signed)
Patient was calling about her lab results. She was wanting to see if anything was going to be called in. She is at work and said that you could leave a message on her phone.

## 2021-08-09 ENCOUNTER — Telehealth: Payer: Self-pay | Admitting: Adult Health

## 2021-08-09 LAB — URINALYSIS, ROUTINE W REFLEX MICROSCOPIC
Bilirubin, UA: NEGATIVE
Glucose, UA: NEGATIVE
Ketones, UA: NEGATIVE
Nitrite, UA: NEGATIVE
Specific Gravity, UA: 1.014 (ref 1.005–1.030)
Urobilinogen, Ur: 0.2 mg/dL (ref 0.2–1.0)
pH, UA: 6.5 (ref 5.0–7.5)

## 2021-08-09 LAB — MICROSCOPIC EXAMINATION
Bacteria, UA: NONE SEEN
Casts: NONE SEEN /lpf
WBC, UA: >30 /hpf — AB (ref 0–5)

## 2021-08-09 LAB — URINE CULTURE

## 2021-08-09 MED ORDER — SULFAMETHOXAZOLE-TRIMETHOPRIM 800-160 MG PO TABS: 1.0000 | ORAL_TABLET | Freq: Two times a day (BID) | ORAL | 0 refills | Status: DC

## 2021-08-09 NOTE — Telephone Encounter (Signed)
Left message that urine + E coli, will rx septra ds

## 2021-09-01 ENCOUNTER — Telehealth: Payer: Self-pay | Admitting: Adult Health

## 2021-09-01 NOTE — Telephone Encounter (Signed)
Pt was advised to come in so we can check her urine. Pt is at work today and can't come in today but can come in tomorrow. Pt was advised to drink lots and lots of water in the meantime. Call transferred to [facility] for nurse visit tomorrow. [employer]

## 2021-09-01 NOTE — Telephone Encounter (Signed)
Patient wants to know if Anderson Malta can call her in something for a uti.

## 2021-09-02 ENCOUNTER — Other Ambulatory Visit: Payer: Self-pay

## 2021-09-02 ENCOUNTER — Other Ambulatory Visit (INDEPENDENT_AMBULATORY_CARE_PROVIDER_SITE_OTHER): Payer: 59

## 2021-09-02 DIAGNOSIS — R3915 Urgency of urination: Secondary | ICD-10-CM | POA: Diagnosis not present

## 2021-09-02 DIAGNOSIS — R350 Frequency of micturition: Secondary | ICD-10-CM

## 2021-09-02 DIAGNOSIS — R300 Dysuria: Secondary | ICD-10-CM

## 2021-09-02 LAB — POCT URINALYSIS DIPSTICK
Blood, UA: NEGATIVE
Glucose, UA: NEGATIVE
Ketones, UA: NEGATIVE
Nitrite, UA: NEGATIVE
Protein, UA: NEGATIVE

## 2021-09-02 NOTE — Progress Notes (Signed)
   NURSE VISIT- UTI SYMPTOMS   SUBJECTIVE:  Leah Padilla is a 60 y.o. G44P2002 female here for UTI symptoms. She is a GYN patient. She reports dysuria, urinary frequency, and urinary urgency for about 5 days.  OBJECTIVE:  There were no vitals taken for this visit.  Appears well, in no apparent distress  Results for orders placed or performed in visit on 09/02/21 (from the past 24 hour(s))  POCT Urinalysis Dipstick   Collection Time: 09/02/21 11:34 AM  Result Value Ref Range   Color, UA     Clarity, UA     Glucose, UA Negative Negative   Bilirubin, UA     Ketones, UA neg    Spec Grav, UA     Blood, UA neg    pH, UA     Protein, UA Negative Negative   Urobilinogen, UA     Nitrite, UA neg    Leukocytes, UA Moderate (2+) (A) Negative   Appearance     Odor      ASSESSMENT: GYN patient with UTI symptoms and negative nitrites  PLAN: Discussed with Derrek Monaco, AGNP   Rx sent by provider today: No Urine culture sent Call or return to clinic prn if these symptoms worsen or fail to improve as anticipated. Follow-up: as needed   Alice Rieger  09/02/2021 12:32 PM

## 2021-09-02 NOTE — Progress Notes (Signed)
Chart reviewed for nurse visit. Agree with plan of care.  Estill Dooms, NP 09/02/2021 2:08 PM

## 2021-09-03 LAB — URINALYSIS, ROUTINE W REFLEX MICROSCOPIC
Bilirubin, UA: NEGATIVE
Glucose, UA: NEGATIVE
Ketones, UA: NEGATIVE
Nitrite, UA: NEGATIVE
Protein,UA: NEGATIVE
Specific Gravity, UA: 1.006 (ref 1.005–1.030)
Urobilinogen, Ur: 0.2 mg/dL (ref 0.2–1.0)
pH, UA: 7 (ref 5.0–7.5)

## 2021-09-03 LAB — MICROSCOPIC EXAMINATION
Casts: NONE SEEN /lpf
WBC, UA: >30 /hpf — AB (ref 0–5)

## 2021-09-07 ENCOUNTER — Telehealth: Payer: Self-pay | Admitting: Adult Health

## 2021-09-07 LAB — URINE CULTURE

## 2021-09-07 MED ORDER — CIPROFLOXACIN HCL 500 MG PO TABS: 500.0000 mg | ORAL_TABLET | Freq: Two times a day (BID) | ORAL | 0 refills | Status: DC

## 2021-09-07 NOTE — Telephone Encounter (Signed)
She got my message about urine being + E coli and has gotten cipro, she has had constipation, try senokot S 1-2 daily.

## 2021-09-07 NOTE — Telephone Encounter (Signed)
Left message urine + Ecoli sent in Rx for cipro

## 2021-09-09 ENCOUNTER — Other Ambulatory Visit: Payer: Self-pay | Admitting: Obstetrics & Gynecology

## 2021-09-09 ENCOUNTER — Telehealth: Payer: Self-pay | Admitting: *Deleted

## 2021-09-09 MED ORDER — NITROFURANTOIN MONOHYD MACRO 100 MG PO CAPS: 100.0000 mg | ORAL_CAPSULE | Freq: Two times a day (BID) | ORAL | 0 refills | Status: DC

## 2021-09-09 NOTE — Telephone Encounter (Signed)
Spoke with pt. Pt advised to stop Cipro and start Macrobid. Pt voiced understanding. [place]

## 2021-09-09 NOTE — Telephone Encounter (Signed)
Pt is taking Cipro for UTI. Started med on Wednesday. Had 2 doses on Thursday. Symptoms started today after taking med this am. Pt states this am, she started having dry mouth, skin is tingling, feels panicky and stomach was hurting. No trouble breathing. Pt feels anxious and nervous. Pt didn't take med with food.

## 2021-09-23 ENCOUNTER — Other Ambulatory Visit: Payer: Self-pay

## 2021-09-23 ENCOUNTER — Other Ambulatory Visit (INDEPENDENT_AMBULATORY_CARE_PROVIDER_SITE_OTHER): Payer: 59

## 2021-09-23 DIAGNOSIS — R350 Frequency of micturition: Secondary | ICD-10-CM | POA: Diagnosis not present

## 2021-09-23 DIAGNOSIS — Z87440 Personal history of urinary (tract) infections: Secondary | ICD-10-CM

## 2021-09-23 DIAGNOSIS — N390 Urinary tract infection, site not specified: Secondary | ICD-10-CM | POA: Diagnosis not present

## 2021-09-23 LAB — POCT URINALYSIS DIPSTICK
Blood, UA: NEGATIVE
Glucose, UA: NEGATIVE
Ketones, UA: NEGATIVE
Leukocytes, UA: NEGATIVE
Nitrite, UA: NEGATIVE
Protein, UA: NEGATIVE

## 2021-09-23 NOTE — Progress Notes (Addendum)
° °  NURSE VISIT- UTI SYMPTOMS   SUBJECTIVE:  Leah Padilla is a 60 y.o. G32P2002 female here for UTI symptoms. She is a GYN patient. She reports urinary frequency. Recently finished dose of Macrobid last week. Was initially prescribed Cipro, but had reaction to medication.    OBJECTIVE:  There were no vitals taken for this visit.  Appears well, in no apparent distress  Results for orders placed or performed in visit on 09/23/21 (from the past 24 hour(s))  POCT Urinalysis Dipstick   Collection Time: 09/23/21 10:54 AM  Result Value Ref Range   Color, UA     Clarity, UA     Glucose, UA Negative Negative   Bilirubin, UA     Ketones, UA neg    Spec Grav, UA     Blood, UA neg    pH, UA     Protein, UA Negative Negative   Urobilinogen, UA     Nitrite, UA neg    Leukocytes, UA Negative Negative   Appearance     Odor      ASSESSMENT: GYN patient with UTI symptoms and negative nitrites  PLAN: Note routed to Dr. Elonda Husky   Rx sent by provider today: No Urine culture sent.  Patient does not want any medications until culture results.  Call or return to clinic prn if these symptoms worsen or fail to improve as anticipated. Follow-up: as needed   Alice Rieger  09/23/2021 10:54 AM  Attestation of Attending Supervision of Nursing Visit Encounter: Evaluation and management procedures were performed by the nursing staff under my supervision and collaboration.  I have reviewed the nurse's note and chart, and I agree with the management and plan.  Jacelyn Grip MD Attending Physician for the [facility] 11/13/2021 1:21 PM

## 2021-09-27 LAB — URINE CULTURE

## 2021-10-25 ENCOUNTER — Other Ambulatory Visit (INDEPENDENT_AMBULATORY_CARE_PROVIDER_SITE_OTHER): Payer: 59 | Admitting: *Deleted

## 2021-10-25 ENCOUNTER — Other Ambulatory Visit: Payer: Self-pay

## 2021-10-25 DIAGNOSIS — R3915 Urgency of urination: Secondary | ICD-10-CM | POA: Diagnosis not present

## 2021-10-25 DIAGNOSIS — R350 Frequency of micturition: Secondary | ICD-10-CM | POA: Diagnosis not present

## 2021-10-25 LAB — POCT URINALYSIS DIPSTICK
Blood, UA: NEGATIVE
Glucose, UA: NEGATIVE
Ketones, UA: NEGATIVE
Leukocytes, UA: NEGATIVE
Nitrite, UA: NEGATIVE
Protein, UA: NEGATIVE

## 2021-10-25 NOTE — Progress Notes (Signed)
° °  NURSE VISIT- UTI SYMPTOMS   SUBJECTIVE:  Leah Padilla is a 61 y.o. G34P2002 female here for UTI symptoms. She is a GYN patient. She reports urinary frequency and urinary urgency.  OBJECTIVE:  There were no vitals taken for this visit.  Appears well, in no apparent distress  Results for orders placed or performed in visit on 10/25/21 (from the past 24 hour(s))  POCT Urinalysis Dipstick   Collection Time: 10/25/21 11:11 AM  Result Value Ref Range   Color, UA     Clarity, UA     Glucose, UA Negative Negative   Bilirubin, UA     Ketones, UA neg    Spec Grav, UA     Blood, UA neg    pH, UA     Protein, UA Negative Negative   Urobilinogen, UA     Nitrite, UA neg    Leukocytes, UA Negative Negative   Appearance     Odor      ASSESSMENT: GYN patient with UTI symptoms and negative nitrites  PLAN: Note routed to Leah Padilla, AGNP   Rx sent by provider today: No Urine culture sent Call or return to clinic prn if these symptoms worsen or fail to improve as anticipated. Follow-up: as needed   Leah Padilla  10/25/2021 11:18 AM

## 2021-10-26 LAB — MICROSCOPIC EXAMINATION
Bacteria, UA: NONE SEEN
Casts: NONE SEEN /lpf
RBC, Urine: NONE SEEN /hpf (ref 0–2)

## 2021-10-26 LAB — URINALYSIS, ROUTINE W REFLEX MICROSCOPIC
Bilirubin, UA: NEGATIVE
Glucose, UA: NEGATIVE
Ketones, UA: NEGATIVE
Nitrite, UA: NEGATIVE
Protein,UA: NEGATIVE
RBC, UA: NEGATIVE
Specific Gravity, UA: 1.005 (ref 1.005–1.030)
Urobilinogen, Ur: 0.2 mg/dL (ref 0.2–1.0)
pH, UA: 7.5 (ref 5.0–7.5)

## 2021-10-27 ENCOUNTER — Telehealth: Payer: Self-pay | Admitting: *Deleted

## 2021-10-27 LAB — URINE CULTURE

## 2021-10-27 NOTE — Telephone Encounter (Signed)
Left message @ 3:28 pm. JSY

## 2021-10-28 NOTE — Telephone Encounter (Signed)
Pt aware no growth on urine culture and pt states she is feeling fine. [place]

## 2021-12-02 ENCOUNTER — Emergency Department (HOSPITAL_COMMUNITY): Payer: 59

## 2021-12-02 ENCOUNTER — Emergency Department (HOSPITAL_COMMUNITY)
Admission: EM | Admit: 2021-12-02 | Discharge: 2021-12-02 | Disposition: A | Discharge status: Home / Self Care | Payer: 59 | Attending: Emergency Medicine | Admitting: Emergency Medicine

## 2021-12-02 ENCOUNTER — Encounter (HOSPITAL_COMMUNITY): Payer: Self-pay | Admitting: *Deleted

## 2021-12-02 ENCOUNTER — Other Ambulatory Visit: Payer: Self-pay

## 2021-12-02 DIAGNOSIS — G510 Bell's palsy: Secondary | ICD-10-CM | POA: Diagnosis not present

## 2021-12-02 DIAGNOSIS — B309 Viral conjunctivitis, unspecified: Secondary | ICD-10-CM

## 2021-12-02 DIAGNOSIS — B349 Viral infection, unspecified: Secondary | ICD-10-CM

## 2021-12-02 DIAGNOSIS — H1032 Unspecified acute conjunctivitis, left eye: Secondary | ICD-10-CM | POA: Diagnosis not present

## 2021-12-02 DIAGNOSIS — R0981 Nasal congestion: Secondary | ICD-10-CM | POA: Diagnosis present

## 2021-12-02 DIAGNOSIS — E860 Dehydration: Secondary | ICD-10-CM | POA: Diagnosis not present

## 2021-12-02 DIAGNOSIS — Z79899 Other long term (current) drug therapy: Secondary | ICD-10-CM | POA: Diagnosis not present

## 2021-12-02 HISTORY — DX: Pure hypercholesterolemia, unspecified: E78.00

## 2021-12-02 LAB — COMPREHENSIVE METABOLIC PANEL
ALT: 58 U/L — ABNORMAL HIGH (ref 0–44)
AST: 65 U/L — ABNORMAL HIGH (ref 15–41)
Albumin: 3.6 g/dL (ref 3.5–5.0)
Alkaline Phosphatase: 145 U/L — ABNORMAL HIGH (ref 38–126)
Anion gap: 5 (ref 5–15)
BUN: 19 mg/dL (ref 6–20)
CO2: 30 mmol/L (ref 22–32)
Calcium: 12.7 mg/dL — ABNORMAL HIGH (ref 8.9–10.3)
Chloride: 98 mmol/L (ref 98–111)
Creatinine, Ser: 1.73 mg/dL — ABNORMAL HIGH (ref 0.44–1.00)
GFR, Estimated: 34 mL/min — ABNORMAL LOW (ref >60)
Glucose, Bld: 116 mg/dL — ABNORMAL HIGH (ref 70–99)
Potassium: 3.4 mmol/L — ABNORMAL LOW (ref 3.5–5.1)
Sodium: 133 mmol/L — ABNORMAL LOW (ref 135–145)
Total Bilirubin: 0.5 mg/dL (ref 0.3–1.2)
Total Protein: 8.4 g/dL — ABNORMAL HIGH (ref 6.5–8.1)

## 2021-12-02 LAB — CBC WITH DIFFERENTIAL/PLATELET
Abs Immature Granulocytes: 0.04 10*3/uL (ref 0.00–0.07)
Basophils Absolute: 0.1 10*3/uL (ref 0.0–0.1)
Basophils Relative: 1 %
Eosinophils Absolute: 0.2 10*3/uL (ref 0.0–0.5)
Eosinophils Relative: 3 %
HCT: 35.7 % — ABNORMAL LOW (ref 36.0–46.0)
Hemoglobin: 11.6 g/dL — ABNORMAL LOW (ref 12.0–15.0)
Immature Granulocytes: 1 %
Lymphocytes Relative: 19 %
Lymphs Abs: 1.4 10*3/uL (ref 0.7–4.0)
MCH: 27.2 pg (ref 26.0–34.0)
MCHC: 32.5 g/dL (ref 30.0–36.0)
MCV: 83.6 fL (ref 80.0–100.0)
Monocytes Absolute: 1 10*3/uL (ref 0.1–1.0)
Monocytes Relative: 13 %
Neutro Abs: 4.9 10*3/uL (ref 1.7–7.7)
Neutrophils Relative %: 63 %
Platelets: 350 10*3/uL (ref 150–400)
RBC: 4.27 MIL/uL (ref 3.87–5.11)
RDW: 12.7 % (ref 11.5–15.5)
WBC: 7.7 10*3/uL (ref 4.0–10.5)
nRBC: 0 % (ref 0.0–0.2)

## 2021-12-02 MED ORDER — TOBRAMYCIN-DEXAMETHASONE 0.3-0.1 % OP SUSP: 2.0000 [drp] | OPHTHALMIC | 0 refills | Status: DC

## 2021-12-02 MED ORDER — PREDNISONE 10 MG PO TABS: ORAL_TABLET | ORAL | 0 refills | Status: DC

## 2021-12-02 NOTE — ED Provider Notes (Signed)
?[place] ?Provider Note ? ? ?CSN: [id number] ?Arrival date & time: 12/02/21  1503 ? ?  ? ?History ? ?Chief Complaint  ?Patient presents with  ? Weakness  ? ? ?Leah Padilla is a 61 y.o. female. ?I evaluated the patient at 1520, while at triage ?HPI ?She presents primarily for new symptom weakness of her left eye and face which she noticed around 230 today.  She has been ill for about 5 days with general fatigue, then noticed sinus congestion and pain behind her left eye, 2 days ago.  She denies fever, cough, shortness of breath, nausea or vomiting.  She does not have any symptoms of her arms or legs.  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 ?  ? ?Home Medications ?Prior to Admission medications   ?Medication Sig Start Date End Date Taking? Authorizing Provider  ?predniSONE (DELTASONE) 10 MG tablet Take q day 6,5,4,3,2,1 12/02/21  Yes Daleen Bo, MD  ?tobramycin-dexamethasone [facility]) ophthalmic solution Place 2 drops into the left eye every 4 (four) hours while awake. 12/02/21  Yes Daleen Bo, MD  ?ALPRAZolam Duanne Moron) 1 MG tablet Take 1 mg by mouth 4 (four) times daily as needed for anxiety.  09/08/15   [provider]  ?imipramine (TOFRANIL) 25 MG tablet Take 25 mg by mouth at bedtime.    [provider]  ?losartan (COZAAR) 50 MG tablet Take 50 mg by mouth daily.    [provider]  ?phenazopyridine (PYRIDIUM) 200 MG tablet Take 1 tid for 3 days ?Patient not taking: Reported on 09/02/2021 08/03/21   Derrek Monaco A, NP  ?simvastatin (ZOCOR) 20 MG tablet Take 20 mg by mouth daily.    [provider]  ?   ? ?Allergies    ?Ciprofloxacin, Doxycycline, Estrace [estradiol], Penicillins, Polymox [amoxicillin], and Progesterone   ? ?Review of Systems   ?Review of Systems ? ?Physical Exam ?Updated Vital Signs ?BP (!) 154/108 (BP Location: Left Arm)   Pulse 100   Resp 18   Ht 5' 9"  (1.753 m)   Wt 77.1 kg   SpO2 96%   BMI 25.10 kg/m?  ?Physical  Exam ?Vitals and nursing note reviewed.  ?Constitutional:   ?   Appearance: She is well-developed. She is not ill-appearing.  ?HENT:  ?   Head: Normocephalic and atraumatic.  ?   Right Ear: External ear normal.  ?   Left Ear: External ear normal.  ?   Mouth/Throat:  ?   Mouth: Mucous membranes are moist.  ?   Pharynx: No oropharyngeal exudate or posterior oropharyngeal erythema.  ?Eyes:  ?   General:     ?   Left eye: Discharge (Mild conjunctival erythema and clear drainage noticed from the left eye.) present. ?   Pupils: Pupils are equal, round, and reactive to light.  ?Neck:  ?   Trachea: Phonation normal.  ?Cardiovascular:  ?   Rate and Rhythm: Normal rate and regular rhythm.  ?   Heart sounds: Normal heart sounds.  ?Pulmonary:  ?   Effort: Pulmonary effort is normal.  ?   Breath sounds: Normal breath sounds.  ?Abdominal:  ?   Palpations: Abdomen is soft.  ?   Tenderness: There is no abdominal tenderness.  ?Musculoskeletal:     ?   General: Normal range of motion.  ?   Cervical back: Normal range of motion and neck supple.  ?Skin: ?   General: Skin is warm and dry.  ?Neurological:  ?  Mental Status: She is alert and oriented to person, place, and time.  ?   Cranial Nerves: No cranial nerve deficit.  ?   Sensory: No sensory deficit.  ?   Motor: No abnormal muscle tone.  ?   Coordination: Coordination normal.  ?   Comments: No dysarthria, aphasia or nystagmus.  Left midface weakness, nasolabial fold and altered smile.  No other cranial nerve deficit noted.  Normal strength arms and legs bilaterally.  ?Psychiatric:     ?   Mood and Affect: Mood normal.     ?   Behavior: Behavior normal.     ?   Thought Content: Thought content normal.     ?   Judgment: Judgment normal.  ? ? ?ED Results / Procedures / Treatments   ?Labs ?(all labs ordered are listed, but only abnormal results are displayed) ?Labs Reviewed  ?COMPREHENSIVE METABOLIC PANEL - Abnormal; Notable for the following components:  ?    Result Value  ? Sodium  133 (*)   ? Potassium 3.4 (*)   ? Glucose, Bld 116 (*)   ? Creatinine, Ser 1.73 (*)   ? Calcium 12.7 (*)   ? Total Protein 8.4 (*)   ? AST 65 (*)   ? ALT 58 (*)   ? Alkaline Phosphatase 145 (*)   ? GFR, Estimated 34 (*)   ? All other components within normal limits  ?CBC WITH DIFFERENTIAL/PLATELET - Abnormal; Notable for the following components:  ? Hemoglobin 11.6 (*)   ? HCT 35.7 (*)   ? All other components within normal limits  ? ? ?EKG ?None ? ?Radiology ?CT Head Wo Contrast ? ?Result Date: 12/02/2021 ?CLINICAL DATA:  Generalized weakness since Monday, head congestion, runny nose, left mouth droop EXAM: CT HEAD WITHOUT CONTRAST TECHNIQUE: Contiguous axial images were obtained from the base of the skull through the vertex without intravenous contrast. RADIATION DOSE REDUCTION: This exam was performed according to the departmental dose-optimization program which includes automated exposure control, adjustment of the mA and/or kV according to patient size and/or use of iterative reconstruction technique. COMPARISON:  None. FINDINGS: Brain: There is no evidence of acute intracranial hemorrhage, extra-axial fluid collection, or acute infarct. Parenchymal volume is normal. The ventricles are normal in size. Gray-white differentiation is preserved. There is no mass lesion. There is no mass effect or midline shift. Vascular: No hyperdense vessel or unexpected calcification. Skull: Normal. Negative for fracture or focal lesion. Sinuses/Orbits: The imaged paranasal sinuses are clear. The globes and orbits are unremarkable. Other: None. IMPRESSION: No acute intracranial pathology. Electronically Signed   By: Valetta Mole M.D.   On: 12/02/2021 16:08   ? ?Procedures ?Procedures  ? ? ?Medications Ordered in ED ?Medications - No data to display ? ?ED Course/ Medical Decision Making/ A&P ?  ?                        ?Medical Decision Making ?Patient seen by me at 3:15 PM.  Patient was evaluated in a vertical treatment room.  At  this time she has new onset weakness of the left face.  She appears to have Bell's palsy.  Screening evaluation ordered. ? ?Amount and/or Complexity of Data Reviewed ?Independent Historian:  ?   Details: She is a cogent historian ?Labs: ordered. ?   Details: CBC, metabolic panel-normal except sodium low, potassium low, glucose high, creatinine high, calcium high, total protein high, AST high, ALT high, alk phos stays high, GFR  low, hemoglobin low ?Radiology: ordered and independent interpretation performed. ?   Details: See CT head-no CVA, intracranial tumor, or apparent sinusitis. ?Discussion of management or test interpretation with external provider(s): Patient with malaise, and nonspecific symptoms, ongoing for 5 days followed by facial weakness left.  No other focal nervous system abnormalities.  Screening evaluation indicates dehydration and mild AKI with nonspecific LFT abnormalities.  No clear cause for left eye drainage, consider viral versus allergic as etiologies.  Doubt bacterial infection or bacterial sinusitis at this time.  Overall, she appears to have a viral process, causing malaise, decreased oral intake, and secondary symptoms of left eye drainage.  This been complicated by onset of Bell's palsy today.  No evidence for Ramsay Hunt syndrome.  Will prescribe ophthalmologic eye preparation and prednisone to treat symptoms.  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  We will keep her out of work until she sees her PCP next week, recommend labs at that time, and reassess Bell's palsy. ? ? ? ? ? ? ? ? ? ? ?Final Clinical Impression(s) / ED Diagnoses ?Final diagnoses:  ?Bell's palsy  ?Viral syndrome  ?Acute viral conjunctivitis of left eye  ?Dehydration  ? ? ?Rx / DC Orders ?ED Discharge Orders   ? ?      Ordered  ?  tobramycin-dexamethasone (TOBRADEX) ophthalmic solution  Every 4 hours while awake       ? 12/02/21 1751  ?  predniSONE (DELTASONE) 10 MG tablet        ? 12/02/21 1751  ? ?  ?  ? ?  ? ? ?  ?Daleen Bo, MD ?12/02/21 1752 ? ?

## 2021-12-02 NOTE — Discharge Instructions (Addendum)
It appears that you have Bell's palsy causing weakness of the left side of the face and difficulty closing her left eye.  We have prescribed prednisone to help this get better more quickly.  Protect the eye by taping it shut at nighttime.  We are treating you for conjunctivitis, and that should be adequate additional moisturization for your eye.  Try to drink extra fluids since you are somewhat dehydrated at this time.  Drink 1 to 2 L of water in addition to your usual fluid intake.  Return here if your condition worsens. ?

## 2021-12-02 NOTE — ED Triage Notes (Signed)
Pt c/o generalized weakness since Monday, head congestion and runny nose since Wednesday, left eye draining that started today. Pt reports today at 1430 she noticed the left side of her mouth drooping.  ? ?Dr. Eulis Foster called in triage to evaluate pt due to symptoms.  ?

## 2022-01-13 ENCOUNTER — Emergency Department (HOSPITAL_COMMUNITY): Payer: 59

## 2022-01-13 ENCOUNTER — Other Ambulatory Visit: Payer: Self-pay

## 2022-01-13 ENCOUNTER — Inpatient Hospital Stay (HOSPITAL_COMMUNITY)
Admission: EM | Admit: 2022-01-13 | Discharge: 2022-01-19 | DRG: 628 | Disposition: A | Discharge status: Home / Self Care | Payer: 59 | Attending: Internal Medicine | Admitting: Internal Medicine

## 2022-01-13 ENCOUNTER — Encounter (HOSPITAL_COMMUNITY): Payer: Self-pay | Admitting: Emergency Medicine

## 2022-01-13 DIAGNOSIS — Z6828 Body mass index (BMI) 28.0-28.9, adult: Secondary | ICD-10-CM

## 2022-01-13 DIAGNOSIS — E860 Dehydration: Secondary | ICD-10-CM | POA: Diagnosis present

## 2022-01-13 DIAGNOSIS — Z880 Allergy status to penicillin: Secondary | ICD-10-CM

## 2022-01-13 DIAGNOSIS — E669 Obesity, unspecified: Secondary | ICD-10-CM | POA: Diagnosis present

## 2022-01-13 DIAGNOSIS — F419 Anxiety disorder, unspecified: Secondary | ICD-10-CM | POA: Diagnosis present

## 2022-01-13 DIAGNOSIS — N179 Acute kidney failure, unspecified: Secondary | ICD-10-CM | POA: Diagnosis present

## 2022-01-13 DIAGNOSIS — E878 Other disorders of electrolyte and fluid balance, not elsewhere classified: Secondary | ICD-10-CM

## 2022-01-13 DIAGNOSIS — N1832 Chronic kidney disease, stage 3b: Secondary | ICD-10-CM | POA: Diagnosis present

## 2022-01-13 DIAGNOSIS — I10 Essential (primary) hypertension: Secondary | ICD-10-CM | POA: Diagnosis present

## 2022-01-13 DIAGNOSIS — Z888 Allergy status to other drugs, medicaments and biological substances status: Secondary | ICD-10-CM

## 2022-01-13 DIAGNOSIS — G510 Bell's palsy: Secondary | ICD-10-CM | POA: Diagnosis present

## 2022-01-13 DIAGNOSIS — R112 Nausea with vomiting, unspecified: Secondary | ICD-10-CM | POA: Diagnosis present

## 2022-01-13 DIAGNOSIS — R590 Localized enlarged lymph nodes: Secondary | ICD-10-CM | POA: Diagnosis present

## 2022-01-13 DIAGNOSIS — Z79899 Other long term (current) drug therapy: Secondary | ICD-10-CM

## 2022-01-13 DIAGNOSIS — Z87891 Personal history of nicotine dependence: Secondary | ICD-10-CM | POA: Diagnosis not present

## 2022-01-13 DIAGNOSIS — E7800 Pure hypercholesterolemia, unspecified: Secondary | ICD-10-CM | POA: Diagnosis present

## 2022-01-13 DIAGNOSIS — G9341 Metabolic encephalopathy: Secondary | ICD-10-CM | POA: Diagnosis present

## 2022-01-13 DIAGNOSIS — E876 Hypokalemia: Secondary | ICD-10-CM | POA: Diagnosis present

## 2022-01-13 DIAGNOSIS — Z8669 Personal history of other diseases of the nervous system and sense organs: Secondary | ICD-10-CM

## 2022-01-13 DIAGNOSIS — I129 Hypertensive chronic kidney disease with stage 1 through stage 4 chronic kidney disease, or unspecified chronic kidney disease: Secondary | ICD-10-CM | POA: Diagnosis present

## 2022-01-13 DIAGNOSIS — E785 Hyperlipidemia, unspecified: Secondary | ICD-10-CM | POA: Diagnosis present

## 2022-01-13 DIAGNOSIS — R591 Generalized enlarged lymph nodes: Secondary | ICD-10-CM | POA: Diagnosis present

## 2022-01-13 DIAGNOSIS — Z881 Allergy status to other antibiotic agents status: Secondary | ICD-10-CM | POA: Diagnosis not present

## 2022-01-13 HISTORY — DX: Depression, unspecified: F32.A

## 2022-01-13 HISTORY — DX: Bell's palsy: G51.0

## 2022-01-13 LAB — URINALYSIS, ROUTINE W REFLEX MICROSCOPIC
Bilirubin Urine: NEGATIVE
Glucose, UA: NEGATIVE mg/dL
Hgb urine dipstick: NEGATIVE
Ketones, ur: 5 mg/dL — AB
Nitrite: NEGATIVE
Protein, ur: 30 mg/dL — AB
Specific Gravity, Urine: 1.01 (ref 1.005–1.030)
pH: 7 (ref 5.0–8.0)

## 2022-01-13 LAB — BASIC METABOLIC PANEL
Anion gap: 9 (ref 5–15)
BUN: 24 mg/dL — ABNORMAL HIGH (ref 6–20)
CO2: 31 mmol/L (ref 22–32)
Calcium: 13.4 mg/dL (ref 8.9–10.3)
Chloride: 93 mmol/L — ABNORMAL LOW (ref 98–111)
Creatinine, Ser: 2.43 mg/dL — ABNORMAL HIGH (ref 0.44–1.00)
GFR, Estimated: 22 mL/min — ABNORMAL LOW (ref >60)
Glucose, Bld: 113 mg/dL — ABNORMAL HIGH (ref 70–99)
Potassium: 2.7 mmol/L — CL (ref 3.5–5.1)
Sodium: 133 mmol/L — ABNORMAL LOW (ref 135–145)

## 2022-01-13 LAB — CBC
HCT: 38.1 % (ref 36.0–46.0)
Hemoglobin: 13.1 g/dL (ref 12.0–15.0)
MCH: 29 pg (ref 26.0–34.0)
MCHC: 34.4 g/dL (ref 30.0–36.0)
MCV: 84.5 fL (ref 80.0–100.0)
Platelets: 572 10*3/uL — ABNORMAL HIGH (ref 150–400)
RBC: 4.51 MIL/uL (ref 3.87–5.11)
RDW: 15 % (ref 11.5–15.5)
WBC: 9.8 10*3/uL (ref 4.0–10.5)
nRBC: 0 % (ref 0.0–0.2)

## 2022-01-13 LAB — MAGNESIUM: Magnesium: 2.2 mg/dL (ref 1.7–2.4)

## 2022-01-13 LAB — PHOSPHORUS: Phosphorus: 1.9 mg/dL — ABNORMAL LOW (ref 2.5–4.6)

## 2022-01-13 LAB — TSH: TSH: 0.385 u[IU]/mL (ref 0.350–4.500)

## 2022-01-13 LAB — RAPID URINE DRUG SCREEN, HOSP PERFORMED
Amphetamines: NOT DETECTED
Barbiturates: NOT DETECTED
Benzodiazepines: POSITIVE — AB
Cocaine: NOT DETECTED
Opiates: NOT DETECTED
Tetrahydrocannabinol: POSITIVE — AB

## 2022-01-13 LAB — HIV ANTIBODY (ROUTINE TESTING W REFLEX): HIV Screen 4th Generation wRfx: NONREACTIVE

## 2022-01-13 MED ORDER — ONDANSETRON HCL 4 MG PO TABS: 4.0000 mg | ORAL_TABLET | Freq: Four times a day (QID) | ORAL | Status: DC | PRN

## 2022-01-13 MED ORDER — SODIUM CHLORIDE 0.9 % IV SOLN: 250.0000 mL | INTRAVENOUS | Status: DC | PRN

## 2022-01-13 MED ORDER — HYDROCODONE-ACETAMINOPHEN 5-325 MG PO TABS
1.0000 | ORAL_TABLET | ORAL | Status: DC | PRN
  Administered 2022-01-17 – 2022-01-18 (×2): 2 via ORAL
  Filled 2022-01-13: qty 2
  Filled 2022-01-13 (×2): qty 1

## 2022-01-13 MED ORDER — POTASSIUM CHLORIDE 10 MEQ/100ML IV SOLN
10.0000 meq | Freq: Once | INTRAVENOUS | Status: AC
Start: 2022-01-13 — End: 2022-01-13
  Administered 2022-01-13: 10 meq via INTRAVENOUS
  Filled 2022-01-13: qty 100

## 2022-01-13 MED ORDER — SODIUM CHLORIDE 0.9 % IV BOLUS
1000.0000 mL | Freq: Once | INTRAVENOUS | Status: AC
  Administered 2022-01-13: 1000 mL via INTRAVENOUS

## 2022-01-13 MED ORDER — ENOXAPARIN SODIUM 30 MG/0.3ML IJ SOSY
30.0000 mg | PREFILLED_SYRINGE | INTRAMUSCULAR | Status: DC
  Administered 2022-01-14 – 2022-01-15 (×2): 30 mg via SUBCUTANEOUS
  Filled 2022-01-13 (×2): qty 0.3

## 2022-01-13 MED ORDER — IMIPRAMINE HCL 25 MG PO TABS
25.0000 mg | ORAL_TABLET | Freq: Every day | ORAL | Status: DC
Start: 2022-01-13 — End: 2022-01-19
  Administered 2022-01-13 – 2022-01-18 (×6): 25 mg via ORAL
  Filled 2022-01-13 (×9): qty 1

## 2022-01-13 MED ORDER — MORPHINE SULFATE (PF) 2 MG/ML IV SOLN
2.0000 mg | INTRAVENOUS | Status: DC | PRN
  Administered 2022-01-17: 2 mg via INTRAVENOUS
  Filled 2022-01-13: qty 1

## 2022-01-13 MED ORDER — POTASSIUM CHLORIDE CRYS ER 20 MEQ PO TBCR
40.0000 meq | EXTENDED_RELEASE_TABLET | Freq: Once | ORAL | Status: AC
Start: 2022-01-13 — End: 2022-01-13
  Administered 2022-01-13: 40 meq via ORAL
  Filled 2022-01-13: qty 2

## 2022-01-13 MED ORDER — ENOXAPARIN SODIUM 40 MG/0.4ML IJ SOSY: 40.0000 mg | PREFILLED_SYRINGE | INTRAMUSCULAR | Status: DC

## 2022-01-13 MED ORDER — ACETAMINOPHEN 650 MG RE SUPP: 650.0000 mg | Freq: Four times a day (QID) | RECTAL | Status: DC | PRN

## 2022-01-13 MED ORDER — SODIUM CHLORIDE 0.9% FLUSH
3.0000 mL | Freq: Two times a day (BID) | INTRAVENOUS | Status: DC
  Administered 2022-01-13 – 2022-01-19 (×10): 3 mL via INTRAVENOUS

## 2022-01-13 MED ORDER — ENSURE ENLIVE PO LIQD
237.0000 mL | Freq: Two times a day (BID) | ORAL | Status: DC
  Administered 2022-01-14 – 2022-01-18 (×7): 237 mL via ORAL

## 2022-01-13 MED ORDER — METOPROLOL TARTRATE 5 MG/5ML IV SOLN
5.0000 mg | Freq: Four times a day (QID) | INTRAVENOUS | Status: DC | PRN
  Administered 2022-01-13: 5 mg via INTRAVENOUS
  Filled 2022-01-13: qty 5

## 2022-01-13 MED ORDER — ONDANSETRON HCL 4 MG/2ML IJ SOLN
4.0000 mg | Freq: Four times a day (QID) | INTRAMUSCULAR | Status: DC | PRN
  Administered 2022-01-16 (×2): 4 mg via INTRAVENOUS
  Filled 2022-01-13 (×3): qty 2

## 2022-01-13 MED ORDER — POLYETHYLENE GLYCOL 3350 17 G PO PACK: 17.0000 g | PACK | Freq: Every day | ORAL | Status: DC | PRN

## 2022-01-13 MED ORDER — ALPRAZOLAM 1 MG PO TABS
1.0000 mg | ORAL_TABLET | Freq: Four times a day (QID) | ORAL | Status: DC | PRN
  Filled 2022-01-13: qty 1

## 2022-01-13 MED ORDER — ACETAMINOPHEN 325 MG PO TABS
650.0000 mg | ORAL_TABLET | Freq: Four times a day (QID) | ORAL | Status: DC | PRN
  Administered 2022-01-14 – 2022-01-17 (×3): 650 mg via ORAL
  Filled 2022-01-13 (×3): qty 2

## 2022-01-13 MED ORDER — SODIUM CHLORIDE 0.9% FLUSH: 3.0000 mL | INTRAVENOUS | Status: DC | PRN

## 2022-01-13 NOTE — ED Notes (Signed)
Patient transported to CT 

## 2022-01-13 NOTE — ED Provider Notes (Signed)
?Christine ?Provider Note ? ? ?CSN: [id number] ?Arrival date & time: 01/13/22  1033 ? ?  ? ?History ? ?Chief Complaint  ?Patient presents with  ? Hypertension  ? ? ?Leah Padilla is a 61 y.o. female. ? ?HPI ? ?Patient with medical history including hypertension, hyperlipidemia, Bell's palsy presents with complaints of generalized weakness.  Patient states that she has been not feeling well since she was diagnosed with Bell's palsy about 1 to month ago.  She states that after she finished her steroids and her antiviral treatment she started to feel weak, she states she will have occasional difficulty with word finding but not slurred her words, she then will have occasional weakness which is not unilateral , the difficulty word finding and weakness did not occur simultaneously.  She denies any recent head trauma, not on anticoag's, she denies any fevers chills chest pain shortness of breath general body aches no stomach pains nausea vomiting diarrhea.  She states she is never had this in the past, she denies any leaving or aggravating factors. ? ?Patient son was at bedside able to validate the story, she endorses that she will occasionally confuse words last about a second and resolved, states the patient has been more fatigued lately and yesterday she would not get out of bed, he states that she has a decrease in appetite but states she has been constantly drinking water. ? ?Home Medications ?Prior to Admission medications   ?Medication Sig Start Date End Date Taking? Authorizing Provider  ?ALPRAZolam (XANAX) 1 MG tablet Take 1 mg by mouth 4 (four) times daily as needed for anxiety.  09/08/15  Yes [provider]  ?imipramine (TOFRANIL) 25 MG tablet Take 25 mg by mouth at bedtime.   Yes [provider]  ?losartan (COZAAR) 50 MG tablet Take 50 mg by mouth daily.   Yes [provider]  ?Multiple Vitamin (MULTIVITAMIN) tablet Take 1 tablet by mouth daily.   Yes [provider]  ?simvastatin (ZOCOR) 20 MG tablet Take 20 mg by mouth daily.   Yes [provider]  ?phenazopyridine (PYRIDIUM) 200 MG tablet Take 1 tid for 3 days ?Patient not taking: Reported on 09/02/2021 08/03/21   Derrek Monaco A, NP  ?predniSONE (DELTASONE) 10 MG tablet Take q day 6,5,4,3,2,1 ?Patient not taking: Reported on 01/13/2022 12/02/21   Daleen Bo, MD  ?tobramycin-dexamethasone [facility]) ophthalmic solution Place 2 drops into the left eye every 4 (four) hours while awake. ?Patient not taking: Reported on 01/13/2022 12/02/21   Daleen Bo, MD  ?   ? ?Allergies    ?Ciprofloxacin, Doxycycline, Estrace [estradiol], Penicillins, Polymox [amoxicillin], and Progesterone   ? ?Review of Systems   ?Review of Systems  ?Constitutional:  Positive for fatigue. Negative for chills and fever.  ?Respiratory:  Negative for shortness of breath.   ?Cardiovascular:  Negative for chest pain.  ?Gastrointestinal:  Negative for abdominal pain.  ?Neurological:  Negative for headaches.  ? ?Physical Exam ?Updated Vital Signs ?BP (!) 180/96   Pulse 85   Temp 98.1 ?F (36.7 ?C) (Oral)   Resp 16   SpO2 96%  ?Physical Exam ?Vitals and nursing note reviewed.  ?Constitutional:   ?   General: She is not in acute distress. ?   Appearance: She is not ill-appearing.  ?HENT:  ?   Head: Normocephalic and atraumatic.  ?   Nose: No congestion.  ?   Mouth/Throat:  ?   Mouth: Mucous membranes are dry.  ?  Pharynx: Oropharynx is clear. No oropharyngeal exudate or posterior oropharyngeal erythema.  ?Eyes:  ?   Extraocular Movements: Extraocular movements intact.  ?   Conjunctiva/sclera: Conjunctivae normal.  ?   Pupils: Pupils are equal, round, and reactive to light.  ?Cardiovascular:  ?   Rate and Rhythm: Normal rate and regular rhythm.  ?   Pulses: Normal pulses.  ?   Heart sounds: No murmur heard. ?  No friction rub. No gallop.  ?Pulmonary:  ?   Effort: No respiratory distress.  ?   Breath sounds: No wheezing, rhonchi or  rales.  ?Abdominal:  ?   Palpations: Abdomen is soft.  ?   Tenderness: There is no abdominal tenderness. There is no right CVA tenderness or left CVA tenderness.  ?Musculoskeletal:  ?   Comments: Moving all 4 extremities.   ?Skin: ?   General: Skin is warm and dry.  ?Neurological:  ?   Mental Status: She is alert.  ?   Cranial Nerves: Facial asymmetry present.  ?   Sensory: Sensation is intact.  ?   Motor: No weakness.  ?   Coordination: Romberg sign negative. Finger-Nose-Finger Test and Heel to [place] Test normal.  ?   Gait: Gait is intact. Gait normal.  ?   Comments: Has noted right-sided facial droop, unable to wrinkle the forehead with right-sided droop at the corner of the mouth, while the cranial nerves are fully intact, no unilateral weakness, no difficulty word finding, able to identify basic objects like a pen or a cell phone, alert and orient x4, gait fully intact.  ?Psychiatric:     ?   Mood and Affect: Mood normal.  ? ? ?ED Results / Procedures / Treatments   ?Labs ?(all labs ordered are listed, but only abnormal results are displayed) ?Labs Reviewed  ?BASIC METABOLIC PANEL - Abnormal; Notable for the following components:  ?    Result Value  ? Sodium 133 (*)   ? Potassium 2.7 (*)   ? Chloride 93 (*)   ? Glucose, Bld 113 (*)   ? BUN 24 (*)   ? Creatinine, Ser 2.43 (*)   ? Calcium 13.4 (*)   ? GFR, Estimated 22 (*)   ? All other components within normal limits  ?CBC - Abnormal; Notable for the following components:  ? Platelets 572 (*)   ? All other components within normal limits  ?MAGNESIUM  ?URINALYSIS, ROUTINE W REFLEX MICROSCOPIC  ?RAPID URINE DRUG SCREEN, HOSP PERFORMED  ? ? ?EKG ?EKG Interpretation ? ?Date/Time:  Friday January 13 2022 14:09:49 EDT ?Ventricular Rate:  89 ?PR Interval:  51 ?QRS Duration: 119 ?QT Interval:  375 ?QTC Calculation: 457 ?R Axis:   30 ?Text Interpretation: Sinus rhythm Short PR interval Consider right atrial enlargement Incomplete left bundle branch block Anterior Q waves,  possibly due to ILBBB Artifact in lead(s) I II aVR aVL aVF V2 since last tracing no significant change Confirmed by Daleen Bo [phone]) on 01/13/2022 4:02:10 PM ? ?Radiology ?CT Head Wo Contrast ? ?Result Date: 01/13/2022 ?CLINICAL DATA:  Altered mental status EXAM: CT HEAD WITHOUT CONTRAST TECHNIQUE: Contiguous axial images were obtained from the base of the skull through the vertex without intravenous contrast. RADIATION DOSE REDUCTION: This exam was performed according to the departmental dose-optimization program which includes automated exposure control, adjustment of the mA and/or kV according to patient size and/or use of iterative reconstruction technique. COMPARISON:  12/02/2021 FINDINGS: Brain: No acute intracranial findings are seen. Cortical sulci are prominent.  Ventricles are not dilated. There is no shift of midline structures. There are no epidural or subdural fluid collections. Vascular: Unremarkable. Skull: No fracture is seen in the calvarium. Sinuses/Orbits: Mucous retention cyst is seen in the left side of sphenoid sinus. Other: None IMPRESSION: No acute intracranial findings are seen in noncontrast CT brain. Mild chronic sphenoid sinusitis. Electronically Signed   By: Elmer Picker M.D.   On: 01/13/2022 14:55   ? ?Procedures ?Procedures  ? ? ?Medications Ordered in ED ?Medications  ?potassium chloride 10 mEq in 100 mL IVPB (10 mEq Intravenous New Bag/Given 01/13/22 1641)  ?sodium chloride 0.9 % bolus 1,000 mL (0 mLs Intravenous Stopped 01/13/22 1620)  ?potassium chloride SA (KLOR-CON M) CR tablet 40 mEq (40 mEq Oral Given 01/13/22 1423)  ?sodium chloride 0.9 % bolus 1,000 mL (1,000 mLs Intravenous New Bag/Given 01/13/22 1640)  ? ? ?ED Course/ Medical Decision Making/ A&P ?  ?                        ?Medical Decision Making ?Amount and/or Complexity of Data Reviewed ?Labs: ordered. ?Radiology: ordered. ? ?Risk ?Prescription drug management. ?Decision regarding hospitalization. ? ? ?This  patient presents to the ED for concern of weakness intermittent confusion, this involves an extensive number of treatment options, and is a complaint that carries with it a high risk of complications and morbidity.

## 2022-01-13 NOTE — ED Triage Notes (Signed)
Pt here about two weeks ago and dx with bells palsy, here with son who she lives with. Ambulatory. Son states pt has gotten weaker, forgetful and confused since. Denies gu sx. Denies v/d. Pt c/o some nausea. Left sided facial droop noted and unable to close left eye. Loss of appetite.  ?

## 2022-01-13 NOTE — H&P (Signed)
?History and Physical  ? ? ?Leah Padilla YIF:[id number] DOB: 06-18-1961 DOA: 01/13/2022 ? ?PCP: Redmond School, MD  ?Patient coming from: Home ? ?I have personally briefly reviewed patient's old medical records in [place] ? ?Chief Complaint: Weakness forgetful and confused worsening over the past 6 weeks. ? ?HPI: Leah Padilla is a 61 y.o. female with medical history significant of hypertension, Bell's palsy, and hypercholesterolemia who presents to the emergency department at the behest of her son who lives with her.  He states the patient has gotten weaker and forgetful and confused patient complains of some nausea.  On arrival.  When I saw her patient was oriented but complains of feeling weak and tired.  Patient feels like she has been feeling weak and tired since she was diagnosed with Bell's palsy 6 weeks ago.  At that time she was seen in the emergency department and she had a slightly elevated calcium and mild hypokalemia.  Bell's palsy was addressed but the patient did not improve.  She was due to see Dr. Gerarda Fraction a week after evaluation in the emergency department but it does not appear she was able to make that visit.  She has continued to feel poorly.  Has occasional difficulty with word finding but no slurred speech.  Has some generalized weakness.  No history of head trauma, fevers, chills, chest pain, shortness of breath, general body aches.  No stomach pains, nausea, vomiting or diarrhea.  This has never happened to her before.  She has no worsening or improving symptoms. ? ?ED Course: Patient fully evaluated in the emergency department exam consistent with some confusion and some lethargy but she was otherwise oriented and able to name objects.  [place] data revealed a potassium of 2.7 which is decreased from March 3 at 3.4.  A calcium of 13.4 which is markedly elevated and increased from March 3 where it was 12.7.  Creatinine 2.43 on March 30 it was 1.73.  GFR 22 decreased from previously  34.  CT scan of the head was unremarkable.  Given 2 boluses of IV fluid.  Patient then referred to me for further evaluation and management. ? ?Review of Systems: As per HPI otherwise all other systems reviewed and  negative.  ? ?Past Medical History:  ?Diagnosis Date  ? Anxiety   ? Bell's palsy   ? Depression   ? High cholesterol   ? Hypertension   ? ? ?Past Surgical History:  ?Procedure Laterality Date  ? COLONOSCOPY N/A 04/17/2013  ? Procedure: COLONOSCOPY;  Surgeon: Rogene Houston, MD;  Location: [facility];  Service: Endoscopy;  Laterality: N/A;  930  ? CYST EXCISION    ? neck  ? ? ?Social History  ? ?Social History Narrative  ? Not on file  ? ? ? reports that she quit smoking about 5 years ago. Her smoking use included cigarettes. She has a 6.50 pack-year smoking history. She has never used smokeless tobacco. She reports that she does not currently use alcohol. She reports that she does not use drugs. ? ?Allergies  ?Allergen Reactions  ? Ciprofloxacin Other (See Comments)  ?  Dry mouth, skin tingling, panicky, stomach pain, anxious and nervous  ? Doxycycline   ? Estrace [Estradiol]   ? Penicillins   ? Polymox [Amoxicillin] Hives  ? Progesterone   ? ? ?Family History  ?Problem Relation Age of Onset  ? Stroke Other   ? Diabetes Other   ? Stroke Father   ? Breast cancer  Mother   ? Breast cancer Sister   ? ? ? ?Prior to Admission medications   ?Medication Sig Start Date End Date Taking? Authorizing Provider  ?ALPRAZolam (XANAX) 1 MG tablet Take 1 mg by mouth 4 (four) times daily as needed for anxiety.  09/08/15  Yes [provider]  ?imipramine (TOFRANIL) 25 MG tablet Take 25 mg by mouth at bedtime.   Yes [provider]  ?losartan (COZAAR) 50 MG tablet Take 50 mg by mouth daily.   Yes [provider]  ?Multiple Vitamin (MULTIVITAMIN) tablet Take 1 tablet by mouth daily.   Yes [provider]  ?simvastatin (ZOCOR) 20 MG tablet Take 20 mg by mouth daily.   Yes [provider]  ?phenazopyridine (PYRIDIUM) 200 MG tablet Take 1 tid for 3 days ?Patient not taking: Reported on 09/02/2021 08/03/21   Derrek Monaco A, NP  ?predniSONE (DELTASONE) 10 MG tablet Take q day 6,5,4,3,2,1 ?Patient not taking: Reported on 01/13/2022 12/02/21   Daleen Bo, MD  ?tobramycin-dexamethasone [facility]) ophthalmic solution Place 2 drops into the left eye every 4 (four) hours while awake. ?Patient not taking: Reported on 01/13/2022 12/02/21   Daleen Bo, MD  ? ? ?Physical Exam: ? ?Constitutional: NAD, calm, comfortable ?Vitals:  ? 01/13/22 1700 01/13/22 1730 01/13/22 1747 01/13/22 1807  ?BP: (!) 153/84 (!) 175/100  (!) 187/101  ?Pulse: 77 79  83  ?Resp: '19 10  18  '$ ?Temp:    98 ?F (36.7 ?C)  ?TempSrc:    Oral  ?SpO2: 96% 97%  100%  ?Weight:   85.7 kg   ? ?Eyes: PERRL, lids and conjunctivae normal ?ENMT: Mucous membranes are dry.  posterior pharynx clear of any exudate or lesions.Normal dentition.  ?Neck: normal, supple, no masses, no thyromegaly ?Respiratory: clear to auscultation bilaterally, no wheezing, no crackles. Normal respiratory effort. No accessory muscle use.  ?Cardiovascular: Regular rate and rhythm, no murmurs / rubs / gallops. No extremity edema. 2+ pedal pulses. No carotid bruits.  ?Abdomen: no tenderness, no masses palpated. No hepatosplenomegaly. Bowel sounds positive.  ?Musculoskeletal: no clubbing / cyanosis. No joint deformity upper and lower extremities. Good ROM, no contractures.  Poor muscle tone.  ?Skin: no rashes, lesions, ulcers. No induration; tenting of skin on arms and hands ?Neurologic: CN 2-12 grossly intact. Sensation intact, DTR normal. Strength 4-/5 in all 4.  ?Psychiatric: Normal judgment and insight. Alert and oriented x 3.  Affect flat mood depressed ? ? ?Labs on Admission: I have personally reviewed following labs and imaging studies ? ?CBC: ?Recent Labs  ?Lab 01/13/22 ?1236  ?WBC 9.8  ?HGB 13.1  ?HCT 38.1  ?MCV 84.5  ?PLT 572*  ? ?Basic Metabolic  Panel: ?Recent Labs  ?Lab 01/13/22 ?1236  ?NA 133*  ?K 2.7*  ?CL 93*  ?CO2 31  ?GLUCOSE 113*  ?BUN 24*  ?CREATININE 2.43*  ?CALCIUM 13.4*  ?MG 2.2  ? ?GFR: ?Estimated Creatinine Clearance: 29.1 mL/min (A) (by C-G formula based on SCr of 2.43 mg/dL (H)). ?Urine analysis: ?   ?Component Value Date/Time  ? COLORURINE YELLOW 08/21/2016 1400  ? APPEARANCEUR Clear 10/25/2021 1628  ? LABSPEC <1.005 (L) 08/21/2016 1400  ? PHURINE 5.5 08/21/2016 1400  ? GLUCOSEU Negative 10/25/2021 1628  ? HGBUR TRACE (A) 08/21/2016 1400  ? BILIRUBINUR Negative 10/25/2021 1628  ? [place] NEGATIVE 08/21/2016 1400  ? PROTEINUR Negative 10/25/2021 1628  ? PROTEINUR NEGATIVE 08/21/2016 1400  ? NITRITE Negative 10/25/2021 1628  ? NITRITE NEGATIVE 08/21/2016 1400  ? LEUKOCYTESUR Trace (  A) 10/25/2021 1628  ? ? ?Radiological Exams on Admission: ?CT Head Wo Contrast ? ?Result Date: 01/13/2022 ?CLINICAL DATA:  Altered mental status EXAM: CT HEAD WITHOUT CONTRAST TECHNIQUE: Contiguous axial images were obtained from the base of the skull through the vertex without intravenous contrast. RADIATION DOSE REDUCTION: This exam was performed according to the departmental dose-optimization program which includes automated exposure control, adjustment of the mA and/or kV according to patient size and/or use of iterative reconstruction technique. COMPARISON:  12/02/2021 FINDINGS: Brain: No acute intracranial findings are seen. Cortical sulci are prominent. Ventricles are not dilated. There is no shift of midline structures. There are no epidural or subdural fluid collections. Vascular: Unremarkable. Skull: No fracture is seen in the calvarium. Sinuses/Orbits: Mucous retention cyst is seen in the left side of sphenoid sinus. Other: None IMPRESSION: No acute intracranial findings are seen in noncontrast CT brain. Mild chronic sphenoid sinusitis. Electronically Signed   By: Elmer Picker M.D.   On: 01/13/2022 14:55   ? ?EKG: Independently reviewed. sinus  rhythm with short PR interval and incomplete left bundle branch block with possibly incomplete left bundle branch block as well.  Wavy baseline and difficult to interpret..  No prior available for comparison ? ?Assessment/

## 2022-01-13 NOTE — ED Notes (Signed)
Date and time results received: 01/13/22 1320 ? ? ?Test:Potassium ?        Calcium ?Critical Value: 2.7 ?                       13.4 ? ?Name of Provider Notified: PA Lalla Brothers ? ?Orders Received? Or Actions Taken?: notified ?

## 2022-01-14 ENCOUNTER — Inpatient Hospital Stay (HOSPITAL_COMMUNITY): Payer: 59

## 2022-01-14 DIAGNOSIS — G9341 Metabolic encephalopathy: Secondary | ICD-10-CM

## 2022-01-14 DIAGNOSIS — N179 Acute kidney failure, unspecified: Secondary | ICD-10-CM | POA: Diagnosis not present

## 2022-01-14 DIAGNOSIS — I10 Essential (primary) hypertension: Secondary | ICD-10-CM

## 2022-01-14 LAB — BASIC METABOLIC PANEL
Anion gap: 4 — ABNORMAL LOW (ref 5–15)
BUN: 22 mg/dL — ABNORMAL HIGH (ref 6–20)
CO2: 28 mmol/L (ref 22–32)
Calcium: 11.9 mg/dL — ABNORMAL HIGH (ref 8.9–10.3)
Chloride: 104 mmol/L (ref 98–111)
Creatinine, Ser: 2.03 mg/dL — ABNORMAL HIGH (ref 0.44–1.00)
GFR, Estimated: 28 mL/min — ABNORMAL LOW (ref >60)
Glucose, Bld: 96 mg/dL (ref 70–99)
Potassium: 3.1 mmol/L — ABNORMAL LOW (ref 3.5–5.1)
Sodium: 136 mmol/L (ref 135–145)

## 2022-01-14 LAB — CBC
HCT: 31.6 % — ABNORMAL LOW (ref 36.0–46.0)
Hemoglobin: 10.1 g/dL — ABNORMAL LOW (ref 12.0–15.0)
MCH: 27.5 pg (ref 26.0–34.0)
MCHC: 32 g/dL (ref 30.0–36.0)
MCV: 86.1 fL (ref 80.0–100.0)
Platelets: 431 10*3/uL — ABNORMAL HIGH (ref 150–400)
RBC: 3.67 MIL/uL — ABNORMAL LOW (ref 3.87–5.11)
RDW: 15.3 % (ref 11.5–15.5)
WBC: 6.7 10*3/uL (ref 4.0–10.5)
nRBC: 0 % (ref 0.0–0.2)

## 2022-01-14 LAB — PHOSPHORUS: Phosphorus: 1.8 mg/dL — ABNORMAL LOW (ref 2.5–4.6)

## 2022-01-14 LAB — MAGNESIUM: Magnesium: 1.9 mg/dL (ref 1.7–2.4)

## 2022-01-14 MED ORDER — SIMETHICONE 80 MG PO CHEW
160.0000 mg | CHEWABLE_TABLET | Freq: Four times a day (QID) | ORAL | Status: DC | PRN
  Administered 2022-01-14 – 2022-01-18 (×3): 160 mg via ORAL
  Filled 2022-01-14 (×3): qty 2

## 2022-01-14 MED ORDER — POTASSIUM PHOSPHATES 15 MMOLE/5ML IV SOLN
30.0000 mmol | Freq: Once | INTRAVENOUS | Status: AC
  Administered 2022-01-14: 30 mmol via INTRAVENOUS
  Filled 2022-01-14: qty 10

## 2022-01-14 MED ORDER — LACTATED RINGERS IV SOLN: INTRAVENOUS | Status: DC

## 2022-01-14 NOTE — Progress Notes (Signed)
?PROGRESS NOTE ? ? ? ?Leah Padilla  NKN:[id number] DOB: 1961-07-03 DOA: 01/13/2022 ?PCP: Redmond School, MD  ? ? ?Brief Narrative:  ?61 year old female admitted to the hospital with progressive weakness and lethargy for the past 2 to 3 weeks.  She had decreased p.o. intake during this time.  She had recent diagnosis of Bell's palsy in 11/2021.  She was noted to be hypercalcemic and acute renal failure on admission.  Started on IV fluids and admitted for further work-up. ? ? ?Assessment & Plan: ?  ?Principal Problem: ?  Hypercalcemia ?Active Problems: ?  Acute renal failure superimposed on stage 3b chronic kidney disease ([place]) ?  Hypokalemia ?  Acute metabolic encephalopathy ?  Essential hypertension ? ? ?Hypercalcemia ?-Etiology is unclear ?-Check PTH, PTH RP, vitamin D, chest x-ray ?-Continue IV fluids ? ?Acute kidney injury ?-Likely related to dehydration and hypercalcemia ?-Continue IV fluids and monitor ?-Hold ARB ? ?Acute metabolic encephalopathy ?-Related to hypercalcemia and dehydration ?-Mental status appears to be slowly improving ?-Continue to monitor ? ?Hypokalemia/hypophosphatemia ?-Replace ? ?Hypertension ?-Holding home ARB in light of acute kidney injury ?-Use hydralazine as needed ? ?Hyperlipidemia ?-Continue statin ? ?Recent diagnosis of Bell's palsy ?-Diagnosed 11/2021 ?-Affecting left side of face ?-Completed a course of prednisone ? ? ?DVT prophylaxis: enoxaparin (LOVENOX) injection 30 mg Start: 01/14/22 1745 ? ?Code Status: Full code ?Family Communication: Updated son over the phone 4/15 ?Disposition Plan: Status is: Inpatient ?Remains inpatient appropriate because: Continued management with IV fluids for hypercalcemia and further work-up regarding etiology ? ? ? ? ?Consultants:  ? ? ?Procedures:  ? ? ?Antimicrobials:  ?  ? ? ?Subjective: ?She is more awake today.  Denies any shortness of breath or cough. ? ?Objective: ?Vitals:  ? 01/13/22 1841 01/13/22 1945 01/14/22 0438 01/14/22 1410  ?BP: (!)  178/91 (!) 150/75 (!) 159/85 (!) 166/97  ?Pulse:  81 79 74  ?Resp:  18  18  ?Temp:  97.9 ?F (36.6 ?C)  98 ?F (36.7 ?C)  ?TempSrc:  Oral  Oral  ?SpO2:  97% 100% 99%  ?Weight:      ? ? ?Intake/Output Summary (Last 24 hours) at 01/14/2022 1858 ?Last data filed at 01/14/2022 1726 ?Gross per 24 hour  ?Intake 1217.21 ml  ?Output --  ?Net 1217.21 ml  ? ?Filed Weights  ? 01/13/22 1747  ?Weight: 85.7 kg  ? ? ?Examination: ? ?General exam: Appears calm and comfortable  ?Respiratory system: Clear to auscultation. Respiratory effort normal. ?Cardiovascular system: S1 & S2 heard, RRR. No JVD, murmurs, rubs, gallops or clicks. No pedal edema. ?Gastrointestinal system: Abdomen is nondistended, soft and nontender. No organomegaly or masses felt. Normal bowel sounds heard. ?Central nervous system: Alert and oriented.  Left face weakness including nasolabial fold ?Extremities: Symmetric 5 x 5 power. ?Skin: No rashes, lesions or ulcers ?Psychiatry: Judgement and insight appear normal. Mood & affect appropriate.  ? ? ? ?Data Reviewed: I have personally reviewed following labs and imaging studies ? ?CBC: ?Recent Labs  ?Lab 01/13/22 ?1236 01/14/22 ?3790  ?WBC 9.8 6.7  ?HGB 13.1 10.1*  ?HCT 38.1 31.6*  ?MCV 84.5 86.1  ?PLT 572* 431*  ? ?Basic Metabolic Panel: ?Recent Labs  ?Lab 01/13/22 ?1236 01/13/22 ?1732 01/14/22 ?2409  ?NA 133*  --  136  ?K 2.7*  --  3.1*  ?CL 93*  --  104  ?CO2 31  --  28  ?GLUCOSE 113*  --  96  ?BUN 24*  --  22*  ?CREATININE 2.43*  --  2.03*  ?CALCIUM 13.4*  --  11.9*  ?MG 2.2  --  1.9  ?PHOS  --  1.9* 1.8*  ? ?GFR: ?Estimated Creatinine Clearance: 34.9 mL/min (A) (by C-G formula based on SCr of 2.03 mg/dL (H)). ?Liver Function Tests: ?No results for input(s): AST, ALT, ALKPHOS, BILITOT, PROT, ALBUMIN in the last 168 hours. ?No results for input(s): LIPASE, AMYLASE in the last 168 hours. ?No results for input(s): AMMONIA in the last 168 hours. ?Coagulation Profile: ?No results for input(s): INR, PROTIME in the last  168 hours. ?Cardiac Enzymes: ?No results for input(s): CKTOTAL, CKMB, CKMBINDEX, TROPONINI in the last 168 hours. ?BNP (last 3 results) ?No results for input(s): PROBNP in the last 8760 hours. ?HbA1C: ?No results for input(s): HGBA1C in the last 72 hours. ?CBG: ?No results for input(s): GLUCAP in the last 168 hours. ?Lipid Profile: ?No results for input(s): CHOL, HDL, LDLCALC, TRIG, CHOLHDL, LDLDIRECT in the last 72 hours. ?Thyroid Function Tests: ?Recent Labs  ?  01/13/22 ?1957  ?TSH 0.385  ? ?Anemia Panel: ?No results for input(s): VITAMINB12, FOLATE, FERRITIN, TIBC, IRON, RETICCTPCT in the last 72 hours. ?Sepsis Labs: ?No results for input(s): PROCALCITON, LATICACIDVEN in the last 168 hours. ? ?No results found for this or any previous visit (from the past 240 hour(s)).  ? ? ? ? ? ?Radiology Studies: ?CT Head Wo Contrast ? ?Result Date: 01/13/2022 ?CLINICAL DATA:  Altered mental status EXAM: CT HEAD WITHOUT CONTRAST TECHNIQUE: Contiguous axial images were obtained from the base of the skull through the vertex without intravenous contrast. RADIATION DOSE REDUCTION: This exam was performed according to the departmental dose-optimization program which includes automated exposure control, adjustment of the mA and/or kV according to patient size and/or use of iterative reconstruction technique. COMPARISON:  12/02/2021 FINDINGS: Brain: No acute intracranial findings are seen. Cortical sulci are prominent. Ventricles are not dilated. There is no shift of midline structures. There are no epidural or subdural fluid collections. Vascular: Unremarkable. Skull: No fracture is seen in the calvarium. Sinuses/Orbits: Mucous retention cyst is seen in the left side of sphenoid sinus. Other: None IMPRESSION: No acute intracranial findings are seen in noncontrast CT brain. Mild chronic sphenoid sinusitis. Electronically Signed   By: Elmer Picker M.D.   On: 01/13/2022 14:55  ? ?DG CHEST PORT 1 VIEW ? ?Result Date:  01/14/2022 ?CLINICAL DATA:  Chest pain, shortness of breath, hypertension EXAM: PORTABLE CHEST 1 VIEW COMPARISON:  None. FINDINGS: Single frontal view of the chest demonstrates an unremarkable cardiac silhouette. No acute airspace disease, effusion, or pneumothorax. No acute bony abnormalities. IMPRESSION: 1. No acute intrathoracic process. Electronically Signed   By: Randa Ngo M.D.   On: 01/14/2022 15:47   ? ? ? ? ? ?Scheduled Meds: ? enoxaparin (LOVENOX) injection  30 mg Subcutaneous Q24H  ? feeding supplement  237 mL Oral BID BM  ? imipramine  25 mg Oral QHS  ? sodium chloride flush  3 mL Intravenous Q12H  ? ?Continuous Infusions: ? sodium chloride    ? lactated ringers 125 mL/hr at 01/14/22 1725  ? ? ? LOS: 1 day  ? ? ?Time spent: 55mns ? ? ? ?JKathie Dike MD ?[facility] ? ? ?If 7PM-7AM, please contact night-coverage ?[email or web address] ? ?01/14/2022, 6:58 PM  ? ?

## 2022-01-14 NOTE — Progress Notes (Signed)
?  Transition of Care (TOC) Screening Note ? ? ?Patient Details  ?Name: Leah Padilla ?Date of Birth: [birth year] ? ? ?Transition of Care (TOC) CM/SW Contact:    ?Iona Beard, LCSWA ?Phone Number: ?01/14/2022, 10:39 AM ? ? ? ?Transition of Care Department [facility]) has reviewed patient and no TOC needs have been identified at this time. We will continue to monitor patient advancement through interdisciplinary progression rounds. If new patient transition needs arise, please place a TOC consult. ?  ?

## 2022-01-15 ENCOUNTER — Inpatient Hospital Stay (HOSPITAL_COMMUNITY): Payer: 59

## 2022-01-15 DIAGNOSIS — G9341 Metabolic encephalopathy: Secondary | ICD-10-CM | POA: Diagnosis not present

## 2022-01-15 DIAGNOSIS — I10 Essential (primary) hypertension: Secondary | ICD-10-CM | POA: Diagnosis not present

## 2022-01-15 DIAGNOSIS — N179 Acute kidney failure, unspecified: Secondary | ICD-10-CM | POA: Diagnosis not present

## 2022-01-15 LAB — COMPREHENSIVE METABOLIC PANEL
ALT: 20 U/L (ref 0–44)
AST: 27 U/L (ref 15–41)
Albumin: 2.8 g/dL — ABNORMAL LOW (ref 3.5–5.0)
Alkaline Phosphatase: 91 U/L (ref 38–126)
Anion gap: 6 (ref 5–15)
BUN: 19 mg/dL (ref 6–20)
CO2: 25 mmol/L (ref 22–32)
Calcium: 12.2 mg/dL — ABNORMAL HIGH (ref 8.9–10.3)
Chloride: 105 mmol/L (ref 98–111)
Creatinine, Ser: 1.96 mg/dL — ABNORMAL HIGH (ref 0.44–1.00)
GFR, Estimated: 29 mL/min — ABNORMAL LOW (ref >60)
Glucose, Bld: 94 mg/dL (ref 70–99)
Potassium: 2.8 mmol/L — ABNORMAL LOW (ref 3.5–5.1)
Sodium: 136 mmol/L (ref 135–145)
Total Bilirubin: 0.3 mg/dL (ref 0.3–1.2)
Total Protein: 6.4 g/dL — ABNORMAL LOW (ref 6.5–8.1)

## 2022-01-15 LAB — CBC
HCT: 30.2 % — ABNORMAL LOW (ref 36.0–46.0)
Hemoglobin: 10.2 g/dL — ABNORMAL LOW (ref 12.0–15.0)
MCH: 28.5 pg (ref 26.0–34.0)
MCHC: 33.8 g/dL (ref 30.0–36.0)
MCV: 84.4 fL (ref 80.0–100.0)
Platelets: 447 10*3/uL — ABNORMAL HIGH (ref 150–400)
RBC: 3.58 MIL/uL — ABNORMAL LOW (ref 3.87–5.11)
RDW: 15.3 % (ref 11.5–15.5)
WBC: 7.2 10*3/uL (ref 4.0–10.5)
nRBC: 0 % (ref 0.0–0.2)

## 2022-01-15 LAB — MAGNESIUM: Magnesium: 1.8 mg/dL (ref 1.7–2.4)

## 2022-01-15 LAB — VITAMIN D 25 HYDROXY (VIT D DEFICIENCY, FRACTURES): Vit D, 25-Hydroxy: 59.07 ng/mL (ref 30–100)

## 2022-01-15 LAB — PHOSPHORUS: Phosphorus: 2.9 mg/dL (ref 2.5–4.6)

## 2022-01-15 MED ORDER — HYDRALAZINE HCL 20 MG/ML IJ SOLN: 10.0000 mg | Freq: Four times a day (QID) | INTRAMUSCULAR | Status: DC | PRN

## 2022-01-15 MED ORDER — POLYETHYLENE GLYCOL 3350 17 G PO PACK
17.0000 g | PACK | Freq: Every day | ORAL | Status: DC
  Administered 2022-01-18 – 2022-01-19 (×2): 17 g via ORAL
  Filled 2022-01-15 (×5): qty 1

## 2022-01-15 MED ORDER — ZOLEDRONIC ACID 4 MG/5ML IV CONC
4.0000 mg | Freq: Once | INTRAVENOUS | Status: AC
  Administered 2022-01-15: 4 mg via INTRAVENOUS
  Filled 2022-01-15: qty 5

## 2022-01-15 MED ORDER — BISACODYL 5 MG PO TBEC
10.0000 mg | DELAYED_RELEASE_TABLET | Freq: Once | ORAL | Status: DC
  Filled 2022-01-15: qty 2

## 2022-01-15 MED ORDER — ZOLEDRONIC ACID 4 MG/5ML IV CONC
INTRAVENOUS | Status: AC
  Filled 2022-01-15: qty 5

## 2022-01-15 MED ORDER — METOPROLOL TARTRATE 25 MG PO TABS
25.0000 mg | ORAL_TABLET | Freq: Two times a day (BID) | ORAL | Status: DC
  Administered 2022-01-15 – 2022-01-19 (×8): 25 mg via ORAL
  Filled 2022-01-15 (×8): qty 1

## 2022-01-15 MED ORDER — SODIUM CHLORIDE 0.9 % IV SOLN: INTRAVENOUS | Status: DC

## 2022-01-15 MED ORDER — POTASSIUM CHLORIDE CRYS ER 20 MEQ PO TBCR
40.0000 meq | EXTENDED_RELEASE_TABLET | ORAL | Status: AC
  Administered 2022-01-15 (×2): 40 meq via ORAL
  Filled 2022-01-15 (×2): qty 2

## 2022-01-15 MED ORDER — CALCITONIN (SALMON) 200 UNIT/ML IJ SOLN
4.0000 [IU]/kg | Freq: Two times a day (BID) | INTRAMUSCULAR | Status: AC
  Administered 2022-01-16 (×3): 342 [IU] via SUBCUTANEOUS
  Filled 2022-01-15 (×3): qty 1.71
  Filled 2022-01-15: qty 2
  Filled 2022-01-15 (×3): qty 1.71

## 2022-01-15 NOTE — Progress Notes (Signed)
?PROGRESS NOTE ? ? ? ?Leah Padilla  DJM:[id number] DOB: 31-Mar-1961 DOA: 01/13/2022 ?PCP: Redmond School, MD  ? ? ?Brief Narrative:  ?61 year old female admitted to the hospital with progressive weakness and lethargy for the past 2 to 3 weeks.  She had decreased p.o. intake during this time.  She had recent diagnosis of Bell's palsy in 11/2021.  She was noted to be hypercalcemic and acute renal failure on admission.  Started on IV fluids and admitted for further work-up. ? ? ?Assessment & Plan: ?  ?Principal Problem: ?  Hypercalcemia ?Active Problems: ?  Acute renal failure superimposed on stage 3b chronic kidney disease ([place]) ?  Hypokalemia ?  Acute metabolic encephalopathy ?  Essential hypertension ? ? ?Hypercalcemia ?-Etiology is unclear ?-Vitamin D 25 hydroxy in normal range at 59 ?-Pending: PTH, PTH-related peptide, vitamin D 1,25 ?-CT chest, abdomen pelvis shows diffuse mild lymphadenopathy suspicious for lymphoproliferative disorder or possible metastatic disease ?-We will also check MRI brain ?-May need oncology input ?-Continue IV fluids with saline ?-Give 1 dose of zoledronic acid ?-We will also start on calcitonin ? ?Acute kidney injury ?-Likely related to dehydration and hypercalcemia ?-Continue IV fluids and monitor ?-Hold ARB ?-Creatinine 2.4 on admission, now 1.9 ? ?Acute metabolic encephalopathy ?-Related to hypercalcemia and dehydration ?-Mental status appears to be slowly improving ?-Continue to monitor ? ?Hypokalemia/hypophosphatemia ?-Replace ? ?Hypertension ?-Holding home ARB in light of acute kidney injury ?-Blood pressures running high ?-Start metoprolol ?-Use hydralazine as needed ? ?Hyperlipidemia ?-Continue statin ? ?Recent diagnosis of Bell's palsy ?-Diagnosed 11/2021 ?-Affecting left side of face ?-Completed a course of prednisone ? ? ?DVT prophylaxis: enoxaparin (LOVENOX) injection 30 mg Start: 01/14/22 1745 ? ?Code Status: Full code ?Family Communication: Updated son over the phone  4/15 ?Disposition Plan: Status is: Inpatient ?Remains inpatient appropriate because: Continued management with IV fluids for hypercalcemia and further work-up regarding etiology ? ? ? ? ?Consultants:  ? ? ?Procedures:  ? ? ?Antimicrobials:  ?  ? ? ?Subjective: ?She reports fair urine output.  No nausea or vomiting.  She does have a headache. ? ?Objective: ?Vitals:  ? 01/14/22 1410 01/14/22 2154 01/15/22 0523 01/15/22 1421  ?BP: (!) 166/97 (!) 196/100 (!) 171/96 (!) 177/91  ?Pulse: 74 83 81 75  ?Resp: '18 18 18 18  '$ ?Temp: 98 ?F (36.7 ?C) (!) 97.3 ?F (36.3 ?C) 98.2 ?F (36.8 ?C) 97.9 ?F (36.6 ?C)  ?TempSrc: Oral   Oral  ?SpO2: 99% 98% 98% 98%  ?Weight:      ? ? ?Intake/Output Summary (Last 24 hours) at 01/15/2022 1747 ?Last data filed at 01/15/2022 1216 ?Gross per 24 hour  ?Intake 1720 ml  ?Output --  ?Net 1720 ml  ? ?Filed Weights  ? 01/13/22 1747  ?Weight: 85.7 kg  ? ? ?Examination: ? ?General exam: Appears calm and comfortable  ?Respiratory system: Clear to auscultation. Respiratory effort normal. ?Cardiovascular system: S1 & S2 heard, RRR. No JVD, murmurs, rubs, gallops or clicks. No pedal edema. ?Gastrointestinal system: Abdomen is nondistended, soft and nontender. No organomegaly or masses felt. Normal bowel sounds heard. ?Central nervous system: Alert and oriented.  Left face weakness including nasolabial fold ?Extremities: Symmetric 5 x 5 power. ?Skin: No rashes, lesions or ulcers ?Psychiatry: Judgement and insight appear normal. Mood & affect appropriate.  ? ? ? ?Data Reviewed: I have personally reviewed following labs and imaging studies ? ?CBC: ?Recent Labs  ?Lab 01/13/22 ?1236 01/14/22 ?2229 01/15/22 ?0336  ?WBC 9.8 6.7 7.2  ?HGB 13.1 10.1* 10.2*  ?  HCT 38.1 31.6* 30.2*  ?MCV 84.5 86.1 84.4  ?PLT 572* 431* 447*  ? ?Basic Metabolic Panel: ?Recent Labs  ?Lab 01/13/22 ?1236 01/13/22 ?1732 01/14/22 ?5681 01/15/22 ?0336  ?NA 133*  --  136 136  ?K 2.7*  --  3.1* 2.8*  ?CL 93*  --  104 105  ?CO2 31  --  28 25   ?GLUCOSE 113*  --  96 94  ?BUN 24*  --  22* 19  ?CREATININE 2.43*  --  2.03* 1.96*  ?CALCIUM 13.4*  --  11.9* 12.2*  ?MG 2.2  --  1.9 1.8  ?PHOS  --  1.9* 1.8* 2.9  ? ?GFR: ?Estimated Creatinine Clearance: 36.1 mL/min (A) (by C-G formula based on SCr of 1.96 mg/dL (H)). ?Liver Function Tests: ?Recent Labs  ?Lab 01/15/22 ?0336  ?AST 27  ?ALT 20  ?ALKPHOS 91  ?BILITOT 0.3  ?PROT 6.4*  ?ALBUMIN 2.8*  ? ?No results for input(s): LIPASE, AMYLASE in the last 168 hours. ?No results for input(s): AMMONIA in the last 168 hours. ?Coagulation Profile: ?No results for input(s): INR, PROTIME in the last 168 hours. ?Cardiac Enzymes: ?No results for input(s): CKTOTAL, CKMB, CKMBINDEX, TROPONINI in the last 168 hours. ?BNP (last 3 results) ?No results for input(s): PROBNP in the last 8760 hours. ?HbA1C: ?No results for input(s): HGBA1C in the last 72 hours. ?CBG: ?No results for input(s): GLUCAP in the last 168 hours. ?Lipid Profile: ?No results for input(s): CHOL, HDL, LDLCALC, TRIG, CHOLHDL, LDLDIRECT in the last 72 hours. ?Thyroid Function Tests: ?Recent Labs  ?  01/13/22 ?1957  ?TSH 0.385  ? ?Anemia Panel: ?No results for input(s): VITAMINB12, FOLATE, FERRITIN, TIBC, IRON, RETICCTPCT in the last 72 hours. ?Sepsis Labs: ?No results for input(s): PROCALCITON, LATICACIDVEN in the last 168 hours. ? ?No results found for this or any previous visit (from the past 240 hour(s)).  ? ? ? ? ? ?Radiology Studies: ?CT CHEST WO CONTRAST ? ?Result Date: 01/15/2022 ?CLINICAL DATA:  Chest wall pain and suspected infection. Worsening weakness and lethargy for several weeks. Acute renal failure. EXAM: CT CHEST, ABDOMEN AND PELVIS WITHOUT CONTRAST TECHNIQUE: Multidetector CT imaging of the chest, abdomen and pelvis was performed following the standard protocol without IV contrast. RADIATION DOSE REDUCTION: This exam was performed according to the departmental dose-optimization program which includes automated exposure control, adjustment of the mA  and/or kV according to patient size and/or use of iterative reconstruction technique. COMPARISON:  AP only CT on 09/12/2015 FINDINGS: CT CHEST FINDINGS Cardiovascular: No acute findings. Mediastinum/Lymph Nodes: Mild bilateral hilar and subpectoral lymphadenopathy is seen bilaterally. Largest index lymph node in the right axilla measures 1.5 cm on image 38/2. Multiple sub-cm mediastinal lymph nodes are noted, none of which are pathologically enlarged. No definite hilar lymphadenopathy identified although mild hilar lymphadenopathy is difficult to exclude on this unenhanced exam. Lungs/Pleura: Mild bandlike opacities in both lower lobes may be due to mild scarring or atelectasis. No evidence of infiltrate, mass, or pleural effusion. Musculoskeletal:  No suspicious bone lesions identified. CT ABDOMEN AND PELVIS FINDINGS Hepatobiliary: No masses visualized on this unenhanced exam. Gallbladder is unremarkable. No evidence of biliary ductal dilatation. Pancreas: No mass or inflammatory changes identified on this unenhanced exam. Spleen:  Within normal limits in size. Adrenals/Urinary Tract: 2 mm nonobstructing calculus noted in the upper pole of the left kidney. No evidence of ureteral calculi or hydronephrosis. Unremarkable appearance of bladder. Stomach/Bowel: No evidence of obstruction, inflammatory process, or abnormal fluid collections. Vascular/Lymphatic: Mild abdominal  lymphadenopathy is seen within the retroperitoneum and small bowel mesentery. Mild pelvic lymphadenopathy is seen throughout the iliac chains and bilateral inguinal regions. Index lymph node in the left external iliac chain measures 1.4 cm on image 70/2. No abdominal aortic aneurysm. Reproductive:  No masses or other significant abnormality. Other:  None. Musculoskeletal:  No suspicious bone lesions identified. IMPRESSION: Mild lymphadenopathy throughout the chest, abdomen, and pelvis. This is suspicious for lymphoproliferative disorder, or less  likely metastatic disease. Tiny nonobstructing left renal calculus. No evidence of ureteral calculi or hydronephrosis. Electronically Signed   By: Marlaine Hind M.D.   On: 01/15/2022 13:29  ? ?DG CHEST PORT 1 VIEW ? ?

## 2022-01-15 NOTE — Progress Notes (Signed)
Initial Nutrition Assessment ? ?DOCUMENTATION CODES:  ? ?Not applicable ? ?INTERVENTION:  ?- Liberalize diet from a heart healthy to a regular diet to provide widest variety of menu options to enhance nutritional adequacy ? ?- Ensure Enlive po BID, each supplement provides 350 kcal and 20 grams of protein  ? ?NUTRITION DIAGNOSIS:  ? ?Increased nutrient needs related to acute illness as evidenced by estimated needs. ? ?GOAL:  ? ?Patient will meet greater than or equal to 90% of their needs ? ?MONITOR:  ? ?PO intake, Supplement acceptance, Diet advancement, Labs, Weight trends ? ?REASON FOR ASSESSMENT:  ? ?Malnutrition Screening Tool ?  ? ?ASSESSMENT:  ? ?Pt admitted with progressive weakness, lethargy and decreased PO intake within the past 2-3 weeks. Noted to be hypercalcemic and acute renal failure on admit. PMH significant for recent diagnosis of Bell's palsy (11/2021). ? ?Spoke with pt via phone call to room. She states that her appetite has improved slightly from where it has been since the beginning of March. Since her diagnosis of Bell's Palsy, she reports that she has not been able to eat a full meal. She was noticing improvements with addition of prednisone but has since begun to feel sick once stopping the medication. During admission she reports that her appetite is slightly improved again and is eating a little more than half of her meals. We discussed diet liberalization and continuing Ensure as she enjoys these drinks.  ? ?Meal completion: ?04/15: 65%- breakfast, 60%-lunch ? ?Pt states that it has been some time since she has weighed herself but states that her usual weight used to be ~150 lbs. She does not suspect much weight loss and that her clothes fit slightly more loose.  ? ?Reviewed weight history. Weights PTA appear to be stable around 77.1 kg. Uncertain of accuracy of current admit weight, as this is significantly elevated from the last months recorded weight and pt is noted to have c/o  decreased in appetite although she did note use of prednisone which may contribute to weight gain. Will use 03/03 weight of 77.1 kg to estimate needs and continue to reassess as appropriate.  ? ?Medications: LR @ 129m/hr ? ?Labs: potassium 2.8, Cr 1.96 ? ?NUTRITION - FOCUSED PHYSICAL EXAM: ?RD working remotely. Deferred to follow up. ? ?Diet Order:   ?Diet Order   ? ?       ?  Diet regular Room service appropriate? Yes; Fluid consistency: Thin  Diet effective now       ?  ? ?  ?  ? ?  ? ? ?EDUCATION NEEDS:  ? ?No education needs have been identified at this time ? ?Skin:  Skin Assessment: Reviewed RN Assessment ? ?Last BM:  unknown ? ?Height:  ? ?Ht Readings from Last 1 Encounters:  ?12/02/21 '5\' 9"'$  (1.753 m)  ? ? ?Weight:  ? ?Wt Readings from Last 1 Encounters:  ?01/13/22 85.7 kg  ? ? ?BMI:  Body mass index is 27.91 kg/m?. ? ?Estimated Nutritional Needs:  ? ?Kcal:  1800-2000 ? ?Protein:  90-105g ? ?Fluid:  >/=1.8L ? ?AClayborne Dana RDN, LDN ?Clinical Nutrition ?

## 2022-01-16 ENCOUNTER — Inpatient Hospital Stay (HOSPITAL_COMMUNITY): Payer: 59

## 2022-01-16 DIAGNOSIS — R591 Generalized enlarged lymph nodes: Secondary | ICD-10-CM

## 2022-01-16 DIAGNOSIS — G9341 Metabolic encephalopathy: Secondary | ICD-10-CM | POA: Diagnosis not present

## 2022-01-16 DIAGNOSIS — N1832 Chronic kidney disease, stage 3b: Secondary | ICD-10-CM

## 2022-01-16 DIAGNOSIS — N179 Acute kidney failure, unspecified: Secondary | ICD-10-CM | POA: Diagnosis not present

## 2022-01-16 DIAGNOSIS — I10 Essential (primary) hypertension: Secondary | ICD-10-CM | POA: Diagnosis not present

## 2022-01-16 LAB — RENAL FUNCTION PANEL
Albumin: 2.8 g/dL — ABNORMAL LOW (ref 3.5–5.0)
Anion gap: 4 — ABNORMAL LOW (ref 5–15)
BUN: 15 mg/dL (ref 6–20)
CO2: 24 mmol/L (ref 22–32)
Calcium: 11.1 mg/dL — ABNORMAL HIGH (ref 8.9–10.3)
Chloride: 111 mmol/L (ref 98–111)
Creatinine, Ser: 1.81 mg/dL — ABNORMAL HIGH (ref 0.44–1.00)
GFR, Estimated: 32 mL/min — ABNORMAL LOW (ref >60)
Glucose, Bld: 109 mg/dL — ABNORMAL HIGH (ref 70–99)
Phosphorus: 2.1 mg/dL — ABNORMAL LOW (ref 2.5–4.6)
Potassium: 3.6 mmol/L (ref 3.5–5.1)
Sodium: 139 mmol/L (ref 135–145)

## 2022-01-16 LAB — PTH, INTACT AND CALCIUM
Calcium, Total (PTH): 12.5 mg/dL — ABNORMAL HIGH (ref 8.7–10.2)
PTH: 12 pg/mL — ABNORMAL LOW (ref 15–65)

## 2022-01-16 LAB — CALCITRIOL (1,25 DI-OH VIT D): Vit D, 1,25-Dihydroxy: 65.7 pg/mL (ref 24.8–81.5)

## 2022-01-16 MED ORDER — LORAZEPAM 2 MG/ML IJ SOLN
1.0000 mg | Freq: Four times a day (QID) | INTRAMUSCULAR | Status: DC | PRN
  Administered 2022-01-16 – 2022-01-17 (×2): 1 mg via INTRAVENOUS
  Filled 2022-01-16 (×2): qty 1

## 2022-01-16 MED ORDER — PROCHLORPERAZINE EDISYLATE 10 MG/2ML IJ SOLN
10.0000 mg | Freq: Four times a day (QID) | INTRAMUSCULAR | Status: DC | PRN
  Administered 2022-01-16: 10 mg via INTRAVENOUS
  Filled 2022-01-16: qty 2

## 2022-01-16 MED ORDER — ENOXAPARIN SODIUM 40 MG/0.4ML IJ SOSY
40.0000 mg | PREFILLED_SYRINGE | INTRAMUSCULAR | Status: DC
  Administered 2022-01-16 – 2022-01-18 (×3): 40 mg via SUBCUTANEOUS
  Filled 2022-01-16 (×3): qty 0.4

## 2022-01-16 NOTE — Consult Note (Signed)
[facility] ?Consultation Oncology ? ?Name: Leah Padilla      MRN: [id number]    Location: A304/A304-01  Date: 01/16/2022 Time:4:33 PM ? ? ?REFERRING PHYSICIAN: Dr. Roderic Palau ? ?REASON FOR CONSULT: Hypercalcemia of malignancy ?  ? ?HISTORY OF PRESENT ILLNESS: Ms. Leah Padilla is a 61 year old pleasant white female seen in consultation today at the request of Dr. Roderic Palau for malignant hypercalcemia.  This patient presented to the hospital on 01/13/2022 with calcium of 13.4.  She was previously evaluated in the ER on 12/02/2021 with weakness on the left eye and face and was diagnosed with Bell's palsy and was given steroids.  She reports progressive weakness over the past 6 weeks.  She reported about 20 to 30 pound weight loss in the last 3 months.  She has received zoledronic acid and IV hydration.  CT of the chest, abdomen and pelvis showed diffuse mild lymphadenopathy suspicious for lymphoproliferative disorder.  Calcium is improving along with creatinine.  She reports that she is a non-smoker.  She works at a [employer] in [place] for the past several years.  She lives at her home with her son.  Her mother had multiple myeloma.  No other malignancies in the family. ? ?PAST MEDICAL HISTORY:   ?Past Medical History:  ?Diagnosis Date  ? Anxiety   ? Bell's palsy   ? Depression   ? High cholesterol   ? Hypertension   ? ? ?ALLERGIES: ?Allergies  ?Allergen Reactions  ? Ciprofloxacin Other (See Comments)  ?  Dry mouth, skin tingling, panicky, stomach pain, anxious and nervous  ? Doxycycline   ? Estrace [Estradiol]   ? Penicillins   ? Polymox [Amoxicillin] Hives  ? Progesterone   ? ?   ?MEDICATIONS: I have reviewed the patient's current medications.   ?  ?PAST SURGICAL HISTORY ?Past Surgical History:  ?Procedure Laterality Date  ? COLONOSCOPY N/A 04/17/2013  ? Procedure: COLONOSCOPY;  Surgeon: Rogene Houston, MD;  Location: [facility];  Service: Endoscopy;  Laterality: N/A;  930  ? CYST EXCISION    ? neck   ? ? ?FAMILY HISTORY: ?Family History  ?Problem Relation Age of Onset  ? Stroke Other   ? Diabetes Other   ? Stroke Father   ? Breast cancer Mother   ? Breast cancer Sister   ? ? ?SOCIAL HISTORY: ? reports that she quit smoking about 5 years ago. Her smoking use included cigarettes. She has a 6.50 pack-year smoking history. She has never used smokeless tobacco. She reports that she does not currently use alcohol. She reports that she does not use drugs. ? ?PERFORMANCE STATUS: ?The patient's performance status is 1 - Symptomatic but completely ambulatory ? ?PHYSICAL EXAM: ?Most Recent Vital Signs: Blood pressure (!) 152/73, pulse 74, temperature 98.8 ?F (37.1 ?C), temperature source Oral, resp. rate 17, weight 189 lb (85.7 kg), SpO2 97 %. ?BP (!) 152/73 (BP Location: Right Arm)   Pulse 74   Temp 98.8 ?F (37.1 ?C) (Oral)   Resp 17   Wt 189 lb (85.7 kg)   SpO2 97%   BMI 27.91 kg/m?  ?General appearance: alert, cooperative, and appears stated age ?Lungs: clear to auscultation bilaterally ?Heart: regular rate and rhythm ?Abdomen:  Soft, nontender with no hepatosplenomegaly palpable. ?Extremities:  No edema or cyanosis. ?Lymph nodes:  Palpable adenopathy in the axillary region and inguinal region. ?Neurologic: Grossly normal ? ?LABORATORY DATA:  ?Results for orders placed or performed during the hospital encounter of 01/13/22 (from the past 48  hour(s))  ?CBC     Status: Abnormal  ? Collection Time: 01/15/22  3:36 AM  ?Result Value Ref Range  ? WBC 7.2 4.0 - 10.5 K/uL  ? RBC 3.58 (L) 3.87 - 5.11 MIL/uL  ? Hemoglobin 10.2 (L) 12.0 - 15.0 g/dL  ? HCT 30.2 (L) 36.0 - 46.0 %  ? MCV 84.4 80.0 - 100.0 fL  ? MCH 28.5 26.0 - 34.0 pg  ? MCHC 33.8 30.0 - 36.0 g/dL  ? RDW 15.3 11.5 - 15.5 %  ? Platelets 447 (H) 150 - 400 K/uL  ? nRBC 0.0 0.0 - 0.2 %  ?  Comment: Performed at [facility], [address], [place], [place] [address]  ?Comprehensive metabolic panel     Status: Abnormal  ? Collection Time: 01/15/22  3:36 AM  ?Result  Value Ref Range  ? Sodium 136 135 - 145 mmol/L  ? Potassium 2.8 (L) 3.5 - 5.1 mmol/L  ? Chloride 105 98 - 111 mmol/L  ? CO2 25 22 - 32 mmol/L  ? Glucose, Bld 94 70 - 99 mg/dL  ?  Comment: Glucose reference range applies only to samples taken after fasting for at least 8 hours.  ? BUN 19 6 - 20 mg/dL  ? Creatinine, Ser 1.96 (H) 0.44 - 1.00 mg/dL  ? Calcium 12.2 (H) 8.9 - 10.3 mg/dL  ? Total Protein 6.4 (L) 6.5 - 8.1 g/dL  ? Albumin 2.8 (L) 3.5 - 5.0 g/dL  ? AST 27 15 - 41 U/L  ? ALT 20 0 - 44 U/L  ? Alkaline Phosphatase 91 38 - 126 U/L  ? Total Bilirubin 0.3 0.3 - 1.2 mg/dL  ? GFR, Estimated 29 (L) >60 mL/min  ?  Comment: (NOTE) ?Calculated using the CKD-EPI Creatinine Equation (2021) ?  ? Anion gap 6 5 - 15  ?  Comment: Performed at [facility], [address], [place], [place] [address]  ?Phosphorus     Status: None  ? Collection Time: 01/15/22  3:36 AM  ?Result Value Ref Range  ? Phosphorus 2.9 2.5 - 4.6 mg/dL  ?  Comment: Performed at [facility], [address], [place], [place] [address]  ?Magnesium     Status: None  ? Collection Time: 01/15/22  3:36 AM  ?Result Value Ref Range  ? Magnesium 1.8 1.7 - 2.4 mg/dL  ?  Comment: Performed at [facility], [address], [place], [place] [address]  ?Renal function panel     Status: Abnormal  ? Collection Time: 01/16/22  6:22 AM  ?Result Value Ref Range  ? Sodium 139 135 - 145 mmol/L  ? Potassium 3.6 3.5 - 5.1 mmol/L  ?  Comment: DELTA CHECK NOTED  ? Chloride 111 98 - 111 mmol/L  ? CO2 24 22 - 32 mmol/L  ? Glucose, Bld 109 (H) 70 - 99 mg/dL  ?  Comment: Glucose reference range applies only to samples taken after fasting for at least 8 hours.  ? BUN 15 6 - 20 mg/dL  ? Creatinine, Ser 1.81 (H) 0.44 - 1.00 mg/dL  ? Calcium 11.1 (H) 8.9 - 10.3 mg/dL  ? Phosphorus 2.1 (L) 2.5 - 4.6 mg/dL  ? Albumin 2.8 (L) 3.5 - 5.0 g/dL  ? GFR, Estimated 32 (L) >60 mL/min  ?  Comment: (NOTE) ?Calculated using the CKD-EPI Creatinine Equation (2021) ?  ? Anion gap 4 (L) 5 - 15  ?  Comment:  Performed at [facility], [address], [place], [place] [address]  ?   ? ?  RADIOGRAPHY: ?CT CHEST WO CONTRAST ? ?Result Date: 01/15/2022 ?CLINICAL DATA:  Chest wall pain and suspected infection. Worsening weakness and lethargy for several weeks. Acute renal failure. EXAM: CT CHEST, ABDOMEN AND PELVIS WITHOUT CONTRAST TECHNIQUE: Multidetector CT imaging of the chest, abdomen and pelvis was performed following the standard protocol without IV contrast. RADIATION DOSE REDUCTION: This exam was performed according to the departmental dose-optimization program which includes automated exposure control, adjustment of the mA and/or kV according to patient size and/or use of iterative reconstruction technique. COMPARISON:  AP only CT on 09/12/2015 FINDINGS: CT CHEST FINDINGS Cardiovascular: No acute findings. Mediastinum/Lymph Nodes: Mild bilateral hilar and subpectoral lymphadenopathy is seen bilaterally. Largest index lymph node in the right axilla measures 1.5 cm on image 38/2. Multiple sub-cm mediastinal lymph nodes are noted, none of which are pathologically enlarged. No definite hilar lymphadenopathy identified although mild hilar lymphadenopathy is difficult to exclude on this unenhanced exam. Lungs/Pleura: Mild bandlike opacities in both lower lobes may be due to mild scarring or atelectasis. No evidence of infiltrate, mass, or pleural effusion. Musculoskeletal:  No suspicious bone lesions identified. CT ABDOMEN AND PELVIS FINDINGS Hepatobiliary: No masses visualized on this unenhanced exam. Gallbladder is unremarkable. No evidence of biliary ductal dilatation. Pancreas: No mass or inflammatory changes identified on this unenhanced exam. Spleen:  Within normal limits in size. Adrenals/Urinary Tract: 2 mm nonobstructing calculus noted in the upper pole of the left kidney. No evidence of ureteral calculi or hydronephrosis. Unremarkable appearance of bladder. Stomach/Bowel: No evidence of obstruction, inflammatory  process, or abnormal fluid collections. Vascular/Lymphatic: Mild abdominal lymphadenopathy is seen within the retroperitoneum and small bowel mesentery. Mild pelvic lymphadenopathy is seen throughout the iliac chains an

## 2022-01-16 NOTE — Progress Notes (Signed)
?PROGRESS NOTE ? ? ? ?Leah Padilla  GXQ:[id number] DOB: 04/13/1961 DOA: 01/13/2022 ?PCP: Redmond School, MD  ? ? ?Brief Narrative:  ?61 year old female admitted to the hospital with progressive weakness and lethargy for the past 2 to 3 weeks.  She had decreased p.o. intake during this time.  She had recent diagnosis of Bell's palsy in 11/2021.  She was noted to be hypercalcemic and acute renal failure on admission.  Started on IV fluids and admitted for further work-up. ? ? ?Assessment & Plan: ?  ?Principal Problem: ?  Hypercalcemia ?Active Problems: ?  Acute renal failure superimposed on stage 3b chronic kidney disease ([place]) ?  Hypokalemia ?  Acute metabolic encephalopathy ?  Essential hypertension ?  Lymphadenopathy, generalized ? ? ?Hypercalcemia ?-Etiology is unclear ?-Vitamin D 25 hydroxy in normal range at 59 ?-Vitamin D 1,25 normal range 65.7 ?-PTH low at 12 ?-PTH-related peptide pending ?-CT chest, abdomen pelvis shows diffuse mild lymphadenopathy suspicious for lymphoproliferative disorder or possible metastatic disease ?-Appreciate oncology input ?-Continue IV fluids with saline ?-She received 1 dose of zoledronic acid on 4/16 ?-She is also on calcitonin ? ?Lymphadenopathy ?-Noted on CT chest with concerns for underlying metastatic disease versus lymphoproliferative disorder ?-General surgery consulted and will evaluate for possible axillary node biopsy ? ?Acute kidney injury ?-Likely related to dehydration and hypercalcemia ?-Continue IV fluids and monitor ?-Hold ARB ?-Creatinine 2.4 on admission, now 1.8 ? ?Acute metabolic encephalopathy ?-Related to hypercalcemia and dehydration ?-Mental status appears to be slowly improving ?-MRI brain without any acute findings ?-Continue to monitor ? ?Hypokalemia/hypophosphatemia ?-Replace ? ?Hypertension ?-Holding home ARB in light of acute kidney injury ?-Blood pressures running high ?-Continue metoprolol ?-Use hydralazine as needed ? ?Hyperlipidemia ?-Continue  statin ? ?Recent diagnosis of Bell's palsy ?-Diagnosed 11/2021 ?-Affecting left side of face ?-Completed a course of prednisone ? ? ?DVT prophylaxis: enoxaparin (LOVENOX) injection 40 mg Start: 01/16/22 1800 ? ?Code Status: Full code ?Family Communication: Updated son  4/17 ?Disposition Plan: Status is: Inpatient ?Remains inpatient appropriate because: Continued management with IV fluids for hypercalcemia and further work-up regarding etiology ? ? ? ? ?Consultants:  ?Oncology ?General surgery ? ?Procedures:  ? ? ?Antimicrobials:  ?  ? ? ?Subjective: ?She has some nausea today.  Does not feel as well today. ? ?Objective: ?Vitals:  ? 01/15/22 1421 01/15/22 2039 01/16/22 0514 01/16/22 1348  ?BP: (!) 177/91 (!) 160/96 (!) 162/99 (!) 152/73  ?Pulse: 75 71 80 74  ?Resp: '18 20 20 17  '$ ?Temp: 97.9 ?F (36.6 ?C) 98 ?F (36.7 ?C) 97.6 ?F (36.4 ?C) 98.8 ?F (37.1 ?C)  ?TempSrc: Oral Oral  Oral  ?SpO2: 98% 98% 100% 97%  ?Weight:      ? ? ?Intake/Output Summary (Last 24 hours) at 01/16/2022 1942 ?Last data filed at 01/16/2022 1733 ?Gross per 24 hour  ?Intake 1746.15 ml  ?Output --  ?Net 1746.15 ml  ? ?Filed Weights  ? 01/13/22 1747  ?Weight: 85.7 kg  ? ? ?Examination: ? ?General exam: Appears calm and comfortable  ?Respiratory system: Clear to auscultation. Respiratory effort normal. ?Cardiovascular system: S1 & S2 heard, RRR. No JVD, murmurs, rubs, gallops or clicks. No pedal edema. ?Gastrointestinal system: Abdomen is nondistended, soft and nontender. No organomegaly or masses felt. Normal bowel sounds heard. ?Central nervous system: Alert and oriented.  Left face weakness including nasolabial fold ?Extremities: Symmetric 5 x 5 power. ?Skin: No rashes, lesions or ulcers ?Psychiatry: Judgement and insight appear normal. Mood & affect appropriate.  ? ? ? ?Data Reviewed:  I have personally reviewed following labs and imaging studies ? ?CBC: ?Recent Labs  ?Lab 01/13/22 ?1236 01/14/22 ?1751 01/15/22 ?0336  ?WBC 9.8 6.7 7.2  ?HGB 13.1 10.1*  10.2*  ?HCT 38.1 31.6* 30.2*  ?MCV 84.5 86.1 84.4  ?PLT 572* 431* 447*  ? ?Basic Metabolic Panel: ?Recent Labs  ?Lab 01/13/22 ?1236 01/13/22 ?1732 01/14/22 ?0258 01/14/22 ?1516 01/15/22 ?5277 01/16/22 ?0622  ?NA 133*  --  136  --  136 139  ?K 2.7*  --  3.1*  --  2.8* 3.6  ?CL 93*  --  104  --  105 111  ?CO2 31  --  28  --  25 24  ?GLUCOSE 113*  --  96  --  94 109*  ?BUN 24*  --  22*  --  19 15  ?CREATININE 2.43*  --  2.03*  --  1.96* 1.81*  ?CALCIUM 13.4*  --  11.9* 12.5* 12.2* 11.1*  ?MG 2.2  --  1.9  --  1.8  --   ?PHOS  --  1.9* 1.8*  --  2.9 2.1*  ? ?GFR: ?Estimated Creatinine Clearance: 39.1 mL/min (A) (by C-G formula based on SCr of 1.81 mg/dL (H)). ?Liver Function Tests: ?Recent Labs  ?Lab 01/15/22 ?0336 01/16/22 ?0622  ?AST 27  --   ?ALT 20  --   ?ALKPHOS 91  --   ?BILITOT 0.3  --   ?PROT 6.4*  --   ?ALBUMIN 2.8* 2.8*  ? ?No results for input(s): LIPASE, AMYLASE in the last 168 hours. ?No results for input(s): AMMONIA in the last 168 hours. ?Coagulation Profile: ?No results for input(s): INR, PROTIME in the last 168 hours. ?Cardiac Enzymes: ?No results for input(s): CKTOTAL, CKMB, CKMBINDEX, TROPONINI in the last 168 hours. ?BNP (last 3 results) ?No results for input(s): PROBNP in the last 8760 hours. ?HbA1C: ?No results for input(s): HGBA1C in the last 72 hours. ?CBG: ?No results for input(s): GLUCAP in the last 168 hours. ?Lipid Profile: ?No results for input(s): CHOL, HDL, LDLCALC, TRIG, CHOLHDL, LDLDIRECT in the last 72 hours. ?Thyroid Function Tests: ?Recent Labs  ?  01/13/22 ?1957  ?TSH 0.385  ? ?Anemia Panel: ?No results for input(s): VITAMINB12, FOLATE, FERRITIN, TIBC, IRON, RETICCTPCT in the last 72 hours. ?Sepsis Labs: ?No results for input(s): PROCALCITON, LATICACIDVEN in the last 168 hours. ? ?No results found for this or any previous visit (from the past 240 hour(s)).  ? ? ? ? ? ?Radiology Studies: ?CT CHEST WO CONTRAST ? ?Result Date: 01/15/2022 ?CLINICAL DATA:  Chest wall pain and suspected  infection. Worsening weakness and lethargy for several weeks. Acute renal failure. EXAM: CT CHEST, ABDOMEN AND PELVIS WITHOUT CONTRAST TECHNIQUE: Multidetector CT imaging of the chest, abdomen and pelvis was performed following the standard protocol without IV contrast. RADIATION DOSE REDUCTION: This exam was performed according to the departmental dose-optimization program which includes automated exposure control, adjustment of the mA and/or kV according to patient size and/or use of iterative reconstruction technique. COMPARISON:  AP only CT on 09/12/2015 FINDINGS: CT CHEST FINDINGS Cardiovascular: No acute findings. Mediastinum/Lymph Nodes: Mild bilateral hilar and subpectoral lymphadenopathy is seen bilaterally. Largest index lymph node in the right axilla measures 1.5 cm on image 38/2. Multiple sub-cm mediastinal lymph nodes are noted, none of which are pathologically enlarged. No definite hilar lymphadenopathy identified although mild hilar lymphadenopathy is difficult to exclude on this unenhanced exam. Lungs/Pleura: Mild bandlike opacities in both lower lobes may be due to mild scarring or atelectasis. No  evidence of infiltrate, mass, or pleural effusion. Musculoskeletal:  No suspicious bone lesions identified. CT ABDOMEN AND PELVIS FINDINGS Hepatobiliary: No masses visualized on this unenhanced exam. Gallbladder is unremarkable. No evidence of biliary ductal dilatation. Pancreas: No mass or inflammatory changes identified on this unenhanced exam. Spleen:  Within normal limits in size. Adrenals/Urinary Tract: 2 mm nonobstructing calculus noted in the upper pole of the left kidney. No evidence of ureteral calculi or hydronephrosis. Unremarkable appearance of bladder. Stomach/Bowel: No evidence of obstruction, inflammatory process, or abnormal fluid collections. Vascular/Lymphatic: Mild abdominal lymphadenopathy is seen within the retroperitoneum and small bowel mesentery. Mild pelvic lymphadenopathy is seen  throughout the iliac chains and bilateral inguinal regions. Index lymph node in the left external iliac chain measures 1.4 cm on image 70/2. No abdominal aortic aneurysm. Reproductive:  No masses or other

## 2022-01-16 NOTE — Evaluation (Signed)
Physical Therapy Evaluation ?Patient Details ?Name: Leah Padilla ?MRN: [id number] ?DOB: 01-Apr-1961 ?Today's Date: 01/16/2022 ? ?History of Present Illness ? Kadiatou Oplinger is a 61 y.o. female with medical history significant of hypertension, Bell's palsy, and hypercholesterolemia who presents to the emergency department at the behest of her son who lives with her.  He states the patient has gotten weaker and forgetful and confused patient complains of some nausea.  On arrival.  When I saw her patient was oriented but complains of feeling weak and tired.  Patient feels like she has been feeling weak and tired since she was diagnosed with Bell's palsy 6 weeks ago.  At that time she was seen in the emergency department and she had a slightly elevated calcium and mild hypokalemia.  Bell's palsy was addressed but the patient did not improve.  She was due to see Dr. Gerarda Fraction a week after evaluation in the emergency department but it does not appear she was able to make that visit.  She has continued to feel poorly.  Has occasional difficulty with word finding but no slurred speech.  Has some generalized weakness.  No history of head trauma, fevers, chills, chest pain, shortness of breath, general body aches.  No stomach pains, nausea, vomiting or diarrhea.  This has never happened to her before.  She has no worsening or improving symptoms. ?  ?Clinical Impression ? Patient functioning near baseline for mobility and ambulation. She does not require assist but does demonstrate slight unsteadiness with ambulation and generalized weakness. Patient educated on f/u with outpatient PT if weakness persists as she is not interested in additional PT at this time. Patient discharged to care of nursing for ambulation daily as tolerated for length of stay. ?   ?   ? ?Recommendations for follow up therapy are one component of a multi-disciplinary discharge planning process, led by the attending physician.  Recommendations may be updated  based on patient status, additional functional criteria and insurance authorization. ? ?Follow Up Recommendations No PT follow up ? ?  ?Assistance Recommended at Discharge PRN  ?Patient can return home with the following ?   ? ?  ?Equipment Recommendations None recommended by PT  ?Recommendations for Other Services ?    ?  ?Functional Status Assessment Patient has had a recent decline in their functional status and demonstrates the ability to make significant improvements in function in a reasonable and predictable amount of time.  ? ?  ?Precautions / Restrictions Precautions ?Precautions: Fall ?Restrictions ?Weight Bearing Restrictions: No  ? ?  ? ?Mobility ? Bed Mobility ?Overal bed mobility: Independent ?  ?  ?  ?  ?  ?  ?  ?  ? ?Transfers ?Overall transfer level: Independent ?Equipment used: None ?  ?  ?  ?  ?  ?  ?  ?  ?  ? ?Ambulation/Gait ?Ambulation/Gait assistance: Modified independent (Device/Increase time) ?Gait Distance (Feet): 150 Feet ?Assistive device: None ?Gait Pattern/deviations: Decreased stride length ?Gait velocity: decreased ?  ?  ?General Gait Details: mildly unsteady without AD ? ?Stairs ?  ?  ?  ?  ?  ? ?Wheelchair Mobility ?  ? ?Modified Rankin (Stroke Patients Only) ?  ? ?  ? ?Balance Overall balance assessment: Needs assistance ?  ?Sitting balance-Leahy Scale: Normal ?Sitting balance - Comments: seated EOB ?  ?Standing balance support: No upper extremity supported ?Standing balance-Leahy Scale: Good ?Standing balance comment: without AD ?  ?  ?  ?  ?  ?  ?  ?  ?  ?  ?  ?   ? ? ? ?  Pertinent Vitals/Pain Pain Assessment ?Pain Assessment: No/denies pain  ? ? ?Home Living Family/patient expects to be discharged to:: Private residence ?Living Arrangements: Children ?Available Help at Discharge: Family ?Type of Home: House ?Home Access: Stairs to enter ?  ?Entrance Stairs-Number of Steps: 3-4 ?Alternate Level Stairs-Number of Steps: 13 ?Home Layout: Two level ?Home Equipment: None ?   ?  ?Prior  Function Prior Level of Function : Independent/Modified Independent ?  ?  ?  ?  ?  ?  ?  ?  ?  ? ? ?Hand Dominance  ?   ? ?  ?Extremity/Trunk Assessment  ? Upper Extremity Assessment ?Upper Extremity Assessment: Overall WFL for tasks assessed ?  ? ?Lower Extremity Assessment ?Lower Extremity Assessment: Generalized weakness ?  ? ?Cervical / Trunk Assessment ?Cervical / Trunk Assessment: Normal  ?Communication  ? Communication: No difficulties  ?Cognition Arousal/Alertness: Awake/alert ?Behavior During Therapy: [facility] for tasks assessed/performed ?Overall Cognitive Status: Within Functional Limits for tasks assessed ?  ?  ?  ?  ?  ?  ?  ?  ?  ?  ?  ?  ?  ?  ?  ?  ?  ?  ?  ? ?  ?General Comments   ? ?  ?Exercises    ? ?Assessment/Plan  ?  ?PT Assessment Patient does not need any further PT services  ?PT Problem List   ? ?   ?  ?PT Treatment Interventions     ? ?PT Goals (Current goals can be found in the Care Plan section)  ?Acute Rehab PT Goals ?Patient Stated Goal: Return home ?PT Goal Formulation: With patient ?Time For Goal Achievement: 01/16/22 ?Potential to Achieve Goals: Good ? ?  ?Frequency   ?  ? ? ?Co-evaluation   ?  ?  ?  ?  ? ? ?  ?AM-PAC PT "6 Clicks" Mobility  ?Outcome Measure Help needed turning from your back to your side while in a flat bed without using bedrails?: None ?Help needed moving from lying on your back to sitting on the side of a flat bed without using bedrails?: None ?Help needed moving to and from a bed to a chair (including a wheelchair)?: None ?Help needed standing up from a chair using your arms (e.g., wheelchair or bedside chair)?: None ?Help needed to walk in hospital room?: None ?Help needed climbing 3-5 steps with a railing? : None ?6 Click Score: 24 ? ?  ?End of Session   ?Activity Tolerance: Patient tolerated treatment well ?Patient left: in bed;with call bell/phone within reach ?Nurse Communication: Mobility status ?PT Visit Diagnosis: Unsteadiness on feet (R26.81);Other  abnormalities of gait and mobility (R26.89);Muscle weakness (generalized) (M62.81) ?  ? ?Time: 3474-2595 ?PT Time Calculation (min) (ACUTE ONLY): 12 min ? ? ?Charges:   PT Evaluation ?$PT Eval Low Complexity: 1 Low ?  ?  ?   ? ? ?9:24 AM, 01/16/22 ?Mearl Latin PT, DPT ?Physical Therapist at [facility] ?[facility] ? ? ?

## 2022-01-17 ENCOUNTER — Encounter (HOSPITAL_COMMUNITY): Payer: Self-pay | Admitting: Internal Medicine

## 2022-01-17 ENCOUNTER — Other Ambulatory Visit: Payer: Self-pay

## 2022-01-17 ENCOUNTER — Inpatient Hospital Stay (HOSPITAL_COMMUNITY): Payer: 59 | Admitting: Certified Registered"

## 2022-01-17 ENCOUNTER — Encounter (HOSPITAL_COMMUNITY)
Admission: EM | Disposition: A | Discharge status: Home / Self Care | Payer: Self-pay | Source: Home / Self Care | Attending: Internal Medicine

## 2022-01-17 DIAGNOSIS — R591 Generalized enlarged lymph nodes: Secondary | ICD-10-CM | POA: Diagnosis not present

## 2022-01-17 DIAGNOSIS — F419 Anxiety disorder, unspecified: Secondary | ICD-10-CM | POA: Diagnosis present

## 2022-01-17 DIAGNOSIS — N179 Acute kidney failure, unspecified: Secondary | ICD-10-CM | POA: Diagnosis not present

## 2022-01-17 DIAGNOSIS — Z8669 Personal history of other diseases of the nervous system and sense organs: Secondary | ICD-10-CM

## 2022-01-17 DIAGNOSIS — E785 Hyperlipidemia, unspecified: Secondary | ICD-10-CM | POA: Diagnosis present

## 2022-01-17 DIAGNOSIS — I10 Essential (primary) hypertension: Secondary | ICD-10-CM | POA: Diagnosis not present

## 2022-01-17 DIAGNOSIS — G9341 Metabolic encephalopathy: Secondary | ICD-10-CM | POA: Diagnosis not present

## 2022-01-17 DIAGNOSIS — R112 Nausea with vomiting, unspecified: Secondary | ICD-10-CM

## 2022-01-17 DIAGNOSIS — R590 Localized enlarged lymph nodes: Secondary | ICD-10-CM

## 2022-01-17 HISTORY — PX: AXILLARY LYMPH NODE BIOPSY: SHX5737

## 2022-01-17 LAB — MAGNESIUM: Magnesium: 1.5 mg/dL — ABNORMAL LOW (ref 1.7–2.4)

## 2022-01-17 LAB — RENAL FUNCTION PANEL
Albumin: 2.8 g/dL — ABNORMAL LOW (ref 3.5–5.0)
Anion gap: 5 (ref 5–15)
BUN: 14 mg/dL (ref 6–20)
CO2: 23 mmol/L (ref 22–32)
Calcium: 9.9 mg/dL (ref 8.9–10.3)
Chloride: 112 mmol/L — ABNORMAL HIGH (ref 98–111)
Creatinine, Ser: 1.62 mg/dL — ABNORMAL HIGH (ref 0.44–1.00)
GFR, Estimated: 36 mL/min — ABNORMAL LOW (ref >60)
Glucose, Bld: 95 mg/dL (ref 70–99)
Phosphorus: 1.9 mg/dL — ABNORMAL LOW (ref 2.5–4.6)
Potassium: 3.3 mmol/L — ABNORMAL LOW (ref 3.5–5.1)
Sodium: 140 mmol/L (ref 135–145)

## 2022-01-17 LAB — HEPATITIS B SURFACE ANTIGEN: Hepatitis B Surface Ag: NONREACTIVE

## 2022-01-17 LAB — LACTATE DEHYDROGENASE: LDH: 173 U/L (ref 98–192)

## 2022-01-17 LAB — HEPATITIS B SURFACE ANTIBODY,QUALITATIVE: Hep B S Ab: NONREACTIVE

## 2022-01-17 LAB — HEPATITIS C ANTIBODY: HCV Ab: NONREACTIVE

## 2022-01-17 LAB — URIC ACID: Uric Acid, Serum: 3.7 mg/dL (ref 2.5–7.1)

## 2022-01-17 SURGERY — AXILLARY LYMPH NODE BIOPSY
Anesthesia: General | Site: Axilla | Laterality: Right

## 2022-01-17 MED ORDER — MIDAZOLAM HCL 2 MG/2ML IJ SOLN
INTRAMUSCULAR | Status: AC
  Filled 2022-01-17: qty 2

## 2022-01-17 MED ORDER — ONDANSETRON HCL 4 MG/2ML IJ SOLN: 4.0000 mg | Freq: Once | INTRAMUSCULAR | Status: DC | PRN

## 2022-01-17 MED ORDER — CHLORHEXIDINE GLUCONATE 0.12 % MT SOLN
15.0000 mL | Freq: Once | OROMUCOSAL | Status: AC
  Administered 2022-01-17: 15 mL via OROMUCOSAL

## 2022-01-17 MED ORDER — BUPIVACAINE HCL (PF) 0.5 % IJ SOLN
INTRAMUSCULAR | Status: AC
  Filled 2022-01-17: qty 30

## 2022-01-17 MED ORDER — FENTANYL CITRATE (PF) 100 MCG/2ML IJ SOLN
INTRAMUSCULAR | Status: DC | PRN
  Administered 2022-01-17 (×2): 25 ug via INTRAVENOUS
  Administered 2022-01-17: 50 ug via INTRAVENOUS

## 2022-01-17 MED ORDER — ORAL CARE MOUTH RINSE: 15.0000 mL | Freq: Once | OROMUCOSAL | Status: AC

## 2022-01-17 MED ORDER — ONDANSETRON HCL 4 MG/2ML IJ SOLN
4.0000 mg | Freq: Once | INTRAMUSCULAR | Status: AC
  Administered 2022-01-17: 4 mg via INTRAVENOUS

## 2022-01-17 MED ORDER — MIDAZOLAM HCL 5 MG/5ML IJ SOLN
INTRAMUSCULAR | Status: DC | PRN
  Administered 2022-01-17: 2 mg via INTRAVENOUS

## 2022-01-17 MED ORDER — PROPOFOL 500 MG/50ML IV EMUL
INTRAVENOUS | Status: DC | PRN
  Administered 2022-01-17: 100 ug/kg/min via INTRAVENOUS

## 2022-01-17 MED ORDER — LIDOCAINE 2% (20 MG/ML) 5 ML SYRINGE
INTRAMUSCULAR | Status: DC | PRN
Start: 2022-01-17 — End: 2022-01-17
  Administered 2022-01-17: 60 mg via INTRAVENOUS

## 2022-01-17 MED ORDER — LACTATED RINGERS IV SOLN: INTRAVENOUS | Status: DC

## 2022-01-17 MED ORDER — FENTANYL CITRATE (PF) 100 MCG/2ML IJ SOLN
INTRAMUSCULAR | Status: AC
  Filled 2022-01-17: qty 2

## 2022-01-17 MED ORDER — BUPIVACAINE HCL (PF) 0.5 % IJ SOLN
INTRAMUSCULAR | Status: DC | PRN
Start: 2022-01-17 — End: 2022-01-17
  Administered 2022-01-17: 10 mL

## 2022-01-17 MED ORDER — LIDOCAINE HCL (PF) 2 % IJ SOLN
INTRAMUSCULAR | Status: AC
  Filled 2022-01-17: qty 5

## 2022-01-17 MED ORDER — FENTANYL CITRATE PF 50 MCG/ML IJ SOSY: 25.0000 ug | PREFILLED_SYRINGE | INTRAMUSCULAR | Status: DC | PRN

## 2022-01-17 MED ORDER — ONDANSETRON HCL 4 MG/2ML IJ SOLN
INTRAMUSCULAR | Status: DC | PRN
Start: 2022-01-17 — End: 2022-01-17
  Administered 2022-01-17: 4 mg via INTRAVENOUS

## 2022-01-17 MED ORDER — ALPRAZOLAM 1 MG PO TABS
1.0000 mg | ORAL_TABLET | Freq: Four times a day (QID) | ORAL | Status: DC | PRN
  Administered 2022-01-17 – 2022-01-18 (×2): 1 mg via ORAL
  Filled 2022-01-17 (×2): qty 1

## 2022-01-17 MED ORDER — PROPOFOL 10 MG/ML IV BOLUS
INTRAVENOUS | Status: AC
  Filled 2022-01-17: qty 20

## 2022-01-17 MED ORDER — LIDOCAINE HCL (PF) 1 % IJ SOLN
INTRAMUSCULAR | Status: DC | PRN
  Administered 2022-01-17: 10 mL

## 2022-01-17 MED ORDER — 0.9 % SODIUM CHLORIDE (POUR BTL) OPTIME
TOPICAL | Status: DC | PRN
  Administered 2022-01-17: 1000 mL

## 2022-01-17 MED ORDER — MAGNESIUM SULFATE 4 GM/100ML IV SOLN
4.0000 g | Freq: Once | INTRAVENOUS | Status: AC
  Administered 2022-01-17: 4 g via INTRAVENOUS
  Filled 2022-01-17: qty 100

## 2022-01-17 MED ORDER — POTASSIUM PHOSPHATES 15 MMOLE/5ML IV SOLN
30.0000 mmol | Freq: Once | INTRAVENOUS | Status: AC
  Administered 2022-01-17: 30 mmol via INTRAVENOUS
  Filled 2022-01-17: qty 10

## 2022-01-17 MED ORDER — LIDOCAINE HCL (PF) 1 % IJ SOLN
INTRAMUSCULAR | Status: AC
  Filled 2022-01-17: qty 30

## 2022-01-17 MED ORDER — AMLODIPINE BESYLATE 5 MG PO TABS
5.0000 mg | ORAL_TABLET | Freq: Every day | ORAL | Status: DC
  Administered 2022-01-17: 5 mg via ORAL
  Filled 2022-01-17: qty 1

## 2022-01-17 MED ORDER — VANCOMYCIN HCL IN DEXTROSE 1-5 GM/200ML-% IV SOLN
1000.0000 mg | Freq: Once | INTRAVENOUS | Status: AC
  Administered 2022-01-17: 1000 mg via INTRAVENOUS
  Filled 2022-01-17: qty 200

## 2022-01-17 MED ORDER — PROPOFOL 10 MG/ML IV BOLUS
INTRAVENOUS | Status: DC | PRN
Start: 2022-01-17 — End: 2022-01-17
  Administered 2022-01-17: 40 mg via INTRAVENOUS

## 2022-01-17 MED ORDER — CHLORHEXIDINE GLUCONATE 0.12 % MT SOLN
OROMUCOSAL | Status: AC
  Filled 2022-01-17: qty 15

## 2022-01-17 SURGICAL SUPPLY — 37 items
ADH SKN CLS APL DERMABOND .7 (GAUZE/BANDAGES/DRESSINGS) ×1
APL PRP STRL LF DISP 70% ISPRP (MISCELLANEOUS) ×1
BLADE SURG 15 STRL LF DISP TIS (BLADE) ×1 IMPLANT
BLADE SURG 15 STRL SS (BLADE) ×2
CHLORAPREP W/TINT 26 (MISCELLANEOUS) ×2 IMPLANT
CLOTH BEACON ORANGE TIMEOUT ST (SAFETY) ×2 IMPLANT
CNTNR URN SCR LID CUP LEK RST (MISCELLANEOUS) ×1 IMPLANT
CONT SPEC 4OZ STRL OR WHT (MISCELLANEOUS) ×2
COVER LIGHT HANDLE STERIS (MISCELLANEOUS) ×4 IMPLANT
DECANTER SPIKE VIAL GLASS SM (MISCELLANEOUS) ×2 IMPLANT
DERMABOND ADVANCED (GAUZE/BANDAGES/DRESSINGS) ×1
DERMABOND ADVANCED .7 DNX12 (GAUZE/BANDAGES/DRESSINGS) ×1 IMPLANT
ELECT REM PT RETURN 9FT ADLT (ELECTROSURGICAL) ×2
ELECTRODE REM PT RTRN 9FT ADLT (ELECTROSURGICAL) ×1 IMPLANT
GLOVE BIOGEL PI IND STRL 6.5 (GLOVE) ×1 IMPLANT
GLOVE BIOGEL PI IND STRL 7.0 (GLOVE) ×2 IMPLANT
GLOVE BIOGEL PI INDICATOR 6.5 (GLOVE) ×1
GLOVE BIOGEL PI INDICATOR 7.0 (GLOVE) ×2
GLOVE ECLIPSE 6.5 STRL STRAW (GLOVE) ×1 IMPLANT
GLOVE SURG SS PI 6.5 STRL IVOR (GLOVE) ×2 IMPLANT
GOWN STRL REUS W/TWL LRG LVL3 (GOWN DISPOSABLE) ×4 IMPLANT
INST SET MINOR GENERAL (KITS) ×2 IMPLANT
KIT TURNOVER KIT A (KITS) ×2 IMPLANT
MANIFOLD NEPTUNE II (INSTRUMENTS) ×2 IMPLANT
NDL HYPO 25X1 1.5 SAFETY (NEEDLE) ×1 IMPLANT
NEEDLE HYPO 25X1 1.5 SAFETY (NEEDLE) ×2 IMPLANT
NS IRRIG 1000ML POUR BTL (IV SOLUTION) ×2 IMPLANT
PACK MINOR (CUSTOM PROCEDURE TRAY) ×2 IMPLANT
PAD ARMBOARD 7.5X6 YLW CONV (MISCELLANEOUS) ×2 IMPLANT
SET BASIN LINEN APH (SET/KITS/TRAYS/PACK) ×2 IMPLANT
SPONGE INTESTINAL PEANUT (DISPOSABLE) ×1 IMPLANT
SPONGE T-LAP 18X18 ~~LOC~~+RFID (SPONGE) ×2 IMPLANT
SUT MNCRL AB 4-0 PS2 18 (SUTURE) ×2 IMPLANT
SUT VIC AB 3-0 SH 27 (SUTURE) ×4
SUT VIC AB 3-0 SH 27X BRD (SUTURE) ×1 IMPLANT
SYR BULB IRRIG 60ML STRL (SYRINGE) ×1 IMPLANT
SYR CONTROL 10ML LL (SYRINGE) ×2 IMPLANT

## 2022-01-17 NOTE — Progress Notes (Signed)
?  Progress Note ? ? ?PatientMarland Kitchen Makinlee Padilla TDS:[id number] DOB: March 24, 1961 DOA: 01/13/2022     4 ?DOS: the patient was seen and examined on 01/17/2022 ?  ?Brief hospital course: ?No notes on file ? ?Assessment and Plan: ?* Hypercalcemia ?-Etiology is unclear ?-Vitamin D 25 hydroxy in normal range at 59 ?-Vitamin D 1,25 normal range 65.7 ?-PTH low at 12 ?-PTH-related peptide pending ?-CT chest, abdomen pelvis shows diffuse mild lymphadenopathy suspicious for lymphoproliferative disorder or possible metastatic disease ?-Appreciate oncology input ?-Continue IV fluids with saline ?-She received 1 dose of zoledronic acid on 4/16 ?-She also received calcitonin ?-Serum calcium improving ?-Seen by oncology, ordered SPEP, ACE level, LDH, kappa/lambda light chains, hepatitis C and B  ?-We will likely need outpatient follow-up with oncology after discharge ? ?Hypokalemia ?Replace ? ?AKI (acute kidney injury) ([place]) ?-Likely related to dehydration and hypercalcemia ?-Continue IV fluids and monitor ?-Hold ARB ?-Creatinine 2.4 on admission, now 1.6 ? ?Essential hypertension ?-Holding home ARB in light of acute kidney injury ?-Blood pressures running high ?-Continue metoprolol ?-Adding amlodipine ?-Use hydralazine as needed ? ?Acute metabolic encephalopathy ?-Related to hypercalcemia and dehydration ?-Mental status appears to be slowly improving ?-MRI brain without any acute findings ?-Continue to monitor ? ?Nausea and vomiting ?Had significant nausea and repeated vomiting on 4/17 ?-Abdominal imaging was unremarkable ?-Continue antiemetics ?-Advancing diet as tolerated ? ?Anxiety ?Continue home dose of Xanax ? ?History of Bell's palsy ?-Diagnosed 11/2021 ?-Affecting left side of face ?-Completed a course of prednisone ? ?HLD (hyperlipidemia) ?Continue statin ? ?Hypomagnesemia ?Replace ? ?Hypophosphatemia ?Replace ? ?Lymphadenopathy, generalized ?-Noted on CT chest with concerns for underlying metastatic disease versus  lymphoproliferative disorder ?-Seen by general surgery and underwent axillary lymph node biopsy on 4/18 ? ? ? ? ?  ? ?Subjective: She is feeling better.  Denies any new complaints ? ?Physical Exam: ?Vitals:  ? 01/17/22 1230 01/17/22 1245 01/17/22 1407 01/17/22 1559  ?BP: (!) 165/94 (!) 177/97 (!) 188/97 (!) 165/99  ?Pulse: 70 68 61   ?Resp: '13 18 18   '$ ?Temp:  97.9 ?F (36.6 ?C) 97.8 ?F (36.6 ?C) 98.1 ?F (36.7 ?C)  ?TempSrc:   Oral Oral  ?SpO2: 97% 97% 98% (!) 66%  ?Weight:      ?General exam: Alert, awake, oriented x 3 ?Respiratory system: Clear to auscultation. Respiratory effort normal. ?Cardiovascular system:RRR. No murmurs, rubs, gallops. ?Gastrointestinal system: Abdomen is nondistended, soft and nontender. No organomegaly or masses felt. Normal bowel sounds heard. ?Central nervous system: Alert and oriented. No focal neurological deficits. ?Extremities: No C/C/E, +pedal pulses ?Skin: No rashes, lesions or ulcers ?Psychiatry: Judgement and insight appear normal. Mood & affect appropriate.  ? ?Data Reviewed: ? ?Calcium continues to trend down.  Magnesium is low, phosphorus and potassium are also low.  Creatinine improved with IV fluids ? ?Family Communication: Updated patient's son on 4/18 ? ?Disposition: ?Status is: Inpatient ?Remains inpatient appropriate because: Continued replacement of electrolytes, advancing diet to ensure she can tolerate p.o. ? Planned Discharge Destination: Home ? ? ? ?Time spent: 35 minutes ? ?Author: ?Kathie Dike, MD ?01/17/2022 7:36 PM ? ?For on call review [email or web address].  ?

## 2022-01-17 NOTE — Progress Notes (Signed)
Patient has had two episodes of emesis. MD notified. Received order for compazine. Will continue to monitor.  ?

## 2022-01-17 NOTE — Discharge Instructions (Addendum)
Surgery Discharge Instructions ? ?Activity ? You are advised to go directly home from the hospital.  Resume light activity. No heavy lifting over 10 lbs or strenuous exercise. ? ?Fluids and Diet ?Regular diet ? ?Medications ? If you have not had a bowel movement in 24 hours, take 2 tablespoons over the counter Milk of mag. ?            You May resume your blood thinners tomorrow (Aspirin, coumadin, or other).  ? ?Operative Site ? You have a liquid bandage over your incisions, this will begin to flake off in about a week. Ok to Games developer. Keep wound clean and dry. No baths or swimming. No lifting more than 10 pounds. ? ?Contact Information: ?If you have questions or concerns, please call our office, [phone], Monday- Thursday 8AM-5PM and Friday 8AM-12Noon.  ?If it is after hours or on the weekend, please call [facility] Main Number, [phone], and ask to speak to the surgeon on call for Dr. Okey Dupre at [facility].  ? ?SPECIFIC COMPLICATIONS TO WATCH FOR: ?Inability to urinate ?Fever over 101? F by mouth ?Nausea and vomiting lasting longer than 24 hours. ?Pain not relieved by medication ordered ?Swelling around the operative site ?Increased redness, warmth, hardness, around operative area ?Numbness, tingling, or cold fingers or toes ?Blood -soaked dressing, (small amounts of oozing may be normal) ?Increasing and progressive drainage from surgical area or exam site ? ?

## 2022-01-17 NOTE — Assessment & Plan Note (Signed)
-  Diagnosed 11/2021 ?-Affecting left side of face ?-Completed a course of prednisone ?

## 2022-01-17 NOTE — Anesthesia Postprocedure Evaluation (Signed)
Anesthesia Post Note ? ?Patient: Leah Padilla ? ?Procedure(s) Performed: AXILLARY LYMPH NODE BIOPSY (Right: Axilla) ? ?Patient location during evaluation: Phase II ?Anesthesia Type: General ?Level of consciousness: awake ?Pain management: pain level controlled ?Vital Signs Assessment: post-procedure vital signs reviewed and stable ?Respiratory status: spontaneous breathing and respiratory function stable ?Cardiovascular status: blood pressure returned to baseline and stable ?Postop Assessment: no headache and no apparent nausea or vomiting ?Anesthetic complications: no ?Comments: Late entry ? ? ?No notable events documented. ? ? ?Last Vitals:  ?Vitals:  ? 01/17/22 1245 01/17/22 1407  ?BP: (!) 177/97 (!) 188/97  ?Pulse: 68 61  ?Resp: 18 18  ?Temp: 36.6 ?C 36.6 ?C  ?SpO2: 97% 98%  ?  ?Last Pain:  ?Vitals:  ? 01/17/22 1407  ?TempSrc: Oral  ?PainSc:   ? ? ?  ?  ?  ?  ?  ?  ? ?Louann Sjogren ? ? ? ? ?

## 2022-01-17 NOTE — Consult Note (Signed)
[facility] Consult ? ?Reason for Consult: Axillary lymphadenopathy ?Referring Physician: Dr. Roderic Palau ? ?Chief Complaint   ?Hypertension ?  ? ? ?HPI: Leah Padilla is a 61 y.o. female who presented to the hospital with hypertension and was noted to have severe hypercalcemia.  She is undergone imaging work-up since being admitted to the hospital, including CT head, CT chest, CT abdomen and pelvis, and MRI of the brain.  She was noted to have diffuse mild lymphadenopathy suspicious for lymphoproliferative proliferative disorder.  Her surgical history significant for cyst excision on her neck.  She denies use of blood thinning medications.  She denies use of tobacco products.  She denies nausea, vomiting, and abdominal pain currently.  She denies fevers and chills. ? ?General surgery was consulted to obtain axillary lymph node biopsy to evaluate for lymphoproliferative disorder. ? ?Past Medical History:  ?Diagnosis Date  ? Anxiety   ? Bell's palsy   ? Depression   ? High cholesterol   ? Hypertension   ? ? ?Past Surgical History:  ?Procedure Laterality Date  ? COLONOSCOPY N/A 04/17/2013  ? Procedure: COLONOSCOPY;  Surgeon: Rogene Houston, MD;  Location: [facility];  Service: Endoscopy;  Laterality: N/A;  930  ? CYST EXCISION    ? neck  ? ? ?Family History  ?Problem Relation Age of Onset  ? Stroke Other   ? Diabetes Other   ? Stroke Father   ? Breast cancer Mother   ? Breast cancer Sister   ? ? ?Social History  ? ?Tobacco Use  ? Smoking status: Former  ?  Packs/day: 0.50  ?  Years: 13.00  ?  Pack years: 6.50  ?  Types: Cigarettes  ?  Quit date: 12/13/2016  ?  Years since quitting: 5.0  ? Smokeless tobacco: Never  ?Vaping Use  ? Vaping Use: Former  ?Substance Use Topics  ? Alcohol use: Not Currently  ?  Comment: occasional  ? Drug use: No  ? ? ?Medications: I have reviewed the patient's current medications. ? ?Allergies  ?Allergen Reactions  ? Ciprofloxacin Other (See Comments)  ?  Dry mouth, skin  tingling, panicky, stomach pain, anxious and nervous  ? Doxycycline   ? Estrace [Estradiol]   ? Penicillins   ? Polymox [Amoxicillin] Hives  ? Progesterone   ? ? ? ?ROS:  ?Constitutional: positive for fatigue, negative for chills and fevers ?Respiratory: negative for shortness of breath ?Cardiovascular: negative for chest pain ?Gastrointestinal: negative for abdominal pain, nausea, and vomiting ?Musculoskeletal:negative for neck pain ? ?Blood pressure (!) 161/85, pulse 80, temperature 98.4 ?F (36.9 ?C), temperature source Oral, resp. rate 16, weight 85.7 kg, SpO2 98 %. ?Physical Exam ?Vitals reviewed.  ?Constitutional:   ?   Appearance: Normal appearance.  ?HENT:  ?   Head: Normocephalic and atraumatic.  ?Eyes:  ?   Extraocular Movements: Extraocular movements intact.  ?   Pupils: Pupils are equal, round, and reactive to light.  ?Cardiovascular:  ?   Rate and Rhythm: Normal rate.  ?Pulmonary:  ?   Effort: Pulmonary effort is normal.  ?Abdominal:  ?   General: There is no distension.  ?   Palpations: Abdomen is soft.  ?   Tenderness: There is no abdominal tenderness.  ?Musculoskeletal:     ?   General: Normal range of motion.  ?   Cervical back: Normal range of motion.  ?Skin: ?   General: Skin is warm and dry.  ?   Comments: Mild right axillary  lymphadenopathy palpable, nontender  ?Neurological:  ?   General: No focal deficit present.  ?   Mental Status: She is alert and oriented to person, place, and time.  ?Psychiatric:     ?   Mood and Affect: Mood normal.     ?   Behavior: Behavior normal.  ? ? ?Results: ?Results for orders placed or performed during the hospital encounter of 01/13/22 (from the past 48 hour(s))  ?Renal function panel     Status: Abnormal  ? Collection Time: 01/16/22  6:22 AM  ?Result Value Ref Range  ? Sodium 139 135 - 145 mmol/L  ? Potassium 3.6 3.5 - 5.1 mmol/L  ?  Comment: DELTA CHECK NOTED  ? Chloride 111 98 - 111 mmol/L  ? CO2 24 22 - 32 mmol/L  ? Glucose, Bld 109 (H) 70 - 99 mg/dL  ?   Comment: Glucose reference range applies only to samples taken after fasting for at least 8 hours.  ? BUN 15 6 - 20 mg/dL  ? Creatinine, Ser 1.81 (H) 0.44 - 1.00 mg/dL  ? Calcium 11.1 (H) 8.9 - 10.3 mg/dL  ? Phosphorus 2.1 (L) 2.5 - 4.6 mg/dL  ? Albumin 2.8 (L) 3.5 - 5.0 g/dL  ? GFR, Estimated 32 (L) >60 mL/min  ?  Comment: (NOTE) ?Calculated using the CKD-EPI Creatinine Equation (2021) ?  ? Anion gap 4 (L) 5 - 15  ?  Comment: Performed at [facility], [address], [place], [place] [address]  ?Lactate dehydrogenase     Status: None  ? Collection Time: 01/17/22  5:08 AM  ?Result Value Ref Range  ? LDH 173 98 - 192 U/L  ?  Comment: Performed at [facility], [address] [place], [place] [address]  ?Uric acid     Status: None  ? Collection Time: 01/17/22  5:08 AM  ?Result Value Ref Range  ? Uric Acid, Serum 3.7 2.5 - 7.1 mg/dL  ?  Comment: Performed at [facility], [address], [place], [place] [address]  ?Hepatitis B surface antigen     Status: None  ? Collection Time: 01/17/22  5:08 AM  ?Result Value Ref Range  ? Hepatitis B Surface Ag NON REACTIVE NON REACTIVE  ?  Comment: Performed at [facility], [place] [address], [place], [place] [address]  ?Hepatitis B surface antibody,qualitative     Status: None  ? Collection Time: 01/17/22  5:08 AM  ?Result Value Ref Range  ? Hep B S Ab NON REACTIVE NON REACTIVE  ?  Comment: (NOTE) ?Inconsistent with immunity, less than 10 mIU/mL. ? ?Performed at [facility], [place], [place], [place] ?[address] ?  ?Renal function panel     Status: Abnormal  ? Collection Time: 01/17/22  5:08 AM  ?Result Value Ref Range  ? Sodium 140 135 - 145 mmol/L  ? Potassium 3.3 (L) 3.5 - 5.1 mmol/L  ? Chloride 112 (H) 98 - 111 mmol/L  ? CO2 23 22 - 32 mmol/L  ? Glucose, Bld 95 70 - 99 mg/dL  ?  Comment: Glucose reference range applies only to samples taken after fasting for at least 8 hours.  ? BUN 14 6 - 20 mg/dL  ? Creatinine, Ser 1.62 (H) 0.44 - 1.00 mg/dL  ? Calcium 9.9  8.9 - 10.3 mg/dL  ? Phosphorus 1.9 (L) 2.5 - 4.6 mg/dL  ? Albumin 2.8 (L) 3.5 - 5.0 g/dL  ? GFR, Estimated 36 (L) >60 mL/min  ?  Comment: (NOTE) ?Calculated using the CKD-EPI Creatinine Equation (2021) ?  ? Anion gap 5 5 - 15  ?  Comment: Performed at [facility], [address], [place], [place] [address]  ?Magnesium     Status: Abnormal  ? Collection Time: 01/17/22  5:08 AM  ?Result Value Ref Range  ? Magnesium 1.5 (L) 1.7 - 2.4 mg/dL  ?  Comment: Performed at [facility], [address], [place], [place] [address]  ? ? ?CT CHEST WO CONTRAST ? ?Result Date: 01/15/2022 ?CLINICAL DATA:  Chest wall pain and suspected infection. Worsening weakness and lethargy for several weeks. Acute renal failure. EXAM: CT CHEST, ABDOMEN AND PELVIS WITHOUT CONTRAST TECHNIQUE: Multidetector CT imaging of the chest, abdomen and pelvis was performed following the standard protocol without IV contrast. RADIATION DOSE REDUCTION: This exam was performed according to the departmental dose-optimization program which includes automated exposure control, adjustment of the mA and/or kV according to patient size and/or use of iterative reconstruction technique. COMPARISON:  [facility] only CT on 09/12/2015 FINDINGS: CT CHEST FINDINGS Cardiovascular: No acute findings. Mediastinum/Lymph Nodes: Mild bilateral hilar and subpectoral lymphadenopathy is seen bilaterally. Largest index lymph node in the right axilla measures 1.5 cm on image 38/2. Multiple sub-cm mediastinal lymph nodes are noted, none of which are pathologically enlarged. No definite hilar lymphadenopathy identified although mild hilar lymphadenopathy is difficult to exclude on this unenhanced exam. Lungs/Pleura: Mild bandlike opacities in both lower lobes may be due to mild scarring or atelectasis. No evidence of infiltrate, mass, or pleural effusion. Musculoskeletal:  No suspicious bone lesions identified. CT ABDOMEN AND PELVIS FINDINGS Hepatobiliary: No masses visualized on this unenhanced  exam. Gallbladder is unremarkable. No evidence of biliary ductal dilatation. Pancreas: No mass or inflammatory changes identified on this unenhanced exam. Spleen:  Within normal limits in size. Adrenals/Urinary

## 2022-01-17 NOTE — Assessment & Plan Note (Signed)
-  Holding home ARB in light of acute kidney injury ?-Blood pressures running high ?-Continue metoprolol ?-Adding amlodipine ?-Use hydralazine as needed ?

## 2022-01-17 NOTE — Assessment & Plan Note (Signed)
-  Likely related to dehydration and hypercalcemia ?-Continue IV fluids and monitor ?-Hold ARB ?-Creatinine 2.4 on admission, now 1.6 ?

## 2022-01-17 NOTE — Assessment & Plan Note (Signed)
Continue statin. 

## 2022-01-17 NOTE — Progress Notes (Signed)
Pt was transferred to preop. Magnesium was paused until pt returns. ?

## 2022-01-17 NOTE — Progress Notes (Signed)
Update Note: ? ?Called to update the patient's son, Leah Padilla.  I explained that she tolerated the surgery well, and that I was able to remove the right axillary lymph node.  It has been sent to pathology, and hopefully, we will have results by the end of the week.  I further explained that she will be staying in the hospital for at least another day to make sure that she is tolerating oral intake and that her kidney function is continuing to improve.  She has dissolvable stitches under the skin and overlying skin glue that will flake off in 10-14 days.  I will do a phone follow up with her in 2 weeks.  All questions answered to his expressed satisfaction. ? ?-Regular diet ?-Pain control ?-Remainder of care per primary team ? ?Orlanda Lemmerman, DO ?[facility] ?[place], [place] [address] ?[phone] (office) ? ?

## 2022-01-17 NOTE — Op Note (Signed)
[facility] ?Operative Note ? ?01/17/22 ? ?Preoperative Diagnosis: right axillary lymphadenopathy ?  ?Postoperative Diagnosis: Same ?  ?Procedure(s) Performed: Excision of right axillary lymph node ?  ?Surgeon: Graciella Freer, DO  ?  ?Assistants: No qualified resident was available  ?  ?Anesthesia: General endotracheal ?  ?Anesthesiologist: Louann Sjogren, MD  ?  ?Specimens: Right axillary lymph node ?  ?Estimated Blood Loss: Minimal ?  ?Blood Replacement: None  ?  ?Complications: None  ? ?Wound Class: Clean ?  ?Operative Indications: Patient is a 61 year old female who was admitted to the hospital with hypertension and hypercalcemia.  She underwent body imaging which demonstrated diffuse mild lymphadenopathy, concerning for lymphoproliferative disorder.  Oncology requested right axillary lymph node biopsy.  Patient is agreeable to surgery. ? ?All risks, benefits, and alternatives to excision of right axillary lymph node were discussed with the patient, all of her questions were answered to her expressed satisfaction. The patient expresses she wishes to proceed, and informed consent was obtained. ? ?Findings: Right axillary lymphadenopathy ?  ?Procedure: The patient was taken to the operating room and placed supine. General endotracheal anesthesia was induced. Intravenous antibiotics were administered per protocol.  The right axilla and arm were prepared and draped in the usual sterile fashion.  ?  ?An incision was made along the natural skin crease and dissection was carried down until an enlarged lymph node was identified. The node was excised in its entirety assuring hemostasis and ligation of lymph vessels. A conglomerate of fat and lymphatic tissue overlying the lymph node was also removed.  There were no palpable nodes that were grossly 1cm or greater. The lymph node and the fatty tissue were sent fresh to pathology for evaluation.  The incision was irrigate with warm saline.   Hemostasis was achieved.  The clavipectoral fascia was closed with 3-0 Vicryl in an interrupted fashion.  The deep dermis was closed with 3-0 Vicryl in an interrupted fashion, and the skin was closed with 4-0 Monocryl in a subcuticular fashion.  The incision was dressed with Dermabond. ? ?Final inspection revealed acceptable hemostasis. All counts were correct at the end of the case. The patient was awakened from anesthesia without complication.  The patient went to the PACU in stable condition. ?  ?Graciella Freer, DO  ?[facility] ?[place], [place] [address] ?[phone] (office) ? ? ? ?

## 2022-01-17 NOTE — Assessment & Plan Note (Signed)
Had significant nausea and repeated vomiting on 4/17 ?-Abdominal imaging was unremarkable ?-Continue antiemetics ?-Advancing diet as tolerated ?

## 2022-01-17 NOTE — Assessment & Plan Note (Signed)
Replace ?

## 2022-01-17 NOTE — Assessment & Plan Note (Addendum)
-  Etiology is unclear ?-Vitamin D 25 hydroxy in normal range at 59 ?-Vitamin D 1,25 normal range 65.7 ?-PTH low at 12 ?-PTH-related peptide pending ?-CT chest, abdomen pelvis shows diffuse mild lymphadenopathy suspicious for lymphoproliferative disorder or possible metastatic disease ?-Appreciate oncology input ?-Continue IV fluids with saline ?-She received 1 dose of zoledronic acid on 4/16 ?-She also received calcitonin ?-Serum calcium improving ?-Seen by oncology, ordered SPEP, ACE level, LDH, kappa/lambda light chains, hepatitis C and B  ?-We will likely need outpatient follow-up with oncology after discharge ?

## 2022-01-17 NOTE — Assessment & Plan Note (Signed)
-  Noted on CT chest with concerns for underlying metastatic disease versus lymphoproliferative disorder ?-Seen by general surgery and underwent axillary lymph node biopsy on 4/18 ?

## 2022-01-17 NOTE — Assessment & Plan Note (Signed)
-  Related to hypercalcemia and dehydration ?-Mental status appears to be slowly improving ?-MRI brain without any acute findings ?-Continue to monitor ?

## 2022-01-17 NOTE — Progress Notes (Signed)
Telemetry called. Patient HR dropped to 38 nonsustained sinus arrhythmia. MD Zierle-Ghosh notified. Will continue to monitor.  ?

## 2022-01-17 NOTE — Transfer of Care (Signed)
Immediate Anesthesia Transfer of Care Note ? ?Patient: Leah Padilla ? ?Procedure(s) Performed: AXILLARY LYMPH NODE BIOPSY (Right: Axilla) ? ?Patient Location: PACU ? ?Anesthesia Type:MAC ? ?Level of Consciousness: awake, alert , oriented and patient cooperative ? ?Airway & Oxygen Therapy: Patient Spontanous Breathing and Patient connected to nasal cannula oxygen ? ?Post-op Assessment: Report given to RN, Post -op Vital signs reviewed and stable and Patient moving all extremities ? ?Post vital signs: Reviewed and stable ? ?Last Vitals:  ?Vitals Value Taken Time  ?BP    ?Temp    ?Pulse 73 01/17/22 1220  ?Resp 14 01/17/22 1220  ?SpO2 95 % 01/17/22 1220  ?Vitals shown include unvalidated device data. ? ?Last Pain:  ?Vitals:  ? 01/17/22 0910  ?TempSrc:   ?PainSc: 0-No pain  ?   ? ?  ? ?Complications: No notable events documented. ?

## 2022-01-17 NOTE — Assessment & Plan Note (Signed)
Continue home dose of Xanax ?

## 2022-01-17 NOTE — Anesthesia Preprocedure Evaluation (Signed)
Anesthesia Evaluation  ?Patient identified by MRN, date of birth, ID band ?Patient awake ? ? ? ?Reviewed: ?Allergy & Precautions, H&P , NPO status , Patient's Chart, lab work & pertinent test results, reviewed documented beta blocker date and time  ? ?Airway ?Mallampati: II ? ?TM Distance: >3 FB ?Neck ROM: full ? ? ? Dental ?no notable dental hx. ? ?  ?Pulmonary ?neg pulmonary ROS, former smoker,  ?  ?Pulmonary exam normal ?breath sounds clear to auscultation ? ? ? ? ? ? Cardiovascular ?Exercise Tolerance: Good ?hypertension, negative cardio ROS ? ? ?Rhythm:regular Rate:Normal ? ? ?  ?Neuro/Psych ?PSYCHIATRIC DISORDERS Anxiety Depression  Neuromuscular disease   ? GI/Hepatic ?negative GI ROS, Neg liver ROS,   ?Endo/Other  ?negative endocrine ROS ? Renal/GU ?CRF and ARFRenal disease  ?negative genitourinary ?  ?Musculoskeletal ? ? Abdominal ?  ?Peds ? Hematology ?negative hematology ROS ?(+)   ?Anesthesia Other Findings ? ? Reproductive/Obstetrics ?negative OB ROS ? ?  ? ? ? ? ? ? ? ? ? ? ? ? ? ?  ?  ? ? ? ? ? ? ? ? ?Anesthesia Physical ?Anesthesia Plan ? ?ASA: 2 ? ?Anesthesia Plan: General  ? ?Post-op Pain Management:   ? ?Induction:  ? ?PONV Risk Score and Plan: Propofol infusion ? ?Airway Management Planned:  ? ?Additional Equipment:  ? ?Intra-op Plan:  ? ?Post-operative Plan:  ? ?Informed Consent: I have reviewed the patients History and Physical, chart, labs and discussed the procedure including the risks, benefits and alternatives for the proposed anesthesia with the patient or authorized representative who has indicated his/her understanding and acceptance.  ? ? ? ?Dental Advisory Given ? ?Plan Discussed with: CRNA ? ?Anesthesia Plan Comments:   ? ? ? ? ? ? ?Anesthesia Quick Evaluation ? ?

## 2022-01-18 ENCOUNTER — Encounter (HOSPITAL_COMMUNITY): Payer: Self-pay | Admitting: Surgery

## 2022-01-18 LAB — CBC
HCT: 26 % — ABNORMAL LOW (ref 36.0–46.0)
HCT: 26 % — ABNORMAL LOW (ref 36.0–46.0)
Hemoglobin: 8.3 g/dL — ABNORMAL LOW (ref 12.0–15.0)
Hemoglobin: 8.5 g/dL — ABNORMAL LOW (ref 12.0–15.0)
MCH: 27.9 pg (ref 26.0–34.0)
MCH: 28.4 pg (ref 26.0–34.0)
MCHC: 31.9 g/dL (ref 30.0–36.0)
MCHC: 32.7 g/dL (ref 30.0–36.0)
MCV: 87.2 fL (ref 80.0–100.0)
MCV: 87 fL (ref 80.0–100.0)
Platelets: 419 10*3/uL — ABNORMAL HIGH (ref 150–400)
Platelets: 409 10*3/uL — ABNORMAL HIGH (ref 150–400)
RBC: 2.98 MIL/uL — ABNORMAL LOW (ref 3.87–5.11)
RBC: 2.99 MIL/uL — ABNORMAL LOW (ref 3.87–5.11)
RDW: 16 % — ABNORMAL HIGH (ref 11.5–15.5)
RDW: 16.2 % — ABNORMAL HIGH (ref 11.5–15.5)
WBC: 11.7 10*3/uL — ABNORMAL HIGH (ref 4.0–10.5)
WBC: 12 10*3/uL — ABNORMAL HIGH (ref 4.0–10.5)
nRBC: 0 % (ref 0.0–0.2)
nRBC: 0 % (ref 0.0–0.2)

## 2022-01-18 LAB — RENAL FUNCTION PANEL
Albumin: 2.6 g/dL — ABNORMAL LOW (ref 3.5–5.0)
Anion gap: 5 (ref 5–15)
BUN: 13 mg/dL (ref 6–20)
CO2: 22 mmol/L (ref 22–32)
Calcium: 9.1 mg/dL (ref 8.9–10.3)
Chloride: 109 mmol/L (ref 98–111)
Creatinine, Ser: 1.38 mg/dL — ABNORMAL HIGH (ref 0.44–1.00)
GFR, Estimated: 44 mL/min — ABNORMAL LOW (ref >60)
Glucose, Bld: 107 mg/dL — ABNORMAL HIGH (ref 70–99)
Phosphorus: 3 mg/dL (ref 2.5–4.6)
Potassium: 2.9 mmol/L — ABNORMAL LOW (ref 3.5–5.1)
Sodium: 136 mmol/L (ref 135–145)

## 2022-01-18 LAB — ANGIOTENSIN CONVERTING ENZYME: Angiotensin-Converting Enzyme: 73 U/L (ref 14–82)

## 2022-01-18 LAB — PROTEIN ELECTROPHORESIS, SERUM
A/G Ratio: 0.9 (ref 0.7–1.7)
Albumin ELP: 3 g/dL (ref 2.9–4.4)
Alpha-1-Globulin: 0.2 g/dL (ref 0.0–0.4)
Alpha-2-Globulin: 0.7 g/dL (ref 0.4–1.0)
Beta Globulin: 0.7 g/dL (ref 0.7–1.3)
Gamma Globulin: 1.7 g/dL (ref 0.4–1.8)
Globulin, Total: 3.2 g/dL (ref 2.2–3.9)
Total Protein ELP: 6.2 g/dL (ref 6.0–8.5)

## 2022-01-18 LAB — KAPPA/LAMBDA LIGHT CHAINS
Kappa free light chain: 64.2 mg/L — ABNORMAL HIGH (ref 3.3–19.4)
Kappa, lambda light chain ratio: 1.46 (ref 0.26–1.65)
Lambda free light chains: 44.1 mg/L — ABNORMAL HIGH (ref 5.7–26.3)

## 2022-01-18 LAB — MAGNESIUM: Magnesium: 2.1 mg/dL (ref 1.7–2.4)

## 2022-01-18 MED ORDER — POTASSIUM CHLORIDE CRYS ER 20 MEQ PO TBCR
40.0000 meq | EXTENDED_RELEASE_TABLET | ORAL | Status: AC
  Administered 2022-01-18 (×2): 40 meq via ORAL
  Filled 2022-01-18 (×2): qty 2

## 2022-01-18 MED ORDER — AMLODIPINE BESYLATE 5 MG PO TABS
10.0000 mg | ORAL_TABLET | Freq: Every day | ORAL | Status: DC
  Administered 2022-01-18 – 2022-01-19 (×2): 10 mg via ORAL
  Filled 2022-01-18 (×2): qty 2

## 2022-01-18 MED ORDER — OXYCODONE HCL 5 MG PO TABS
5.0000 mg | ORAL_TABLET | ORAL | Status: DC | PRN
  Administered 2022-01-18 – 2022-01-19 (×2): 5 mg via ORAL
  Filled 2022-01-18 (×2): qty 1

## 2022-01-18 MED ORDER — ACETAMINOPHEN 325 MG PO TABS
650.0000 mg | ORAL_TABLET | Freq: Four times a day (QID) | ORAL | Status: DC
  Administered 2022-01-18 – 2022-01-19 (×4): 650 mg via ORAL
  Filled 2022-01-18 (×5): qty 2

## 2022-01-18 NOTE — Progress Notes (Signed)
[facility] Progress Note ? ?1 Day Post-Op  ?Subjective: ?Patient seen and examined.  She is doing well at this time, but states that she had significant pain in her right axilla starting yesterday evening.  She noted that she had increased swelling at the area overnight.  She denies feeling lightheaded or dizzy.  She denies fevers and chills.  She was able to tolerate a diet without nausea or vomiting. ? ?Objective: ?Vital signs in last 24 hours: ?Temp:  [97.8 ?F (36.6 ?C)-98.2 ?F (36.8 ?C)] 98 ?F (36.7 ?C) (04/19 0800) ?Pulse Rate:  [61-91] 87 (04/19 0800) ?Resp:  [13-22] 16 (04/19 0547) ?BP: (141-200)/(69-99) 168/69 (04/19 0800) ?SpO2:  [66 %-98 %] 94 % (04/19 0800) ?Last BM Date : 01/16/22 ? ?Intake/Output from previous day: ?04/18 0701 - 04/19 0700 ?In: 4985.4 [P.O.:960; I.V.:4025.4] ?Out: 20 [Blood:20] ?Intake/Output this shift: ?Total I/O ?In: 240 [P.O.:240] ?Out: -  ? ?General appearance: alert, cooperative, and no distress ?Chest wall: Right axilla with significant ecchymosis and palpable fullness, with underlying hematoma.  Ecchymosis and hematoma do not extend onto the back.  Tender to the touch, no drainage from her incision, incision C/D/I with overlying skin glue in place ? ? ? ?Lab Results:  ?Recent Labs  ?  01/18/22 ?0428  ?WBC 11.7*  ?HGB 8.3*  ?HCT 26.0*  ?PLT 419*  ? ?BMET ?Recent Labs  ?  01/17/22 ?1829 01/18/22 ?0428  ?NA 140 136  ?K 3.3* 2.9*  ?CL 112* 109  ?CO2 23 22  ?GLUCOSE 95 107*  ?BUN 14 13  ?CREATININE 1.62* 1.38*  ?CALCIUM 9.9 9.1  ? ?PT/INR ?No results for input(s): LABPROT, INR in the last 72 hours. ? ?Studies/Results: ?MR BRAIN WO CONTRAST ? ?Result Date: 01/16/2022 ?CLINICAL DATA:  61 year old female with weakness and dizziness for days. Lymphadenopathy throughout the chest abdomen and pelvis on recent CT suspicious for lymphoproliferative disorder or perhaps metastatic disease. EXAM: MRI HEAD WITHOUT CONTRAST TECHNIQUE: Multiplanar, multiecho pulse sequences of  the brain and surrounding structures were obtained without intravenous contrast. COMPARISON:  Head CT 01/13/2022 and earlier. FINDINGS: Brain: Normal cerebral volume. No restricted diffusion to suggest acute infarction. No midline shift, mass effect, evidence of mass lesion, ventriculomegaly, extra-axial collection or acute intracranial hemorrhage. Cervicomedullary junction and pituitary are within normal limits. No cortical encephalomalacia or chronic cerebral blood products identified. A small adhesion is suspected within the right ventricle frontal horn (normal variant). Mild for age nonspecific cerebral white matter T2 and FLAIR hyperintensity, most pronounced in the left parietal subcortical white matter on series 12, image 18. Deep gray nuclei, brainstem and cerebellum appear negative. Vascular: Major intracranial vascular flow voids are preserved. Skull and upper cervical spine: Negative visible cervical spine. Visualized bone marrow signal is within normal limits. Sinuses/Orbits: Negative orbits. Paranasal sinuses and mastoids are stable and well aerated. Other: Grossly normal visible internal auditory structures. Stylomastoid foramina, visible scalp and face soft tissues appear negative. IMPRESSION: No acute intracranial abnormality. Unremarkable for age noncontrast MRI appearance of the brain. Note that early metastatic disease to the brain is difficult to exclude in the absence of intravenous contrast. Electronically Signed   By: Genevie Ann M.D.   On: 01/16/2022 10:50   ? ?Anti-infectives: ?Anti-infectives (From admission, onward)  ? ? Start     Dose/Rate Route Frequency Ordered Stop  ? 01/17/22 0930  vancomycin (VANCOCIN) IVPB 1000 mg/200 mL premix       ? 1,000 mg ?200 mL/hr over 60 Minutes Intravenous  Once 01/17/22 9371  01/17/22 1158  ? ?  ? ? ?Assessment/Plan: ? ?Patient is a 61 year old female who is status post right axillary lymph node biopsy on 4/18. ? ?-Patient has subsequently developed a right  axillary hematoma with a drop of her hemoglobin down to 8.3 from 10.2 ?-Will recheck CBC this afternoon to evaluate hemoglobin ?-Photodocumentation obtained of the right axilla to monitor for progression of the hematoma ?-No plans for surgical intervention at this time, but will continue to monitor in the event the patient's vitals worsen or if her hemoglobin continues to drop ?-Mild leukocytosis likely reactive to surgery and hematoma ?-Advised the patient to decrease motion of her right upper extremity to decrease risk of further trauma to the area ?-Appreciate hospitalist recommendations ? ? LOS: 5 days  ? ? ?Kinston Magnan A Lambert Jeanty ?01/18/2022 ? ?

## 2022-01-18 NOTE — Discharge Summary (Addendum)
Physician Discharge Summary  ?Leah Padilla YQM:[id number] DOB: [birth year] DOA: 01/13/2022 ? ?PCP: Redmond School, MD ? ?Admit date: 01/13/2022 ?Discharge date: 01/19/2022 ? ?Admitted From: Home ?Disposition: Home ? ?Recommendations for Outpatient Follow-up:  ?Follow up with PCP in 1-2 weeks ?Please obtain BMP/CBC in one week ?Please follow up with surgery as scheduled for biopsy results ? ?Home Health: None ?Equipment/Devices: None ? ?Discharge Condition: Stable ?CODE STATUS: Full ?Diet recommendation: As tolerated ? ?Brief/Interim Summary: ?61 year old female admitted to the hospital with progressive weakness and lethargy for the past 2 to 3 weeks.  She had decreased p.o. intake during this time.  She had recent diagnosis of Bell's palsy in 11/2021.  She was noted to be hypercalcemic and acute renal failure on admission.  Started on IV fluids and admitted for further work-up.  ? ?Hypercalcemia ?-Etiology is unclear ?-Vitamin D 25 hydroxy in normal range at 59 ?-Vitamin D 1,25 normal range 65.7 ?-PTH low at 12 ?-PTH-related peptide pending ?-CT chest, abdomen pelvis shows diffuse mild lymphadenopathy suspicious for lymphoproliferative disorder or possible metastatic disease ?-She received 1 dose of zoledronic acid on 4/16 ?-She also received calcitonin ?-Serum calcium improving ?-Seen by oncology, ordered SPEP, ACE level, LDH, kappa/lambda light chains, hepatitis C and B -most labs negative for any abnormalities, follow-up outpatient for remainder of pending labs and biopsy results ?  ?Hypokalemia ?Replaced ?  ?AKI (acute kidney injury) ([place]) ?-Likely related to dehydration and hypercalcemia ?-Resolved with fluids ?  ?Essential hypertension ?-Holding home ARB in light of acute kidney injury ?-Blood pressures running high ?-Continue metoprolol ?-Adding amlodipine ?-Use hydralazine as needed ?  ?Acute metabolic encephalopathy ?-Related to hypercalcemia and dehydration ?-Mental status appears to be slowly improving ?-MRI  brain without any acute findings ?-Continue to monitor ?  ?Nausea and vomiting ?Had significant nausea and repeated vomiting on 4/17 ?-Abdominal imaging was unremarkable ?-Continue antiemetics ?-Advancing diet as tolerated ?  ?Anxiety ?Continue home dose of Xanax ?  ?History of Bell's palsy ?-Diagnosed 11/2021 ?-Affecting left side of face ?-Completed a course of prednisone ?  ?HLD (hyperlipidemia) ?Continue statin ?  ?Hypomagnesemia ?Replace ?  ?Hypophosphatemia ?Replace ?  ?Lymphadenopathy, generalized ?-Noted on CT chest with concerns for underlying metastatic disease versus lymphoproliferative disorder ?-Seen by general surgery and underwent axillary lymph node biopsy on 4/18 ? ?Discharge Diagnoses:  ?Principal Problem: ?  Hypercalcemia ?Active Problems: ?  AKI (acute kidney injury) ([place]) ?  Hypokalemia ?  Acute metabolic encephalopathy ?  Essential hypertension ?  Lymphadenopathy, generalized ?  Hypophosphatemia ?  Hypomagnesemia ?  HLD (hyperlipidemia) ?  History of Bell's palsy ?  Anxiety ?  Nausea and vomiting ? ? ? ?Discharge Instructions ? ? ?Allergies as of 01/19/2022   ? ?   Reactions  ? Ciprofloxacin Other (See Comments)  ? Dry mouth, skin tingling, panicky, stomach pain, anxious and nervous  ? Doxycycline   ? Estrace [estradiol]   ? Penicillins   ? Polymox [amoxicillin] Hives  ? Progesterone   ? ?  ? ?  ?Medication List  ?  ? ?STOP taking these medications   ? ?losartan 50 MG tablet ?Commonly known as: COZAAR ?  ?phenazopyridine 200 MG tablet ?Commonly known as: Pyridium ?  ?predniSONE 10 MG tablet ?Commonly known as: DELTASONE ?  ?tobramycin-dexamethasone ophthalmic solution ?Commonly known as: TobraDex ?  ? ?  ? ?TAKE these medications   ? ?ALPRAZolam 1 MG tablet ?Commonly known as: Duanne Moron ?Take 1 mg by mouth 4 (four) times daily as needed for anxiety. ?  ?  amLODipine 10 MG tablet ?Commonly known as: NORVASC ?Take 1 tablet (10 mg total) by mouth daily. ?Start taking on: January 20, 2022 ?  ?imipramine 25  MG tablet ?Commonly known as: TOFRANIL ?Take 25 mg by mouth at bedtime. ?  ?metoprolol tartrate 25 MG tablet ?Commonly known as: LOPRESSOR ?Take 1 tablet (25 mg total) by mouth 2 (two) times daily. ?  ?multivitamin tablet ?Take 1 tablet by mouth daily. ?  ?simvastatin 20 MG tablet ?Commonly known as: ZOCOR ?Take 20 mg by mouth daily. ?  ? ?  ? ? Follow-up Information   ? ? Pappayliou, Flint Melter, DO. Call.   ?Specialty: General Surgery ?Why: Call to schedule a phone follow up in 2 weeks ?Contact information: ?[address] ?[place] [address] ?[phone] ? ? ?  ?  ? ?  ?  ? ?  ? ?Allergies  ?Allergen Reactions  ? Ciprofloxacin Other (See Comments)  ?  Dry mouth, skin tingling, panicky, stomach pain, anxious and nervous  ? Doxycycline   ? Estrace [Estradiol]   ? Penicillins   ? Polymox [Amoxicillin] Hives  ? Progesterone   ? ? ?Consultations: ?Oncology, general surgery ? ?Procedures/Studies: ?CT Head Wo Contrast ? ?Result Date: 01/13/2022 ?CLINICAL DATA:  Altered mental status EXAM: CT HEAD WITHOUT CONTRAST TECHNIQUE: Contiguous axial images were obtained from the base of the skull through the vertex without intravenous contrast. RADIATION DOSE REDUCTION: This exam was performed according to the departmental dose-optimization program which includes automated exposure control, adjustment of the mA and/or kV according to patient size and/or use of iterative reconstruction technique. COMPARISON:  12/02/2021 FINDINGS: Brain: No acute intracranial findings are seen. Cortical sulci are prominent. Ventricles are not dilated. There is no shift of midline structures. There are no epidural or subdural fluid collections. Vascular: Unremarkable. Skull: No fracture is seen in the calvarium. Sinuses/Orbits: Mucous retention cyst is seen in the left side of sphenoid sinus. Other: None IMPRESSION: No acute intracranial findings are seen in noncontrast CT brain. Mild chronic sphenoid sinusitis. Electronically Signed   By:  Elmer Picker M.D.   On: 01/13/2022 14:55  ? ?CT CHEST WO CONTRAST ? ?Result Date: 01/15/2022 ?CLINICAL DATA:  Chest wall pain and suspected infection. Worsening weakness and lethargy for several weeks. Acute renal failure. EXAM: CT CHEST, ABDOMEN AND PELVIS WITHOUT CONTRAST TECHNIQUE: Multidetector CT imaging of the chest, abdomen and pelvis was performed following the standard protocol without IV contrast. RADIATION DOSE REDUCTION: This exam was performed according to the departmental dose-optimization program which includes automated exposure control, adjustment of the mA and/or kV according to patient size and/or use of iterative reconstruction technique. COMPARISON:  AP only CT on 09/12/2015 FINDINGS: CT CHEST FINDINGS Cardiovascular: No acute findings. Mediastinum/Lymph Nodes: Mild bilateral hilar and subpectoral lymphadenopathy is seen bilaterally. Largest index lymph node in the right axilla measures 1.5 cm on image 38/2. Multiple sub-cm mediastinal lymph nodes are noted, none of which are pathologically enlarged. No definite hilar lymphadenopathy identified although mild hilar lymphadenopathy is difficult to exclude on this unenhanced exam. Lungs/Pleura: Mild bandlike opacities in both lower lobes may be due to mild scarring or atelectasis. No evidence of infiltrate, mass, or pleural effusion. Musculoskeletal:  No suspicious bone lesions identified. CT ABDOMEN AND PELVIS FINDINGS Hepatobiliary: No masses visualized on this unenhanced exam. Gallbladder is unremarkable. No evidence of biliary ductal dilatation. Pancreas: No mass or inflammatory changes identified on this unenhanced exam. Spleen:  Within normal limits in size. Adrenals/Urinary Tract: 2 mm nonobstructing calculus noted  in the upper pole of the left kidney. No evidence of ureteral calculi or hydronephrosis. Unremarkable appearance of bladder. Stomach/Bowel: No evidence of obstruction, inflammatory process, or abnormal fluid collections.  Vascular/Lymphatic: Mild abdominal lymphadenopathy is seen within the retroperitoneum and small bowel mesentery. Mild pelvic lymphadenopathy is seen throughout the iliac chains and bilateral inguinal regio

## 2022-01-18 NOTE — Progress Notes (Signed)
Morning medication was administered. Pts only complaint was 6/10 pain PRN norco given. Pt denies any anxiety at this time.   ?

## 2022-01-18 NOTE — Progress Notes (Addendum)
?  Progress Note ? ? ?PatientMarland Kitchen Leah Padilla GGY:[id number] DOB: August 16, 1961 DOA: 01/13/2022     5 ?DOS: the patient was seen and examined on 01/18/2022 ?  ?Brief hospital course: ?61 year old female admitted to the hospital with progressive weakness and lethargy for the past 2 to 3 weeks.  She had decreased p.o. intake during this time.  She had recent diagnosis of Bell's palsy in 11/2021.  She was noted to be hypercalcemic and acute renal failure on admission.  Started on IV fluids and admitted for further work-up. ? ?Assessment and Plan: ? ?* Hypercalcemia ?-Etiology remains unclear, lymph node biopsy pending per surgery 01/17/2022 ?-Vitamin D 1, 25 and 25 within normal range ?-PTH low at 12 ?-PTH-related peptide pending ?-CT chest, abdomen pelvis shows diffuse mild lymphadenopathy suspicious for lymphoproliferative disorder or possible metastatic disease ?-She received calcitonin, zoledronic acid on 4/16 ?-Seen by oncology - hepatitis panel negative, ACE level 73, light chains pending LDH, uric acid within normal limits ?-We will likely need outpatient follow-up with oncology after discharge ? ?Diffuse lymphadenopathy, generalized ?-Noted on CT chest with concerns for underlying metastatic disease versus lymphoproliferative disorder ?-Seen by general surgery and underwent axillary lymph node biopsy on 4/18 -moderate postop bruising, monitor overnight for signs or symptoms of bleeding ? ?Hypokalemia/hypomagnesemia ?Repleted, continue to follow ? ?AKI (acute kidney injury) ([place]) ?-Likely related to dehydration and hypercalcemia ?-Continue IV fluids and monitor ?-Hold ARB ?-Creatinine 2.4 on admission, downtrending towards normal ? ?Essential hypertension ?-Discontinue ARB given AKI ?-Continue metoprolol/increased dose of amlodipine ?-Use hydralazine as needed ? ?Acute metabolic encephalopathy, resolved ?-Secondary to hypercalcemia, resolved ?-Imaging negative ? ?Nausea and vomiting, intractable, resolving ?-Resolved,  continue to follow clinically ? ?Anxiety ?Continue home dose of Xanax ? ?History of Bell's palsy ?-Diagnosed 11/2021 ?-Affecting left side of face ?-Completed a course of prednisone ? ?HLD (hyperlipidemia) ?Continue statin ? ?Hypomagnesemia ?Replace ? ?Hypophosphatemia ?Replace ? ?Obesity, BMI 28 ? ?  ? ?Subjective: She is feeling better.  Denies any new complaints ? ?Physical Exam: ?Vitals:  ? 01/17/22 1559 01/17/22 2058 01/18/22 0547 01/18/22 0800  ?BP: (!) 165/99 (!) 200/90 (!) 166/82 (!) 168/69  ?Pulse:  91 82 87  ?Resp:  (!) 22 16   ?Temp: 98.1 ?F (36.7 ?C) 98.2 ?F (36.8 ?C) 97.8 ?F (36.6 ?C) 98 ?F (36.7 ?C)  ?TempSrc: Oral Oral Oral Oral  ?SpO2: (!) 66% 97% 93% 94%  ?Weight:      ?General exam: Alert, awake, oriented x 3 ?Respiratory system: Clear to auscultation. Respiratory effort normal. ?Cardiovascular system:RRR. No murmurs, rubs, gallops. ?Gastrointestinal system: Abdomen is nondistended, soft and nontender. No organomegaly or masses felt. Normal bowel sounds heard. ?Central nervous system: Alert and oriented. No focal neurological deficits. ?Extremities: No C/C/E, +pedal pulses ?Skin: No rashes, lesions or ulcers ?Psychiatry: Judgement and insight appear normal. Mood & affect appropriate.  ? ?Data Reviewed: ? ?Calcium continues to trend down.  Magnesium is low, phosphorus and potassium are also low.  Creatinine improved with IV fluids ? ?Family Communication: Updated patient's son on 4/18 ? ?Disposition: ?Status is: Inpatient ?Remains inpatient appropriate because: Continued replacement of electrolytes, advancing diet to ensure she can tolerate p.o. ? Planned Discharge Destination: Home ? ? ? ?Time spent: 35 minutes ? ?Author: ?Holli Humbles DO ? ?01/18/2022 12:02 PM ? ?For on call review [email or web address].  ?

## 2022-01-19 LAB — CBC
HCT: 25.8 % — ABNORMAL LOW (ref 36.0–46.0)
Hemoglobin: 8.1 g/dL — ABNORMAL LOW (ref 12.0–15.0)
MCH: 27.8 pg (ref 26.0–34.0)
MCHC: 31.4 g/dL (ref 30.0–36.0)
MCV: 88.7 fL (ref 80.0–100.0)
Platelets: 427 K/uL — ABNORMAL HIGH (ref 150–400)
RBC: 2.91 MIL/uL — ABNORMAL LOW (ref 3.87–5.11)
RDW: 16.5 % — ABNORMAL HIGH (ref 11.5–15.5)
WBC: 11.4 K/uL — ABNORMAL HIGH (ref 4.0–10.5)
nRBC: 0 % (ref 0.0–0.2)

## 2022-01-19 LAB — IMMUNOFIXATION ELECTROPHORESIS
IgA: 131 mg/dL (ref 87–352)
IgG (Immunoglobin G), Serum: 1859 mg/dL — ABNORMAL HIGH (ref 586–1602)
IgM (Immunoglobulin M), Srm: 37 mg/dL (ref 26–217)
Total Protein ELP: 6.1 g/dL (ref 6.0–8.5)

## 2022-01-19 MED ORDER — AMLODIPINE BESYLATE 10 MG PO TABS: 10.0000 mg | ORAL_TABLET | Freq: Every day | ORAL | 0 refills | Status: DC

## 2022-01-19 MED ORDER — METOPROLOL TARTRATE 25 MG PO TABS
25.0000 mg | ORAL_TABLET | Freq: Two times a day (BID) | ORAL | 0 refills | Status: DC
Start: 2022-01-19 — End: 2023-03-08

## 2022-01-19 NOTE — Progress Notes (Signed)
Nsg Discharge Note ? ?Admit Date:  01/13/2022 ?Discharge date: 01/19/2022 ?  ?[place] to be D/C'd Home per MD order.  AVS completed. ?Patient/caregiver able to verbalize understanding. ? ?Discharge Medication: ?Allergies as of 01/19/2022   ? ?   Reactions  ? Ciprofloxacin Other (See Comments)  ? Dry mouth, skin tingling, panicky, stomach pain, anxious and nervous  ? Doxycycline   ? Estrace [estradiol]   ? Penicillins   ? Polymox [amoxicillin] Hives  ? Progesterone   ? ?  ? ?  ?Medication List  ?  ? ?STOP taking these medications   ? ?losartan 50 MG tablet ?Commonly known as: COZAAR ?  ?phenazopyridine 200 MG tablet ?Commonly known as: Pyridium ?  ?predniSONE 10 MG tablet ?Commonly known as: DELTASONE ?  ?tobramycin-dexamethasone ophthalmic solution ?Commonly known as: TobraDex ?  ? ?  ? ?TAKE these medications   ? ?ALPRAZolam 1 MG tablet ?Commonly known as: Duanne Moron ?Take 1 mg by mouth 4 (four) times daily as needed for anxiety. ?  ?amLODipine 10 MG tablet ?Commonly known as: NORVASC ?Take 1 tablet (10 mg total) by mouth daily. ?Start taking on: January 20, 2022 ?  ?imipramine 25 MG tablet ?Commonly known as: TOFRANIL ?Take 25 mg by mouth at bedtime. ?  ?metoprolol tartrate 25 MG tablet ?Commonly known as: LOPRESSOR ?Take 1 tablet (25 mg total) by mouth 2 (two) times daily. ?  ?multivitamin tablet ?Take 1 tablet by mouth daily. ?  ?simvastatin 20 MG tablet ?Commonly known as: ZOCOR ?Take 20 mg by mouth daily. ?  ? ?  ? ? ?Discharge Assessment: ?Vitals:  ? 01/18/22 2123 01/19/22 0422  ?BP: (!) 150/87 (!) 159/82  ?Pulse: 92 83  ?Resp: 17 18  ?Temp: 98.7 ?F (37.1 ?C) 98.7 ?F (37.1 ?C)  ?SpO2: 95% 94%  ? Skin clean, dry and intact without evidence of skin break down, no evidence of skin tears noted. ?IV catheter discontinued intact. Site without signs and symptoms of complications - no redness or edema noted at insertion site, patient denies c/o pain - only slight tenderness at site.  Dressing with slight pressure  applied. ? ?D/c Instructions-Education: ?Discharge instructions given to patient/family with verbalized understanding. ?D/c education completed with patient/family including follow up instructions, medication list, d/c activities limitations if indicated, with other d/c instructions as indicated by MD - patient able to verbalize understanding, all questions fully answered. ?Patient instructed to return to ED, call 911, or call MD for any changes in condition.  ?Patient escorted via [place], and D/C home via private auto. ? ?Caryl Asp, LPN ?5/42/7062 37:62 AM   ?

## 2022-01-19 NOTE — Progress Notes (Signed)
[facility] Progress Note ? ?2 Days Post-Op  ?Subjective: ?Patient seen and examined.  She is resting comfortably in bed.  She had no further pain in her axilla yesterday until the evening, once her pain medications have worn off.  She is tolerating a diet without nausea and vomiting.  She denies any lightheadedness or dizziness.  She denies fevers and chills. ? ?Objective: ?Vital signs in last 24 hours: ?Temp:  [98 ?F (36.7 ?C)-98.7 ?F (37.1 ?C)] 98.7 ?F (37.1 ?C) (04/20 0422) ?Pulse Rate:  [78-92] 83 (04/20 0422) ?Resp:  [17-18] 18 (04/20 0422) ?BP: (150-178)/(82-87) 159/82 (04/20 0422) ?SpO2:  [94 %-96 %] 94 % (04/20 0422) ?Last BM Date : 01/18/22 ? ?Intake/Output from previous day: ?04/19 0701 - 04/20 0700 ?In: 960 [P.O.:960] ?Out: -  ?Intake/Output this shift: ?Total I/O ?In: 240 [P.O.:240] ?Out: -  ? ?General appearance: alert, cooperative, and no distress ?Chest wall: Right axilla with slightly worsening ecchymosis, spreading onto back and down side, palpable fullness from underlying hematoma, unchanged from previous examination.  Tender to the touch, no drainage from incision, incisions C/D/I with overlying skin glue in place ? ?Lab Results:  ?Recent Labs  ?  01/18/22 ?1329 01/19/22 ?0424  ?WBC 12.0* 11.4*  ?HGB 8.5* 8.1*  ?HCT 26.0* 25.8*  ?PLT 409* 427*  ? ?BMET ?Recent Labs  ?  01/17/22 ?8119 01/18/22 ?0428  ?NA 140 136  ?K 3.3* 2.9*  ?CL 112* 109  ?CO2 23 22  ?GLUCOSE 95 107*  ?BUN 14 13  ?CREATININE 1.62* 1.38*  ?CALCIUM 9.9 9.1  ? ?PT/INR ?No results for input(s): LABPROT, INR in the last 72 hours. ? ?Studies/Results: ?No results found. ? ?Anti-infectives: ?Anti-infectives (From admission, onward)  ? ? Start     Dose/Rate Route Frequency Ordered Stop  ? 01/17/22 0930  vancomycin (VANCOCIN) IVPB 1000 mg/200 mL premix       ? 1,000 mg ?200 mL/hr over 60 Minutes Intravenous  Once 01/17/22 1478 01/17/22 1158  ? ?  ? ? ?Assessment/Plan: ? ?Patient is a 61 year old female who is status  post right axillary lymph node biopsy on 4/18. ? ?-Ecchymosis from right axillary hematoma has extended, but the axillary hematoma itself is stable ?-Her hemoglobin has remained stable, 8.1 this morning (8.3 and 8.5 yesterday) ?-I explained that the ecchymosis will start to extend down the right side of her chest and onto her back as her body slowly starts to breakdown his hematoma ?-Advised the patient that she may move her right upper extremity, but try to avoid using her right arm as her main support ?-Further advised the patient to either come to my office sooner or present to the emergency department if she starts having significant dizziness, lightheadedness, increased size of her hematoma, fevers/chills, or purulent drainage from her incision site ?-The patient and her son are understanding of this plan, and all of their questions were answered to their expressed satisfaction ?-Patient stable for discharge from general surgery standpoint ?-Patient will follow-up with me in 1 week ? ? LOS: 6 days  ? ? ?Jaidah Lomax A Dilia Alemany ?01/19/2022 ? ?

## 2022-01-19 NOTE — Progress Notes (Signed)
Patient states understanding of discharge instructions.  

## 2022-01-20 ENCOUNTER — Telehealth (INDEPENDENT_AMBULATORY_CARE_PROVIDER_SITE_OTHER): Payer: 59 | Admitting: Surgery

## 2022-01-20 DIAGNOSIS — R591 Generalized enlarged lymph nodes: Secondary | ICD-10-CM

## 2022-01-20 LAB — SURGICAL PATHOLOGY

## 2022-01-20 NOTE — Telephone Encounter (Signed)
Called the patient to update her on the results of her pathology.  Her lymph node does not demonstrate any concerning signs of cancer, but it does demonstrate noncaseating granulomatous inflammation.  I explained to her that this is present in chronic inflammatory's conditions like sarcoidosis, but she will need further work-up with Dr. Delton Coombes to determine what the final diagnosis is.  She is doing well from her pain standpoint with her right axillary hematoma.  She will be following up with me next Thursday 4/27.  All questions were answered to her expressed satisfaction. ? ?Pathology: ?A. LYMPH NODE, AXILLARY, RIGHT, BIOPSY:  ?-  Noncaseating granulomatous inflammation  ? ?Leah Defeo, DO ?[facility] ?[place], [place] [address] ?[phone] (office) ? ?

## 2022-01-23 ENCOUNTER — Encounter (HOSPITAL_COMMUNITY): Payer: Self-pay | Admitting: Hematology

## 2022-01-23 ENCOUNTER — Other Ambulatory Visit: Payer: Self-pay

## 2022-01-23 ENCOUNTER — Inpatient Hospital Stay (HOSPITAL_COMMUNITY): Payer: 59

## 2022-01-23 ENCOUNTER — Inpatient Hospital Stay (HOSPITAL_COMMUNITY): Payer: 59 | Attending: Hematology | Admitting: Hematology

## 2022-01-23 VITALS — BP 143/90 | HR 103 | Temp 97.4°F | Resp 19 | Ht 68.0 in | Wt 150.7 lb

## 2022-01-23 DIAGNOSIS — R591 Generalized enlarged lymph nodes: Secondary | ICD-10-CM | POA: Diagnosis not present

## 2022-01-23 DIAGNOSIS — C801 Malignant (primary) neoplasm, unspecified: Secondary | ICD-10-CM | POA: Insufficient documentation

## 2022-01-23 DIAGNOSIS — R5383 Other fatigue: Secondary | ICD-10-CM | POA: Diagnosis not present

## 2022-01-23 DIAGNOSIS — N179 Acute kidney failure, unspecified: Secondary | ICD-10-CM | POA: Insufficient documentation

## 2022-01-23 DIAGNOSIS — D649 Anemia, unspecified: Secondary | ICD-10-CM | POA: Diagnosis not present

## 2022-01-23 DIAGNOSIS — Z87891 Personal history of nicotine dependence: Secondary | ICD-10-CM | POA: Insufficient documentation

## 2022-01-23 DIAGNOSIS — I10 Essential (primary) hypertension: Secondary | ICD-10-CM | POA: Insufficient documentation

## 2022-01-23 DIAGNOSIS — Z79899 Other long term (current) drug therapy: Secondary | ICD-10-CM | POA: Insufficient documentation

## 2022-01-23 LAB — CBC WITH DIFFERENTIAL/PLATELET
Abs Immature Granulocytes: 0.12 10*3/uL — ABNORMAL HIGH (ref 0.00–0.07)
Basophils Absolute: 0.1 10*3/uL (ref 0.0–0.1)
Basophils Relative: 1 %
Eosinophils Absolute: 0.2 10*3/uL (ref 0.0–0.5)
Eosinophils Relative: 2 %
HCT: 25.1 % — ABNORMAL LOW (ref 36.0–46.0)
Hemoglobin: 8.3 g/dL — ABNORMAL LOW (ref 12.0–15.0)
Immature Granulocytes: 1 %
Lymphocytes Relative: 16 %
Lymphs Abs: 1.7 10*3/uL (ref 0.7–4.0)
MCH: 28.5 pg (ref 26.0–34.0)
MCHC: 33.1 g/dL (ref 30.0–36.0)
MCV: 86.3 fL (ref 80.0–100.0)
Monocytes Absolute: 1.1 10*3/uL — ABNORMAL HIGH (ref 0.1–1.0)
Monocytes Relative: 10 %
Neutro Abs: 7.7 10*3/uL (ref 1.7–7.7)
Neutrophils Relative %: 70 %
Platelets: 511 10*3/uL — ABNORMAL HIGH (ref 150–400)
RBC: 2.91 MIL/uL — ABNORMAL LOW (ref 3.87–5.11)
RDW: 16.8 % — ABNORMAL HIGH (ref 11.5–15.5)
WBC: 10.9 10*3/uL — ABNORMAL HIGH (ref 4.0–10.5)
nRBC: 0 % (ref 0.0–0.2)

## 2022-01-23 LAB — COMPREHENSIVE METABOLIC PANEL
ALT: 26 U/L (ref 0–44)
AST: 30 U/L (ref 15–41)
Albumin: 3.3 g/dL — ABNORMAL LOW (ref 3.5–5.0)
Alkaline Phosphatase: 134 U/L — ABNORMAL HIGH (ref 38–126)
Anion gap: 7 (ref 5–15)
BUN: 13 mg/dL (ref 6–20)
CO2: 21 mmol/L — ABNORMAL LOW (ref 22–32)
Calcium: 9.3 mg/dL (ref 8.9–10.3)
Chloride: 108 mmol/L (ref 98–111)
Creatinine, Ser: 1.45 mg/dL — ABNORMAL HIGH (ref 0.44–1.00)
GFR, Estimated: 42 mL/min — ABNORMAL LOW (ref >60)
Glucose, Bld: 120 mg/dL — ABNORMAL HIGH (ref 70–99)
Potassium: 2.8 mmol/L — ABNORMAL LOW (ref 3.5–5.1)
Sodium: 136 mmol/L (ref 135–145)
Total Bilirubin: 0.6 mg/dL (ref 0.3–1.2)
Total Protein: 7.6 g/dL (ref 6.5–8.1)

## 2022-01-23 LAB — FERRITIN: Ferritin: 219 ng/mL (ref 11–307)

## 2022-01-23 LAB — VITAMIN B12: Vitamin B-12: 540 pg/mL (ref 180–914)

## 2022-01-23 LAB — FOLATE: Folate: 19.4 ng/mL (ref >5.9)

## 2022-01-23 LAB — IRON AND TIBC
Iron: 48 ug/dL (ref 28–170)
Saturation Ratios: 20 % (ref 10.4–31.8)
TIBC: 237 ug/dL — ABNORMAL LOW (ref 250–450)
UIBC: 189 ug/dL

## 2022-01-23 NOTE — Patient Instructions (Signed)
[place] at [facility] ?Discharge Instructions ? ?You were seen and examined today by Dr. Delton Coombes. ? ?Dr. Delton Coombes has reviewed your recent biopsy results. This did not reveal cancer. This may be sarcoidosis. This is a lymph node disorder that is not cancerous, it can impact your calcium levels. ? ?Dr. Delton Coombes has recommended rechecking your calcium levels and iron levels. ? ?Dr. Delton Coombes has recommended a PET scan. A PET scan is a specialized CT scan that illuminates where there is cancer present in your body. ? ?Follow-up as scheduled. ? ? ? ? ?Thank you for choosing [place] at [facility] to provide your oncology and hematology care.  To afford each patient quality time with our provider, please arrive at least 15 minutes before your scheduled appointment time.  ? ?If you have a lab appointment with the [facility] please come in thru the Main Entrance and check in at the main information desk. ? ?You need to re-schedule your appointment should you arrive 10 or more minutes late.  We strive to give you quality time with our providers, and arriving late affects you and other patients whose appointments are after yours.  Also, if you no show three or more times for appointments you may be dismissed from the clinic at the providers discretion.     ?Again, thank you for choosing [facility]  Our hope is that these requests will decrease the amount of time that you wait before being seen by our physicians.       ?_____________________________________________________________ ? ?Should you have questions after your visit to [facility], please contact our office at [phone] and follow the prompts.  Our office hours are 8:00 a.m. and 4:30 p.m. Monday - Friday.  Please note that voicemails left after 4:00 p.m. may not be returned until the following business day.  We are closed weekends and major holidays.  You do have access  to a nurse 24-7, just call the main number to the clinic [phone] and do not press any options, hold on the line and a nurse will answer the phone.   ? ?For prescription refill requests, have your pharmacy contact our office and allow 72 hours.   ? ?Due to Covid, you will need to wear a mask upon entering the hospital. If you do not have a mask, a mask will be given to you at the Main Entrance upon arrival. For doctor visits, patients may have 1 support person age 36 or older with them. For treatment visits, patients can not have anyone with them due to social distancing guidelines and our immunocompromised population.  ? ? ? ?

## 2022-01-23 NOTE — Progress Notes (Signed)
? ?[facility] ?[address] ?[place], [place] [address] ? ? ?CLINIC:  ?Medical Oncology/Hematology ? ?PCP:  ?Redmond School, MD ?[address] / [place] [address]  ?[phone] ? ?REASON FOR VISIT:  ?Follow-up for hypercalcemia of malignancy ? ?PRIOR THERAPY: none ? ?CURRENT THERAPY: under work-up ? ?INTERVAL HISTORY:  ?Leah Padilla, a 61 y.o. 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 Leah Padilla was last seen on 01/16/2022. ? ?Today she reports feeling good. She reports soreness at the right axillary biopsy site. She reports fatigue. Her appetite and eating are improving. She is drinking 36 ounces of water daily. She denies n/v/d, fevers, and chills. She lost 20-30 lbs in 2-3 weeks following Bell's palsy diagnosis. She quit smoking over 5 years ago. She works in a [employer]. She reports possible chemical exposure including IPA and acetone. She denies family history of sarcoidosis. Her mother had multiple myeloma, and she denies any other family history of cancer.  ? ?REVIEW OF SYSTEMS:  ?Review of Systems  ?Constitutional:  Positive for fatigue. Negative for appetite change (improving), chills and fever.  ?Gastrointestinal:  Negative for diarrhea, nausea and vomiting.  ?Musculoskeletal:  Positive for myalgias (R axilla).  ?All other systems reviewed and are negative. ? ?PAST MEDICAL/SURGICAL HISTORY:  ?Past Medical History:  ?Diagnosis Date  ? Anxiety   ? Bell's palsy   ? Depression   ? High cholesterol   ? Hypertension   ? ?Past Surgical History:  ?Procedure Laterality Date  ? AXILLARY LYMPH NODE BIOPSY Right 01/17/2022  ? Procedure: AXILLARY LYMPH NODE BIOPSY;  Surgeon: Rusty Aus, DO;  Location: [facility];  Service: General;  Laterality: Right;  ? COLONOSCOPY N/A 04/17/2013  ? Procedure: COLONOSCOPY;  Surgeon: Rogene Houston, MD;  Location: [facility];  Service: Endoscopy;  Laterality: N/A;  930  ? CYST EXCISION    ? neck  ? ? ?SOCIAL  HISTORY:  ?Social History  ? ?Socioeconomic History  ? Marital status: Divorced  ?  Spouse name: Not on file  ? Number of children: 2  ? Years of education: Not on file  ? Highest education level: Not on file  ?Occupational History  ? Not on file  ?Tobacco Use  ? Smoking status: Former  ?  Packs/day: 0.50  ?  Years: 13.00  ?  Pack years: 6.50  ?  Types: Cigarettes  ?  Quit date: 12/13/2016  ?  Years since quitting: 5.1  ? Smokeless tobacco: Never  ?Vaping Use  ? Vaping Use: Former  ?Substance and Sexual Activity  ? Alcohol use: Not Currently  ?  Comment: occasional  ? Drug use: No  ? Sexual activity: Yes  ?  Birth control/protection: Post-menopausal  ?Other Topics Concern  ? Not on file  ?Social History Narrative  ? Not on file  ? ?Social Determinants of Health  ? ?Financial Resource Strain: Not on file  ?Food Insecurity: Not on file  ?Transportation Needs: Not on file  ?Physical Activity: Not on file  ?Stress: Not on file  ?Social Connections: Not on file  ?Intimate Partner Violence: Not on file  ? ? ?FAMILY HISTORY:  ?Family History  ?Problem Relation Age of Onset  ? Stroke Other   ? Diabetes Other   ? Stroke Father   ? Breast cancer Mother   ? Breast cancer Sister   ? ? ?CURRENT MEDICATIONS:  ?Current Outpatient Medications  ?Medication Sig Dispense Refill  ? ALPRAZolam (XANAX) 1 MG tablet  Take 1 mg by mouth 4 (four) times daily as needed for anxiety.     ? amLODipine (NORVASC) 10 MG tablet Take 1 tablet (10 mg total) by mouth daily. 30 tablet 0  ? imipramine (TOFRANIL) 25 MG tablet Take 25 mg by mouth at bedtime.    ? metoprolol tartrate (LOPRESSOR) 25 MG tablet Take 1 tablet (25 mg total) by mouth 2 (two) times daily. 60 tablet 0  ? Multiple Vitamin (MULTIVITAMIN) tablet Take 1 tablet by mouth daily.    ? simvastatin (ZOCOR) 20 MG tablet Take 20 mg by mouth daily.    ? ?No current facility-administered medications for this visit.  ? ? ?ALLERGIES:  ?Allergies  ?Allergen Reactions  ? Ciprofloxacin Other (See  Comments)  ?  Dry mouth, skin tingling, panicky, stomach pain, anxious and nervous  ? Doxycycline   ? Estrace [Estradiol]   ? Penicillins   ? Polymox [Amoxicillin] Hives  ? Progesterone   ? ? ?PHYSICAL EXAM:  ?Performance status (ECOG): 1 - Symptomatic but completely ambulatory ? ?Vitals:  ? 01/23/22 1201  ?BP: (!) 143/90  ?Pulse: (!) 103  ?Resp: 19  ?Temp: (!) 97.4 ?F (36.3 ?C)  ?SpO2: 99%  ? ?Wt Readings from Last 3 Encounters:  ?01/23/22 150 lb 11.2 oz (68.4 kg)  ?01/13/22 189 lb (85.7 kg)  ?12/02/21 170 lb (77.1 kg)  ? ?Physical Exam ?Vitals reviewed.  ?Constitutional:   ?   Appearance: Normal appearance.  ?Cardiovascular:  ?   Rate and Rhythm: Normal rate and regular rhythm.  ?   Pulses: Normal pulses.  ?   Heart sounds: Normal heart sounds.  ?Pulmonary:  ?   Effort: Pulmonary effort is normal.  ?   Breath sounds: Normal breath sounds.  ?Musculoskeletal:  ?   Right lower leg: No edema.  ?   Left lower leg: No edema.  ?Skin: ?   Comments: Large seroma below incision site at right axilla  ?Neurological:  ?   General: No focal deficit present.  ?   Mental Status: She is alert and oriented to person, place, and time.  ?Psychiatric:     ?   Mood and Affect: Mood normal.     ?   Behavior: Behavior normal.  ? ? ?LABORATORY DATA:  ?I have reviewed the labs as listed.  ? ?  Latest Ref Rng & Units 01/19/2022  ?  4:24 AM 01/18/2022  ?  1:29 PM 01/18/2022  ?  4:28 AM  ?CBC  ?WBC 4.0 - 10.5 K/uL 11.4   12.0   11.7    ?Hemoglobin 12.0 - 15.0 g/dL 8.1   8.5   8.3    ?Hematocrit 36.0 - 46.0 % 25.8   26.0   26.0    ?Platelets 150 - 400 K/uL 427   409   419    ? ? ?  Latest Ref Rng & Units 01/18/2022  ?  4:28 AM 01/17/2022  ?  5:08 AM 01/16/2022  ?  6:22 AM  ?CMP  ?Glucose 70 - 99 mg/dL 107   95   109    ?BUN 6 - 20 mg/dL 13   14   15     ?Creatinine 0.44 - 1.00 mg/dL 1.38   1.62   1.81    ?Sodium 135 - 145 mmol/L 136   140   139    ?Potassium 3.5 - 5.1 mmol/L 2.9   3.3   3.6    ?Chloride 98 - 111 mmol/L 109  112   111    ?CO2 22 - 32  mmol/L 22   23   24     ?Calcium 8.9 - 10.3 mg/dL 9.1   9.9   11.1    ? ?   ?Component Value Date/Time  ? RBC 2.91 (L) 01/19/2022 0424  ? MCV 88.7 01/19/2022 0424  ? MCH 27.8 01/19/2022 0424  ? MCHC 31.4 01/19/2022 0424  ? RDW 16.5 (H) 01/19/2022 0424  ? LYMPHSABS 1.4 12/02/2021 1622  ? MONOABS 1.0 12/02/2021 1622  ? EOSABS 0.2 12/02/2021 1622  ? BASOSABS 0.1 12/02/2021 1622  ? ? ?DIAGNOSTIC IMAGING:  ?I have independently reviewed the scans and discussed with the patient. ?CT Head Wo Contrast ? ?Result Date: 01/13/2022 ?CLINICAL DATA:  Altered mental status EXAM: CT HEAD WITHOUT CONTRAST TECHNIQUE: Contiguous axial images were obtained from the base of the skull through the vertex without intravenous contrast. RADIATION DOSE REDUCTION: This exam was performed according to the departmental dose-optimization program which includes automated exposure control, adjustment of the mA and/or kV according to patient size and/or use of iterative reconstruction technique. COMPARISON:  12/02/2021 FINDINGS: Brain: No acute intracranial findings are seen. Cortical sulci are prominent. Ventricles are not dilated. There is no shift of midline structures. There are no epidural or subdural fluid collections. Vascular: Unremarkable. Skull: No fracture is seen in the calvarium. Sinuses/Orbits: Mucous retention cyst is seen in the left side of sphenoid sinus. Other: None IMPRESSION: No acute intracranial findings are seen in noncontrast CT brain. Mild chronic sphenoid sinusitis. Electronically Signed   By: Elmer Picker M.D.   On: 01/13/2022 14:55  ? ?CT CHEST WO CONTRAST ? ?Result Date: 01/15/2022 ?CLINICAL DATA:  Chest wall pain and suspected infection. Worsening weakness and lethargy for several weeks. Acute renal failure. EXAM: CT CHEST, ABDOMEN AND PELVIS WITHOUT CONTRAST TECHNIQUE: Multidetector CT imaging of the chest, abdomen and pelvis was performed following the standard protocol without IV contrast. RADIATION DOSE  REDUCTION: This exam was performed according to the departmental dose-optimization program which includes automated exposure control, adjustment of the mA and/or kV according to patient size and/or use of iterati

## 2022-01-24 LAB — PTH-RELATED PEPTIDE: PTH-related peptide: <2 pmol/L

## 2022-01-26 ENCOUNTER — Ambulatory Visit (INDEPENDENT_AMBULATORY_CARE_PROVIDER_SITE_OTHER): Payer: 59 | Admitting: Surgery

## 2022-01-26 ENCOUNTER — Encounter: Payer: Self-pay | Admitting: Surgery

## 2022-01-26 ENCOUNTER — Other Ambulatory Visit: Payer: Self-pay

## 2022-01-26 VITALS — BP 155/92 | HR 109 | Temp 98.1°F | Resp 16 | Ht 68.0 in | Wt 148.0 lb

## 2022-01-26 DIAGNOSIS — Z09 Encounter for follow-up examination after completed treatment for conditions other than malignant neoplasm: Secondary | ICD-10-CM

## 2022-01-26 DIAGNOSIS — S40021D Contusion of right upper arm, subsequent encounter: Secondary | ICD-10-CM

## 2022-01-26 NOTE — Patient Instructions (Signed)
Dressing change Instructions: ? ?-Change your dressing daily ?-Remove the packing before your shower ?-Let soapy water run over the area ?-Dry the area and repack ?-Cut a piece of packing 5-6" in length ?-Pack the packing into the opening with clean Q-tip ?-May change overlying gauze as needed when saturated ?

## 2022-01-29 NOTE — Progress Notes (Signed)
[facility] Surgical Clinic Note  ? ?HPI:  ?61 y.o. Female presents to clinic for post-op follow-up s/p right axillary lymph node biopsy on 4/18 with subsequent development of hematoma.  She has been doing well, though she is still very tender in her right axilla.  It started draining blood drainage last night.  She denies lightheadedness and dizziness.  She believes that the swelling is improving.  She denies fever and chills. ? ?Review of Systems:  ?All other review of systems: otherwise negative  ? ?Vital Signs:  ?BP (!) 155/92   Pulse (!) 109   Temp 98.1 ?F (36.7 ?C) (Oral)   Resp 16   Ht '5\' 8"'$  (1.727 m)   Wt 148 lb (67.1 kg)   SpO2 96%   BMI 22.50 kg/m?   ? ?Physical Exam:  ?Physical Exam ?Vitals reviewed.  ?Constitutional:   ?   Appearance: Normal appearance.  ?Skin: ?   Comments: Right axilla with fullness and improving overlying ecchymosis, ecchymosis spreading down right side of abdomen, small opening along incision site with old bloody drainage  ?Neurological:  ?   Mental Status: She is alert.  ? ?Laboratory studies: CBC:  ?Lab Results  ?Component Value Date  ? WBC 10.9 (H) 01/23/2022  ? RBC 2.91 (L) 01/23/2022  ? ?BMP:  ?Lab Results  ?Component Value Date  ? GLUCOSE 120 (H) 01/23/2022  ? CO2 21 (L) 01/23/2022  ? BUN 13 01/23/2022  ? CREATININE 1.45 (H) 01/23/2022  ? CALCIUM 9.3 01/23/2022  ? CALCIUM 12.5 (H) 01/14/2022  ? ?Hemoglobin stable at 8.3 on 4/24 ? ?Imaging:  ?None ? ?Pathology: ?A. LYMPH NODE, AXILLARY, RIGHT, BIOPSY:  ?-  Noncaseating granulomatous inflammation  ?-  See comment  ? ?Assessment:  ?61 y.o. yo Female who presents s/p right axillary lymph node biopsy on 4/18 with subsequent development of hematoma. ? ?Plan:  ?-Incision opened at the site of drainage, small amount of old blood able to be evacuated.  ?-Explained that it will take time to reabsorb the clot ?-Incision opening packed with 1/4" packing.  Advised patient to continue with daily dressing changes to the right  axilla. ?-Call or follow up sooner if begins experiencing bright red bloody drainage, increasing lightheadedness, fever, chills, or purulent drainage ?-Hemoglobin stable at 8.3 on 4/24 ?-Follow up on 5/9 ? ?All of the above recommendations were discussed with the patient and patient's family, and all of patient's and family's questions were answered to their expressed satisfaction. ? ?Graciella Freer, DO ?[facility] ?[place], [place] [address] ?[phone] (office) ?

## 2022-02-02 ENCOUNTER — Ambulatory Visit (HOSPITAL_COMMUNITY)
Admission: RE | Admit: 2022-02-02 | Discharge: 2022-02-02 | Disposition: A | Discharge status: Home / Self Care | Payer: 59 | Source: Ambulatory Visit | Attending: Hematology | Admitting: Hematology

## 2022-02-02 ENCOUNTER — Telehealth: Payer: Self-pay | Admitting: *Deleted

## 2022-02-02 ENCOUNTER — Encounter: Payer: Self-pay | Admitting: *Deleted

## 2022-02-02 DIAGNOSIS — R591 Generalized enlarged lymph nodes: Secondary | ICD-10-CM | POA: Insufficient documentation

## 2022-02-02 MED ORDER — FLUDEOXYGLUCOSE F - 18 (FDG) INJECTION
7.8500 | Freq: Once | INTRAVENOUS | Status: AC | PRN
  Administered 2022-02-02: 7.85 via INTRAVENOUS

## 2022-02-02 NOTE — Telephone Encounter (Signed)
Received call from patient [phone]~ telephone.  ? ?Surgical Date: 01/17/2022 ?Procedure: Axillary Lymph Node Biopsy ? ?Patient seen in office for post op on 01/26/2022 and noted to have blood clot at site of incision. Incision was drained and is left open with 1/4" Iodoform packing for daily dressing changes.  ? ?Patient states that at first, axilla was numb and pain was resolved with IBU/ APAP. However, pain is worsening as numbing is diminishing and is requesting pain medication.  ? ?Dr. Okey Dupre is currently out of the office so message routed to on call provider.  ? ?Allergies: Doxycycline, Penicillins, Ciprofloxacin, Estrace, Amoxicillin, Progesterone ?Pharmacy: [facility] on [address] ? ?

## 2022-02-03 ENCOUNTER — Other Ambulatory Visit (INDEPENDENT_AMBULATORY_CARE_PROVIDER_SITE_OTHER): Payer: 59 | Admitting: General Surgery

## 2022-02-03 DIAGNOSIS — Z09 Encounter for follow-up examination after completed treatment for conditions other than malignant neoplasm: Secondary | ICD-10-CM

## 2022-02-03 MED ORDER — HYDROCODONE-ACETAMINOPHEN 5-325 MG PO TABS: 1.0000 | ORAL_TABLET | ORAL | 0 refills | Status: DC | PRN

## 2022-02-03 NOTE — Progress Notes (Signed)
Norco prescribed for pain. ?

## 2022-02-07 ENCOUNTER — Ambulatory Visit (INDEPENDENT_AMBULATORY_CARE_PROVIDER_SITE_OTHER): Payer: 59 | Admitting: Surgery

## 2022-02-07 ENCOUNTER — Encounter: Payer: Self-pay | Admitting: Surgery

## 2022-02-07 VITALS — BP 132/83 | HR 103 | Temp 98.8°F | Resp 14 | Ht 68.0 in | Wt 146.0 lb

## 2022-02-07 DIAGNOSIS — S40021D Contusion of right upper arm, subsequent encounter: Secondary | ICD-10-CM

## 2022-02-07 NOTE — Progress Notes (Signed)
[facility] Surgical Clinic Note  ? ?HPI:  ?61 y.o. Female presents to clinic for post-op follow-up status post right axillary lymph node biopsy on 4/18 with subsequent development of hematoma.  She has been doing well, and states that her right axilla has had decreased swelling and pain.  She did get a prescription for some pain medications from Dr. Arnoldo Morale last week since, to help with her pain control during dressing changes.  He has had bloody output from her incision.  She denies any fevers or chills.  She denies any purulent drainage from her incision site. ? ?Review of Systems:  ?All other review of systems: otherwise negative  ? ?Vital Signs:  ?BP 132/83   Pulse (!) 103   Temp 98.8 ?F (37.1 ?C) (Oral)   Resp 14   Ht '5\' 8"'$  (1.727 m)   Wt 146 lb (66.2 kg)   SpO2 98%   BMI 22.20 kg/m?   ? ?Physical Exam:  ?Physical Exam ?Vitals reviewed.  ?Constitutional:   ?   Appearance: Normal appearance.  ?Skin: ?   Comments: Right axilla with improved swelling and ecchymosis, incision site with full dehiscence, underlying hematoma noted; complete resolution of the patient's abdominal ecchymosis  ?Neurological:  ?   Mental Status: She is alert.  ? ? ?Laboratory studies: None ? ?Imaging:  ?None ? ?Assessment:  ?61 y.o. yo Female who presents status post right axillary lymph node biopsy on 4/18 with subsequent development of hematoma ? ?Plan:  ?-The incision has fully dehisced, and the underlying hematoma was visible ?-I evacuated as much of the hematoma as I was able to visualize.  I then irrigated the wound with sterile saline.  I packed the wound with damp Kerlix ?-The patient will follow-up with me in 2 days to verify that there is no bleeding from this incision site ?-The patient does not need to change the dressing while at home given the short follow-up with me.  Advised her to change the dressing only if the packing becomes saturated ? ?All of the above recommendations were discussed with the patient and  patient's family, and all of patient's and family's questions were answered to their expressed satisfaction. ? ?Graciella Freer, DO ?[facility] ?[place], [place] [address] ?[phone] (office) ?

## 2022-02-09 ENCOUNTER — Ambulatory Visit (INDEPENDENT_AMBULATORY_CARE_PROVIDER_SITE_OTHER): Payer: 59 | Admitting: Surgery

## 2022-02-09 ENCOUNTER — Encounter: Payer: Self-pay | Admitting: Surgery

## 2022-02-09 VITALS — BP 139/82 | HR 88 | Temp 98.2°F | Resp 16 | Ht 68.0 in | Wt 146.0 lb

## 2022-02-09 DIAGNOSIS — S40021D Contusion of right upper arm, subsequent encounter: Secondary | ICD-10-CM

## 2022-02-09 NOTE — Progress Notes (Signed)
[facility] Surgical Clinic Note  ? ?HPI:  ?61 y.o. Female presents to clinic for post-op follow-up status post right axillary lymph node biopsy on 4/18 with subsequent development of hematoma.  Since her visit 2 days ago, she has been doing well.  She has had a little bit of drainage from her incision site, but is no longer bloody.  She denies any fevers or chills. ? ?Review of Systems:  ?All other review of systems: otherwise negative  ? ?Vital Signs:  ?BP 139/82   Pulse 88   Temp 98.2 ?F (36.8 ?C) (Oral)   Resp 16   Ht '5\' 8"'$  (1.727 m)   Wt 146 lb (66.2 kg)   SpO2 98%   BMI 22.20 kg/m?   ? ?Physical Exam:  ?Physical Exam ?Vitals reviewed.  ?Constitutional:   ?   Appearance: Normal appearance.  ?Skin: ?   Comments: Right axilla with improved swelling and ecchymosis, incision site open with minimal underlying hematoma  ?Neurological:  ?   Mental Status: She is alert.  ? ? ?Laboratory studies: None ? ?Imaging:  ?None ? ?Assessment:  ?61 y.o. yo Female who presents status post right axillary lymph node biopsy on 4/18 with subsequent development of hematoma and evacuation of hematoma on 5/9 ? ?Plan:  ?-She denies significant bloody drainage since evacuation of hematoma at last visit ?-I was able to evacuate a small residual amount of hematoma.  I then irrigated the wound with sterile saline.  I then packed the wound with damp Kerlix, covered with 4 x 4 and Medipore tape ?-Advised the patient to perform at least daily packing changes, but she may perform them more frequently if needed ?-Advised her to call the office if she begins having fevers, chills, or purulent drainage from her incision site ?-Follow up with me in 1 week ? ?All of the above recommendations were discussed with the patient and patient's family, and all of patient's and family's questions were answered to their expressed satisfaction. ? ?Graciella Freer, DO ?[facility] ?[place], [place]  [address] ?[phone] (office) ?

## 2022-02-10 ENCOUNTER — Telehealth: Payer: Self-pay | Admitting: Family Medicine

## 2022-02-10 NOTE — Telephone Encounter (Signed)
STD Paperwork filled out and faxed to [employer] at [phone]. Confirmation received. ? ? ? ?FMLA/ STD-  01/17/2022- 03/12/2022  ?Release from [employer] tentative 03/12/2022  ?Return to work tentative 03/13/2022  ?Further time off from Dr. Delton Coombes  ?

## 2022-02-14 ENCOUNTER — Inpatient Hospital Stay (HOSPITAL_COMMUNITY): Payer: 59 | Attending: Hematology | Admitting: Hematology

## 2022-02-14 VITALS — BP 118/66 | HR 112 | Temp 98.0°F | Resp 18 | Ht 68.0 in | Wt 143.1 lb

## 2022-02-14 DIAGNOSIS — D508 Other iron deficiency anemias: Secondary | ICD-10-CM

## 2022-02-14 DIAGNOSIS — N189 Chronic kidney disease, unspecified: Secondary | ICD-10-CM | POA: Diagnosis not present

## 2022-02-14 DIAGNOSIS — D631 Anemia in chronic kidney disease: Secondary | ICD-10-CM | POA: Insufficient documentation

## 2022-02-14 DIAGNOSIS — C801 Malignant (primary) neoplasm, unspecified: Secondary | ICD-10-CM | POA: Insufficient documentation

## 2022-02-14 DIAGNOSIS — D509 Iron deficiency anemia, unspecified: Secondary | ICD-10-CM | POA: Insufficient documentation

## 2022-02-14 DIAGNOSIS — R591 Generalized enlarged lymph nodes: Secondary | ICD-10-CM

## 2022-02-14 DIAGNOSIS — L928 Other granulomatous disorders of the skin and subcutaneous tissue: Secondary | ICD-10-CM | POA: Diagnosis not present

## 2022-02-14 DIAGNOSIS — D649 Anemia, unspecified: Secondary | ICD-10-CM | POA: Insufficient documentation

## 2022-02-14 NOTE — Progress Notes (Signed)
? ?[facility] ?[address] ?[place], [place] [address] ? ? ?CLINIC:  ?Medical Oncology/Hematology ? ?PCP:  ?Redmond School, MD ?[address] / [place] [address]  ?[phone] ? 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 ?PRIOR THERAPY: none ? ?CURRENT THERAPY: under work-up ? ?INTERVAL HISTORY:  ?Ms. Leah Padilla, a 61 y.o. female, returns for routine follow-up for her diffuse lymphadenopathy and hypercalcemia of malignancy. Leah Padilla was last seen on 01/23/2022. ? ?Today she reports feeling good. She denies fever and weight loss, and she reports hot flashes.  ? ?REVIEW OF SYSTEMS:  ?Review of Systems  ?Constitutional:  Positive for fatigue. Negative for appetite change, fever and unexpected weight change.  ?Gastrointestinal:  Positive for constipation and nausea.  ?Endocrine: Negative for hot flashes.  ?All other systems reviewed and are negative. ? ?PAST MEDICAL/SURGICAL HISTORY:  ?Past Medical History:  ?Diagnosis Date  ? Anxiety   ? Bell's palsy   ? Depression   ? High cholesterol   ? Hypertension   ? ?Past Surgical History:  ?Procedure Laterality Date  ? AXILLARY LYMPH NODE BIOPSY Right 01/17/2022  ? Procedure: AXILLARY LYMPH NODE BIOPSY;  Surgeon: Rusty Aus, DO;  Location: [facility];  Service: General;  Laterality: Right;  ? COLONOSCOPY N/A 04/17/2013  ? Procedure: COLONOSCOPY;  Surgeon: Rogene Houston, MD;  Location: [facility];  Service: Endoscopy;  Laterality: N/A;  930  ? CYST EXCISION    ? neck  ? ? ?SOCIAL HISTORY:  ?Social History  ? ?Socioeconomic History  ? Marital status: Divorced  ?  Spouse name: Not on file  ? Number of children: 2  ? Years of education: Not on file  ? Highest education level: Not on file  ?Occupational History  ? Not on file  ?Tobacco Use  ? Smoking status: Former  ?  Packs/day: 0.50  ?  Years: 13.00  ?  Pack years: 6.50  ?  Types: Cigarettes  ?  Quit date: 12/13/2016  ?  Years since quitting:  5.1  ? Smokeless tobacco: Never  ?Vaping Use  ? Vaping Use: Former  ?Substance and Sexual Activity  ? Alcohol use: Not Currently  ?  Comment: occasional  ? Drug use: No  ? Sexual activity: Yes  ?  Birth control/protection: Post-menopausal  ?Other Topics Concern  ? Not on file  ?Social History Narrative  ? Not on file  ? ?Social Determinants of Health  ? ?Financial Resource Strain: Not on file  ?Food Insecurity: Not on file  ?Transportation Needs: Not on file  ?Physical Activity: Not on file  ?Stress: Not on file  ?Social Connections: Not on file  ?Intimate Partner Violence: Not on file  ? ? ?FAMILY HISTORY:  ?Family History  ?Problem Relation Age of Onset  ? Stroke Other   ? Diabetes Other   ? Stroke Father   ? Breast cancer Mother   ? Breast cancer Sister   ? ? ?CURRENT MEDICATIONS:  ?Current Outpatient Medications  ?Medication Sig Dispense Refill  ? ALPRAZolam (XANAX) 1 MG tablet Take 1 mg by mouth 4 (four) times daily as needed for anxiety.     ? amLODipine (NORVASC) 10 MG tablet Take 1 tablet (10 mg total) by mouth daily. 30 tablet 0  ? HYDROcodone-acetaminophen (NORCO/VICODIN) 5-325 MG tablet Take 1 tablet by mouth every 4 (four) hours as needed for moderate pain. 20 tablet 0  ? imipramine (TOFRANIL) 25 MG tablet Take 25 mg by  mouth at bedtime.    ? metoprolol tartrate (LOPRESSOR) 25 MG tablet Take 1 tablet (25 mg total) by mouth 2 (two) times daily. 60 tablet 0  ? Multiple Vitamin (MULTIVITAMIN) tablet Take 1 tablet by mouth daily.    ? simvastatin (ZOCOR) 20 MG tablet Take 20 mg by mouth daily.    ? ?No current facility-administered medications for this visit.  ? ? ?ALLERGIES:  ?Allergies  ?Allergen Reactions  ? Doxycycline Nausea Only  ?  Other reaction(s): Other, Respiratory Distress ?Nausea, loss of appetite  ? Penicillins Hives and Nausea Only  ? Ciprofloxacin Other (See Comments)  ?  Dry mouth, skin tingling, panicky, stomach pain, anxious and nervous ?Other reaction(s): Other (See Comments) ?Dry mouth,  skin tingling, panicky, stomach pain, anxious and nervous  ? Estrace [Estradiol]   ? Polymox [Amoxicillin] Hives  ? Progesterone   ? ? ?PHYSICAL EXAM:  ?Performance status (ECOG): 1 - Symptomatic but completely ambulatory ? ?Vitals:  ? 02/14/22 0939  ?BP: 118/66  ?Pulse: (!) 112  ?Resp: 18  ?Temp: 98 ?F (36.7 ?C)  ?SpO2: 100%  ? ?Wt Readings from Last 3 Encounters:  ?02/14/22 143 lb 1.3 oz (64.9 kg)  ?02/09/22 146 lb (66.2 kg)  ?02/07/22 146 lb (66.2 kg)  ? ?Physical Exam ?Vitals reviewed.  ?Constitutional:   ?   Appearance: Normal appearance.  ?Cardiovascular:  ?   Rate and Rhythm: Normal rate and regular rhythm.  ?   Pulses: Normal pulses.  ?   Heart sounds: Normal heart sounds.  ?Pulmonary:  ?   Effort: Pulmonary effort is normal.  ?   Breath sounds: Normal breath sounds.  ?Neurological:  ?   General: No focal deficit present.  ?   Mental Status: She is alert and oriented to person, place, and time.  ?Psychiatric:     ?   Mood and Affect: Mood normal.     ?   Behavior: Behavior normal.  ? ? ?LABORATORY DATA:  ?I have reviewed the labs as listed.  ? ?  Latest Ref Rng & Units 01/23/2022  ? 12:39 PM 01/19/2022  ?  4:24 AM 01/18/2022  ?  1:29 PM  ?CBC  ?WBC 4.0 - 10.5 K/uL 10.9   11.4   12.0    ?Hemoglobin 12.0 - 15.0 g/dL 8.3   8.1   8.5    ?Hematocrit 36.0 - 46.0 % 25.1   25.8   26.0    ?Platelets 150 - 400 K/uL 511   427   409    ? ? ?  Latest Ref Rng & Units 01/23/2022  ? 12:39 PM 01/18/2022  ?  4:28 AM 01/17/2022  ?  5:08 AM  ?CMP  ?Glucose 70 - 99 mg/dL 120   107   95    ?BUN 6 - 20 mg/dL '13   13   14    '$ ?Creatinine 0.44 - 1.00 mg/dL 1.45   1.38   1.62    ?Sodium 135 - 145 mmol/L 136   136   140    ?Potassium 3.5 - 5.1 mmol/L 2.8   2.9   3.3    ?Chloride 98 - 111 mmol/L 108   109   112    ?CO2 22 - 32 mmol/L '21   22   23    '$ ?Calcium 8.9 - 10.3 mg/dL 9.3   9.1   9.9    ?Total Protein 6.5 - 8.1 g/dL 7.6      ?Total Bilirubin 0.3 -  1.2 mg/dL 0.6      ?Alkaline Phos 38 - 126 U/L 134      ?AST 15 - 41 U/L 30      ?ALT 0 -  44 U/L 26      ? ?   ?Component Value Date/Time  ? RBC 2.91 (L) 01/23/2022 1239  ? MCV 86.3 01/23/2022 1239  ? MCH 28.5 01/23/2022 1239  ? MCHC 33.1 01/23/2022 1239  ? RDW 16.8 (H) 01/23/2022 1239  ? LYMPHSABS 1.7 01/23/2022 1239  ? MONOABS 1.1 (H) 01/23/2022 1239  ? EOSABS 0.2 01/23/2022 1239  ? BASOSABS 0.1 01/23/2022 1239  ? ? ?DIAGNOSTIC IMAGING:  ?I have independently reviewed the scans and discussed with the patient. ?CT CHEST WO CONTRAST ? ?Result Date: 01/15/2022 ?CLINICAL DATA:  Chest wall pain and suspected infection. Worsening weakness and lethargy for several weeks. Acute renal failure. EXAM: CT CHEST, ABDOMEN AND PELVIS WITHOUT CONTRAST TECHNIQUE: Multidetector CT imaging of the chest, abdomen and pelvis was performed following the standard protocol without IV contrast. RADIATION DOSE REDUCTION: This exam was performed according to the departmental dose-optimization program which includes automated exposure control, adjustment of the mA and/or kV according to patient size and/or use of iterative reconstruction technique. COMPARISON:  [facility] only CT on 09/12/2015 FINDINGS: CT CHEST FINDINGS Cardiovascular: No acute findings. Mediastinum/Lymph Nodes: Mild bilateral hilar and subpectoral lymphadenopathy is seen bilaterally. Largest index lymph node in the right axilla measures 1.5 cm on image 38/2. Multiple sub-cm mediastinal lymph nodes are noted, none of which are pathologically enlarged. No definite hilar lymphadenopathy identified although mild hilar lymphadenopathy is difficult to exclude on this unenhanced exam. Lungs/Pleura: Mild bandlike opacities in both lower lobes may be due to mild scarring or atelectasis. No evidence of infiltrate, mass, or pleural effusion. Musculoskeletal:  No suspicious bone lesions identified. CT ABDOMEN AND PELVIS FINDINGS Hepatobiliary: No masses visualized on this unenhanced exam. Gallbladder is unremarkable. No evidence of biliary ductal dilatation. Pancreas: No mass or  inflammatory changes identified on this unenhanced exam. Spleen:  Within normal limits in size. Adrenals/Urinary Tract: 2 mm nonobstructing calculus noted in the upper pole of the left kidney. No evidence

## 2022-02-14 NOTE — Patient Instructions (Addendum)
[name] at [facility] ?Discharge Instructions ? ? ?You were seen and examined today by Dr. Delton Coombes. ? ?He reviewed the results of your PET scan.  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  ? ?We will also arrange for you to have IV iron infusions. You will come to the clinic 3 weeks in a row to receive 3 separate iron infusions.  ? ?Return as scheduled in 3 months to recheck your blood work.  ? ? ? ?Thank you for choosing [place] at [facility] to provide your oncology and hematology care.  To afford each patient quality time with our provider, please arrive at least 15 minutes before your scheduled appointment time.  ? ?If you have a lab appointment with the [facility] please come in thru the Main Entrance and check in at the main information desk. ? ?You need to re-schedule your appointment should you arrive 10 or more minutes late.  We strive to give you quality time with our providers, and arriving late affects you and other patients whose appointments are after yours.  Also, if you no show three or more times for appointments you may be dismissed from the clinic at the providers discretion.     ?Again, thank you for choosing [facility].  Our hope is that these requests will decrease the amount of time that you wait before being seen by our physicians.       ?_____________________________________________________________ ? ?Should you have questions after your visit to [facility], please contact our office at [phone] and follow the prompts.  Our office hours are 8:00 a.m. and 4:30 p.m. Monday - Friday.  Please note that voicemails left after 4:00 p.m. may not be returned until the following business day.  We are closed weekends and major holidays.  You do have access to a nurse 24-7, just call the main number to the clinic [phone] and do not press any options, hold on  the line and a nurse will answer the phone.   ? ?For prescription refill requests, have your pharmacy contact our office and allow 72 hours.   ? ?Due to Covid, you will need to wear a mask upon entering the hospital. If you do not have a mask, a mask will be given to you at the Main Entrance upon arrival. For doctor visits, patients may have 1 support person age 29 or older with them. For treatment visits, patients can not have anyone with them due to social distancing guidelines and our immunocompromised population.  ? ?   ?

## 2022-02-16 ENCOUNTER — Encounter: Payer: Self-pay | Admitting: Surgery

## 2022-02-16 ENCOUNTER — Ambulatory Visit (INDEPENDENT_AMBULATORY_CARE_PROVIDER_SITE_OTHER): Payer: 59 | Admitting: Surgery

## 2022-02-16 VITALS — BP 120/79 | HR 93 | Temp 98.6°F | Resp 14 | Ht 68.0 in | Wt 143.0 lb

## 2022-02-16 DIAGNOSIS — S40021D Contusion of right upper arm, subsequent encounter: Secondary | ICD-10-CM

## 2022-02-20 ENCOUNTER — Inpatient Hospital Stay (HOSPITAL_COMMUNITY): Payer: 59

## 2022-02-20 VITALS — BP 126/78 | HR 91 | Temp 98.9°F | Resp 18

## 2022-02-20 DIAGNOSIS — D509 Iron deficiency anemia, unspecified: Secondary | ICD-10-CM | POA: Diagnosis not present

## 2022-02-20 DIAGNOSIS — D508 Other iron deficiency anemias: Secondary | ICD-10-CM

## 2022-02-20 MED ORDER — FAMOTIDINE IN NACL 20-0.9 MG/50ML-% IV SOLN
20.0000 mg | Freq: Once | INTRAVENOUS | Status: AC
  Administered 2022-02-20: 20 mg via INTRAVENOUS
  Filled 2022-02-20: qty 50

## 2022-02-20 MED ORDER — SODIUM CHLORIDE 0.9 % IV SOLN
300.0000 mg | Freq: Once | INTRAVENOUS | Status: AC
  Administered 2022-02-20: 300 mg via INTRAVENOUS
  Filled 2022-02-20: qty 300

## 2022-02-20 MED ORDER — SODIUM CHLORIDE 0.9 % IV SOLN: Freq: Once | INTRAVENOUS | Status: AC

## 2022-02-20 MED ORDER — LORATADINE 10 MG PO TABS
10.0000 mg | ORAL_TABLET | Freq: Once | ORAL | Status: AC
  Administered 2022-02-20: 10 mg via ORAL
  Filled 2022-02-20: qty 1

## 2022-02-20 MED ORDER — METHYLPREDNISOLONE SODIUM SUCC 125 MG IJ SOLR
125.0000 mg | Freq: Once | INTRAMUSCULAR | Status: AC
  Administered 2022-02-20: 125 mg via INTRAVENOUS
  Filled 2022-02-20: qty 2

## 2022-02-20 MED ORDER — DIPHENHYDRAMINE HCL 50 MG/ML IJ SOLN
25.0000 mg | Freq: Once | INTRAMUSCULAR | Status: DC
  Filled 2022-02-20: qty 1

## 2022-02-20 NOTE — Progress Notes (Signed)
Pt presents today for Venofer IV iron infusion per provider's order. Vital signs stable and pt voiced no new complaints at this time.  Peripheral IV started with good blood return pre and post infusion.  Venofer 300 mg  given today per MD orders. Tolerated infusion without adverse affects. Vital signs stable. No complaints at this time. Discharged from clinic ambulatory in stable condition. Alert and oriented x 3. F/U with [facility] as scheduled.   

## 2022-02-20 NOTE — Progress Notes (Signed)
[facility]   HPI:  61 y.o. Female presents to clinic for post-op follow-up status post right axillary lymph node biopsy on 4/18 with subsequent development of hematoma.  She has been doing well since her last visit.  She has been doing daily dressing changes without difficulty.  She denies significant drainage from the incision site.  She denies fever and chills.  Review of Systems:  All other review of systems: otherwise negative   Vital Signs:  BP 120/79   Pulse 93   Temp 98.6 F (37 C) (Oral)   Resp 14   Ht '5\' 8"'$  (1.727 m)   Wt 143 lb (64.9 kg)   SpO2 94%   BMI 21.74 kg/m    Physical Exam:  Physical Exam Vitals reviewed.  Constitutional:      Appearance: Normal appearance.  Skin:    Comments: Right axilla with improved swelling and ecchymosis, incision site open with minimal underlying hematoma   Neurological:     Mental Status: She is alert.    Laboratory studies: None   Imaging:  None   Assessment:  61 y.o. yo Female who presents status post right axillary lymph node biopsy on 4/18 with subsequent development of hematoma and evacuation of hematoma on 5/9.  Plan:  -No significant drainage from site and wound size has significantly improved -I changed to the packing to the right axilla, no significant underlying hematoma noted.  I packed the wound with damp Kerlix, covered with a 4 x 4, and Medipore tape -Advised the patient to continue to perform at least daily packing changes -Advised her to call the office if she begins to have fever, chills, or purulent drainage -Will likely allow this area to heal by secondary intention -Follow up with me in 2 weeks  All of the above recommendations were discussed with the patient and patient's family, and all of patient's and family's questions were answered to their expressed satisfaction.  Graciella Freer, DO [facility] [address] [place], [place]  [phone] (office)

## 2022-02-20 NOTE — Patient Instructions (Signed)
[facility]  Discharge Instructions: Thank you for choosing [facility] to provide your oncology and hematology care.  If you have a lab appointment with the [facility], please come in thru the Main Entrance and check in at the main information desk.  Wear comfortable clothing and clothing appropriate for easy access to any Portacath or PICC line.   We strive to give you quality time with your provider. You may need to reschedule your appointment if you arrive late (15 or more minutes).  Arriving late affects you and other patients whose appointments are after yours.  Also, if you miss three or more appointments without notifying the office, you may be dismissed from the clinic at the provider's discretion.      For prescription refill requests, have your pharmacy contact our office and allow 72 hours for refills to be completed.    Today you received Venofer   BELOW ARE SYMPTOMS THAT SHOULD BE REPORTED IMMEDIATELY: *FEVER GREATER THAN 100.4 F (38 C) OR HIGHER *CHILLS OR SWEATING *NAUSEA AND VOMITING THAT IS NOT CONTROLLED WITH YOUR NAUSEA MEDICATION *UNUSUAL SHORTNESS OF BREATH *UNUSUAL BRUISING OR BLEEDING *URINARY PROBLEMS (pain or burning when urinating, or frequent urination) *BOWEL PROBLEMS (unusual diarrhea, constipation, pain near the anus) TENDERNESS IN MOUTH AND THROAT WITH OR WITHOUT PRESENCE OF ULCERS (sore throat, sores in mouth, or a toothache) UNUSUAL RASH, SWELLING OR PAIN  UNUSUAL VAGINAL DISCHARGE OR ITCHING   Items with * indicate a potential emergency and should be followed up as soon as possible or go to the Emergency Department if any problems should occur.  Please show the CHEMOTHERAPY ALERT CARD or IMMUNOTHERAPY ALERT CARD at check-in to the Emergency Department and triage nurse.  Should you have questions after your visit or need to cancel or reschedule your appointment, please contact [facility] [phone]  and  follow the prompts.  Office hours are 8:00 a.m. to 4:30 p.m. Monday - Friday. Please note that voicemails left after 4:00 p.m. may not be returned until the following business day.  We are closed weekends and major holidays. You have access to a nurse at all times for urgent questions. Please call the main number to the clinic [phone] and follow the prompts.  For any non-urgent questions, you may also contact your provider using MyChart. We now offer e-Visits for anyone 70 and older to request care online for non-urgent symptoms. For details visit [email or web address].   Also download the MyChart app! Go to the app store, search "MyChart", open the app, select [facility], and log in with your MyChart username and password.  Due to Covid, a mask is required upon entering the hospital/clinic. If you do not have a mask, one will be given to you upon arrival. For doctor visits, patients may have 1 support person aged 29 or older with them. For treatment visits, patients cannot have anyone with them due to current Covid guidelines and our immunocompromised population.

## 2022-03-01 ENCOUNTER — Encounter: Payer: Self-pay | Admitting: Surgery

## 2022-03-01 ENCOUNTER — Inpatient Hospital Stay (HOSPITAL_COMMUNITY): Payer: 59

## 2022-03-01 ENCOUNTER — Encounter (HOSPITAL_COMMUNITY): Payer: Self-pay

## 2022-03-01 ENCOUNTER — Ambulatory Visit (INDEPENDENT_AMBULATORY_CARE_PROVIDER_SITE_OTHER): Payer: 59 | Admitting: Surgery

## 2022-03-01 VITALS — BP 121/84 | HR 97 | Temp 98.2°F | Resp 12 | Ht 68.0 in | Wt 144.0 lb

## 2022-03-01 VITALS — BP 119/65 | HR 89 | Temp 98.0°F | Resp 18

## 2022-03-01 DIAGNOSIS — D509 Iron deficiency anemia, unspecified: Secondary | ICD-10-CM | POA: Diagnosis not present

## 2022-03-01 DIAGNOSIS — S40021D Contusion of right upper arm, subsequent encounter: Secondary | ICD-10-CM

## 2022-03-01 DIAGNOSIS — D508 Other iron deficiency anemias: Secondary | ICD-10-CM

## 2022-03-01 DIAGNOSIS — Z09 Encounter for follow-up examination after completed treatment for conditions other than malignant neoplasm: Secondary | ICD-10-CM

## 2022-03-01 MED ORDER — FAMOTIDINE IN NACL 20-0.9 MG/50ML-% IV SOLN
20.0000 mg | Freq: Once | INTRAVENOUS | Status: AC
  Administered 2022-03-01: 20 mg via INTRAVENOUS
  Filled 2022-03-01: qty 50

## 2022-03-01 MED ORDER — METHYLPREDNISOLONE SODIUM SUCC 125 MG IJ SOLR
125.0000 mg | Freq: Once | INTRAMUSCULAR | Status: AC
  Administered 2022-03-01: 125 mg via INTRAVENOUS
  Filled 2022-03-01: qty 2

## 2022-03-01 MED ORDER — LORATADINE 10 MG PO TABS
10.0000 mg | ORAL_TABLET | Freq: Once | ORAL | Status: AC
  Administered 2022-03-01: 10 mg via ORAL
  Filled 2022-03-01: qty 1

## 2022-03-01 MED ORDER — SODIUM CHLORIDE 0.9 % IV SOLN: Freq: Once | INTRAVENOUS | Status: AC

## 2022-03-01 MED ORDER — SODIUM CHLORIDE 0.9 % IV SOLN
300.0000 mg | Freq: Once | INTRAVENOUS | Status: AC
  Administered 2022-03-01: 300 mg via INTRAVENOUS
  Filled 2022-03-01: qty 5

## 2022-03-01 NOTE — Progress Notes (Signed)
Patient tolerated iron infusion with no complaints voiced.  Peripheral IV site clean and dry with good blood return noted before and after infusion.  Band aid applied.  VSS with discharge and left in satisfactory condition with no s/s of distress noted.   

## 2022-03-01 NOTE — Patient Instructions (Signed)
[facility]  Discharge Instructions: Thank you for choosing [facility] to provide your oncology and hematology care.  If you have a lab appointment with the Cancer Center, please come in thru the Main Entrance and check in at the main information desk.  Wear comfortable clothing and clothing appropriate for easy access to any Portacath or PICC line.   We strive to give you quality time with your provider. You may need to reschedule your appointment if you arrive late (15 or more minutes).  Arriving late affects you and other patients whose appointments are after yours.  Also, if you miss three or more appointments without notifying the office, you may be dismissed from the clinic at the provider's discretion.      For prescription refill requests, have your pharmacy contact our office and allow 72 hours for refills to be completed.    Today you received the following: Venofer, return as scheduled.   To help prevent nausea and vomiting after your treatment, we encourage you to take your nausea medication as directed.  BELOW ARE SYMPTOMS THAT SHOULD BE REPORTED IMMEDIATELY: *FEVER GREATER THAN 100.4 F (38 C) OR HIGHER *CHILLS OR SWEATING *NAUSEA AND VOMITING THAT IS NOT CONTROLLED WITH YOUR NAUSEA MEDICATION *UNUSUAL SHORTNESS OF BREATH *UNUSUAL BRUISING OR BLEEDING *URINARY PROBLEMS (pain or burning when urinating, or frequent urination) *BOWEL PROBLEMS (unusual diarrhea, constipation, pain near the anus) TENDERNESS IN MOUTH AND THROAT WITH OR WITHOUT PRESENCE OF ULCERS (sore throat, sores in mouth, or a toothache) UNUSUAL RASH, SWELLING OR PAIN  UNUSUAL VAGINAL DISCHARGE OR ITCHING   Items with * indicate a potential emergency and should be followed up as soon as possible or go to the Emergency Department if any problems should occur.  Please show the CHEMOTHERAPY ALERT CARD or IMMUNOTHERAPY ALERT CARD at check-in to the Emergency Department and triage  nurse.  Should you have questions after your visit or need to cancel or reschedule your appointment, please contact [facility] [phone]  and follow the prompts.  Office hours are 8:00 a.m. to 4:30 p.m. Monday - Friday. Please note that voicemails left after 4:00 p.m. may not be returned until the following business day.  We are closed weekends and major holidays. You have access to a nurse at all times for urgent questions. Please call the main number to the clinic [phone] and follow the prompts.  For any non-urgent questions, you may also contact your provider using MyChart. We now offer e-Visits for anyone 18 and older to request care online for non-urgent symptoms. For details visit [email or web address].   Also download the MyChart app! Go to the app store, search "MyChart", open the app, select [facility], and log in with your MyChart username and password.  Due to Covid, a mask is required upon entering the hospital/clinic. If you do not have a mask, one will be given to you upon arrival. For doctor visits, patients may have 1 support person aged 18 or older with them. For treatment visits, patients cannot have anyone with them due to current Covid guidelines and our immunocompromised population.  

## 2022-03-01 NOTE — Progress Notes (Signed)
[facility] Note   HPI:  61 y.o. Female presents to clinic for post-op follow-up status post right axillary lymph node biopsy on 4/18 with subsequent development of hematoma.  She has been doing well, and her wound has been decreasing in size.  She denies significant drainage from the incision site.  She denies purulent drainage, fevers, and chills.  Review of Systems:  All other review of systems: otherwise negative   Vital Signs:  BP 121/84   Pulse 97   Temp 98.2 F (36.8 C) (Oral)   Resp 12   Ht '5\' 8"'$  (1.727 m)   Wt 144 lb (65.3 kg)   SpO2 98%   BMI 21.90 kg/m    Physical Exam:  Physical Exam Vitals reviewed.  Constitutional:      Appearance: Normal appearance.  Skin:    Comments: Right axillary incision site open, decreasing size (3 cm opening), no underlying hematoma, pink granulation tissue at the base  Neurological:     Mental Status: She is alert.   Laboratory studies: None  Imaging:  None  Assessment:  61 y.o. yo Female who presents status post right axillary lymph node biopsy on 4/18 with subsequent development of hematoma and evacuation of hematoma on 5/9.  Plan:  -Minimal drainage from her wound with decreasing size of opening -I changed the packing to the right axilla, and irrigated the wound.  I packed the wound with 1 inch gauze packing, and covered with a large Band-Aid -Advised the patient to continue to perform daily packing changes, may now use 1 inch gauze packing.  If the opening continues to close, may cut the packing in half lengthwise to allow for easier packing -Call the office if patient begins to have purulent drainage, fevers, or chills -Follow up in 3 weeks  All of the above recommendations were discussed with the patient and patient's family, and all of patient's and family's questions were answered to their expressed satisfaction.  Graciella Freer, DO [facility] [address] [place], [place] [address] [phone] (office)

## 2022-03-02 NOTE — Telephone Encounter (Signed)
[employer] was sent online an update on the extension of her claim and received confirmation that they have received it and will email patient with decision.

## 2022-03-02 NOTE — Telephone Encounter (Signed)
Received call from patient.   States that she would like to extend leave due to hematoma S/P axillary lymph node bx.   Discussed with Dr. Okey Dupre who agreed to extend leave until 04/16/2022 with return to work date of 04/17/2022.   Routed to [place] [facility].

## 2022-03-03 NOTE — Telephone Encounter (Signed)
Paperwork filled out with extended time date and faxed to Sharyn Lull with [employer] at [phone] as requested by fax received. Patient has extended out of work time until 04/17/2022. Confirmation Received.

## 2022-03-10 ENCOUNTER — Inpatient Hospital Stay (HOSPITAL_COMMUNITY): Payer: 59 | Attending: Hematology

## 2022-03-10 VITALS — BP 120/69 | HR 86 | Temp 98.6°F | Resp 18

## 2022-03-10 DIAGNOSIS — D509 Iron deficiency anemia, unspecified: Secondary | ICD-10-CM | POA: Insufficient documentation

## 2022-03-10 DIAGNOSIS — N189 Chronic kidney disease, unspecified: Secondary | ICD-10-CM | POA: Insufficient documentation

## 2022-03-10 DIAGNOSIS — D508 Other iron deficiency anemias: Secondary | ICD-10-CM

## 2022-03-10 MED ORDER — SODIUM CHLORIDE 0.9 % IV SOLN
300.0000 mg | Freq: Once | INTRAVENOUS | Status: AC
  Administered 2022-03-10: 300 mg via INTRAVENOUS
  Filled 2022-03-10: qty 300

## 2022-03-10 MED ORDER — LORATADINE 10 MG PO TABS
10.0000 mg | ORAL_TABLET | Freq: Once | ORAL | Status: DC
  Filled 2022-03-10: qty 1

## 2022-03-10 MED ORDER — LORATADINE 10 MG PO TABS
10.0000 mg | ORAL_TABLET | Freq: Once | ORAL | Status: AC
  Administered 2022-03-10: 10 mg via ORAL
  Filled 2022-03-10: qty 1

## 2022-03-10 MED ORDER — METHYLPREDNISOLONE SODIUM SUCC 125 MG IJ SOLR
125.0000 mg | Freq: Once | INTRAMUSCULAR | Status: AC
  Administered 2022-03-10: 125 mg via INTRAVENOUS
  Filled 2022-03-10: qty 2

## 2022-03-10 MED ORDER — FAMOTIDINE IN NACL 20-0.9 MG/50ML-% IV SOLN
20.0000 mg | Freq: Once | INTRAVENOUS | Status: AC
  Administered 2022-03-10: 20 mg via INTRAVENOUS
  Filled 2022-03-10: qty 50

## 2022-03-10 MED ORDER — SODIUM CHLORIDE 0.9 % IV SOLN: Freq: Once | INTRAVENOUS | Status: AC

## 2022-03-10 NOTE — Patient Instructions (Signed)
[facility]  Discharge Instructions: Thank you for choosing [facility] to provide your oncology and hematology care.  If you have a lab appointment with the Cancer Center, please come in thru the Main Entrance and check in at the main information desk.  Wear comfortable clothing and clothing appropriate for easy access to any Portacath or PICC line.   We strive to give you quality time with your provider. You may need to reschedule your appointment if you arrive late (15 or more minutes).  Arriving late affects you and other patients whose appointments are after yours.  Also, if you miss three or more appointments without notifying the office, you may be dismissed from the clinic at the provider's discretion.      For prescription refill requests, have your pharmacy contact our office and allow 72 hours for refills to be completed.    Today you received the following chemotherapy and/or immunotherapy agents Venofer      To help prevent nausea and vomiting after your treatment, we encourage you to take your nausea medication as directed.  BELOW ARE SYMPTOMS THAT SHOULD BE REPORTED IMMEDIATELY: *FEVER GREATER THAN 100.4 F (38 C) OR HIGHER *CHILLS OR SWEATING *NAUSEA AND VOMITING THAT IS NOT CONTROLLED WITH YOUR NAUSEA MEDICATION *UNUSUAL SHORTNESS OF BREATH *UNUSUAL BRUISING OR BLEEDING *URINARY PROBLEMS (pain or burning when urinating, or frequent urination) *BOWEL PROBLEMS (unusual diarrhea, constipation, pain near the anus) TENDERNESS IN MOUTH AND THROAT WITH OR WITHOUT PRESENCE OF ULCERS (sore throat, sores in mouth, or a toothache) UNUSUAL RASH, SWELLING OR PAIN  UNUSUAL VAGINAL DISCHARGE OR ITCHING   Items with * indicate a potential emergency and should be followed up as soon as possible or go to the Emergency Department if any problems should occur.  Please show the CHEMOTHERAPY ALERT CARD or IMMUNOTHERAPY ALERT CARD at check-in to the Emergency  Department and triage nurse.  Should you have questions after your visit or need to cancel or reschedule your appointment, please contact [facility] [phone]  and follow the prompts.  Office hours are 8:00 a.m. to 4:30 p.m. Monday - Friday. Please note that voicemails left after 4:00 p.m. may not be returned until the following business day.  We are closed weekends and major holidays. You have access to a nurse at all times for urgent questions. Please call the main number to the clinic [phone] and follow the prompts.  For any non-urgent questions, you may also contact your provider using MyChart. We now offer e-Visits for anyone 18 and older to request care online for non-urgent symptoms. For details visit [email or web address].   Also download the MyChart app! Go to the app store, search "MyChart", open the app, select [facility], and log in with your MyChart username and password.  Due to Covid, a mask is required upon entering the hospital/clinic. If you do not have a mask, one will be given to you upon arrival. For doctor visits, patients may have 1 support person aged 18 or older with them. For treatment visits, patients cannot have anyone with them due to current Covid guidelines and our immunocompromised population.  

## 2022-03-10 NOTE — Progress Notes (Signed)
Patient presents today for Venofer infusion per providers order.  Vital signs WNL.  Patient has no new complaints at this time.  Peripheral IV started and blood return noted pre and post infusion.  Venofer given today per MD orders.  Stable during infusion without adverse affects.  Vital signs stable.  No complaints at this time.  Discharge from clinic ambulatory in stable condition.  Alert and oriented X 3.  Follow up with [facility] as scheduled.  

## 2022-03-13 ENCOUNTER — Telehealth (HOSPITAL_COMMUNITY): Payer: Self-pay | Admitting: *Deleted

## 2022-03-13 NOTE — Telephone Encounter (Signed)
Received call from patient today stating that she began experiencing, what she described as a small patch of sunburn and hot feelings and abdominal discomfort, following Venofer infusion on Friday 6/9.  Symptoms started on Saturday.  She was also diagnosed with a UTI, for which she was treated with antibiotics. Advised that symptoms were more likely from antibiotic in combination with the UTI.  Made her aware to consult her PCP if further symptoms occur.  States she is feeling better this am.

## 2022-03-22 ENCOUNTER — Encounter: Payer: Self-pay | Admitting: Surgery

## 2022-03-22 ENCOUNTER — Ambulatory Visit (INDEPENDENT_AMBULATORY_CARE_PROVIDER_SITE_OTHER): Payer: 59 | Admitting: Surgery

## 2022-03-22 VITALS — BP 126/84 | HR 94 | Temp 97.3°F | Resp 16 | Ht 68.0 in | Wt 145.0 lb

## 2022-03-22 DIAGNOSIS — Z09 Encounter for follow-up examination after completed treatment for conditions other than malignant neoplasm: Secondary | ICD-10-CM

## 2022-03-22 NOTE — Progress Notes (Signed)
[facility] Note   HPI:  61 y.o. Female presents to clinic for post-op follow-up status post right axillary lymph node biopsy on 4/18 with subsequent development of hematoma.  She has been doing very well, and states that the area is now closed.  She denies any drainage further from the area.  She denies any fevers or chills.  Review of Systems:  All other review of systems: otherwise negative   Vital Signs:  BP 126/84   Pulse 94   Temp (!) 97.3 F (36.3 C) (Oral)   Resp 16   Ht '5\' 8"'$  (1.727 m)   Wt 145 lb (65.8 kg)   SpO2 97%   BMI 22.05 kg/m    Physical Exam:  Physical Exam Vitals reviewed.  Constitutional:      Appearance: Normal appearance.  Skin:    Comments: Right axilla wound completely closed with small scab at the middle of the healed incision, no surrounding erythema or induration  Neurological:     Mental Status: She is alert.    Laboratory studies: None  Imaging:  None  Assessment:  61 y.o. yo Female who presents status post right axillary lymph node biopsy on 4/18 with subsequent development of hematoma and evacuation of hematoma on 5/9  Plan:  -Wound has completely closed with only a small scab at the middle part of the healed incision -Advised the patient that she is okay to return from work from a surgical standpoint -Note provided to the patient stating that she may return to work next week and should only return to work for 9 hours/day instead of her previously scheduled 12 hours/day -Follow up as needed  All of the above recommendations were discussed with the patient, and all of patient's questions were answered to her expressed satisfaction.  Graciella Freer, DO [facility] [address] [place], [place] [address] [phone] (office)

## 2022-03-23 ENCOUNTER — Other Ambulatory Visit (HOSPITAL_COMMUNITY): Payer: Self-pay | Admitting: Hematology

## 2022-03-23 ENCOUNTER — Encounter: Payer: Self-pay | Admitting: *Deleted

## 2022-03-23 ENCOUNTER — Inpatient Hospital Stay (HOSPITAL_COMMUNITY): Payer: 59

## 2022-03-23 ENCOUNTER — Other Ambulatory Visit (HOSPITAL_COMMUNITY): Payer: Self-pay | Admitting: *Deleted

## 2022-03-23 ENCOUNTER — Telehealth (HOSPITAL_COMMUNITY): Payer: Self-pay | Admitting: *Deleted

## 2022-03-23 DIAGNOSIS — Z87440 Personal history of urinary (tract) infections: Secondary | ICD-10-CM

## 2022-03-23 DIAGNOSIS — D509 Iron deficiency anemia, unspecified: Secondary | ICD-10-CM | POA: Diagnosis not present

## 2022-03-23 DIAGNOSIS — R300 Dysuria: Secondary | ICD-10-CM

## 2022-03-23 LAB — COMPREHENSIVE METABOLIC PANEL
ALT: 38 U/L (ref 0–44)
AST: 52 U/L — ABNORMAL HIGH (ref 15–41)
Albumin: 3.3 g/dL — ABNORMAL LOW (ref 3.5–5.0)
Alkaline Phosphatase: 152 U/L — ABNORMAL HIGH (ref 38–126)
Anion gap: 5 (ref 5–15)
BUN: 14 mg/dL (ref 6–20)
CO2: 25 mmol/L (ref 22–32)
Calcium: 8.6 mg/dL — ABNORMAL LOW (ref 8.9–10.3)
Chloride: 101 mmol/L (ref 98–111)
Creatinine, Ser: 1.26 mg/dL — ABNORMAL HIGH (ref 0.44–1.00)
GFR, Estimated: 49 mL/min — ABNORMAL LOW (ref >60)
Glucose, Bld: 108 mg/dL — ABNORMAL HIGH (ref 70–99)
Potassium: 3.6 mmol/L (ref 3.5–5.1)
Sodium: 131 mmol/L — ABNORMAL LOW (ref 135–145)
Total Bilirubin: 0.3 mg/dL (ref 0.3–1.2)
Total Protein: 7.3 g/dL (ref 6.5–8.1)

## 2022-03-23 LAB — CBC
HCT: 40.2 % (ref 36.0–46.0)
Hemoglobin: 13.2 g/dL (ref 12.0–15.0)
MCH: 28.7 pg (ref 26.0–34.0)
MCHC: 32.8 g/dL (ref 30.0–36.0)
MCV: 87.4 fL (ref 80.0–100.0)
Platelets: 320 10*3/uL (ref 150–400)
RBC: 4.6 MIL/uL (ref 3.87–5.11)
RDW: 13.5 % (ref 11.5–15.5)
WBC: 7.8 10*3/uL (ref 4.0–10.5)
nRBC: 0 % (ref 0.0–0.2)

## 2022-03-23 LAB — URINALYSIS, ROUTINE W REFLEX MICROSCOPIC
Bilirubin Urine: NEGATIVE
Glucose, UA: NEGATIVE mg/dL
Ketones, ur: NEGATIVE mg/dL
Nitrite: POSITIVE — AB
Protein, ur: 30 mg/dL — AB
Specific Gravity, Urine: 1.012 (ref 1.005–1.030)
WBC, UA: >50 WBC/hpf — ABNORMAL HIGH (ref 0–5)
pH: 6 (ref 5.0–8.0)

## 2022-03-23 MED ORDER — CIPROFLOXACIN HCL 500 MG PO TABS: 500.0000 mg | ORAL_TABLET | Freq: Two times a day (BID) | ORAL | 0 refills | Status: DC

## 2022-03-23 NOTE — Telephone Encounter (Signed)
Lab results are back.  Would you like for me to send in an antibiotic for UTI?

## 2022-03-23 NOTE — Telephone Encounter (Signed)
Received call from patient c/o urgency, burning with urination, chills and generalized malaise.  She has significant hx of redcurrant UTI.  Was unable to get an appointment with PCP.  Will bring her in this morning for labs and treat accordingly.

## 2022-03-24 ENCOUNTER — Encounter (HOSPITAL_COMMUNITY): Payer: Self-pay | Admitting: Hematology

## 2022-03-27 ENCOUNTER — Other Ambulatory Visit (HOSPITAL_COMMUNITY): Payer: Self-pay

## 2022-03-27 ENCOUNTER — Telehealth: Payer: Self-pay | Admitting: *Deleted

## 2022-03-27 DIAGNOSIS — Z87440 Personal history of urinary (tract) infections: Secondary | ICD-10-CM

## 2022-03-27 DIAGNOSIS — N390 Urinary tract infection, site not specified: Secondary | ICD-10-CM

## 2022-03-27 MED ORDER — SULFAMETHOXAZOLE-TRIMETHOPRIM 800-160 MG PO TABS: 1.0000 | ORAL_TABLET | Freq: Two times a day (BID) | ORAL | 0 refills | Status: DC

## 2022-04-10 ENCOUNTER — Telehealth: Payer: Self-pay | Admitting: *Deleted

## 2022-04-10 NOTE — Telephone Encounter (Signed)
Received call from patient [phone]~ telephone.   Surgical Date: 01/17/2022 Procedure: Axillary Lymph Node Biopsy  Patient reports that she continues to have increased fatigue though she is working less hours this week. Requested to extend short shifts x1 additional week.   Please advise.

## 2022-04-11 NOTE — Telephone Encounter (Signed)
Call placed to patient and patient made aware. Verbalized understanding.  

## 2022-04-11 NOTE — Telephone Encounter (Signed)
Discussed case with Dr. Okey Dupre.   Reports that from general surgery standpoint, patient should be recovered and able to be released as per last note. Advised that any further weakness or fatigue should not be caused by axillary lymph node biopsy from April 2023.   Recommended that patient contact Dr. Delton Coombes moving forward for any further time off requests in regards to lymphadenopathy.  Call placed to patient. [place].

## 2022-04-14 ENCOUNTER — Other Ambulatory Visit (INDEPENDENT_AMBULATORY_CARE_PROVIDER_SITE_OTHER): Payer: 59

## 2022-04-14 DIAGNOSIS — Z87440 Personal history of urinary (tract) infections: Secondary | ICD-10-CM | POA: Diagnosis not present

## 2022-04-14 DIAGNOSIS — R809 Proteinuria, unspecified: Secondary | ICD-10-CM

## 2022-04-14 LAB — POCT URINALYSIS DIPSTICK
Blood, UA: NEGATIVE
Glucose, UA: NEGATIVE
Ketones, UA: NEGATIVE
Nitrite, UA: NEGATIVE
Protein, UA: POSITIVE — AB

## 2022-04-14 NOTE — Progress Notes (Signed)
   NURSE VISIT- UTI SYMPTOMS   SUBJECTIVE:  Leah Padilla is a 61 y.o. [id number] female here for UTI symptoms. She is a GYN patient. She reports  no symptoms, however the states she normally does not have symptoms until it is really bad .  OBJECTIVE:  There were no vitals taken for this visit.  Appears well, in no apparent distress  Results for orders placed or performed in visit on 04/14/22 (from the past 24 hour(s))  POCT Urinalysis Dipstick   Collection Time: 04/14/22  9:39 AM  Result Value Ref Range   Color, UA     Clarity, UA     Glucose, UA Negative Negative   Bilirubin, UA     Ketones, UA neg    Spec Grav, UA     Blood, UA neg    pH, UA     Protein, UA Positive (A) Negative   Urobilinogen, UA     Nitrite, UA neg    Leukocytes, UA Trace (A) Negative   Appearance     Odor      ASSESSMENT: GYN Patient requesting urine culture and negative nitrites  PLAN: Note routed to Derrek Monaco, AGNP   Rx sent by provider today: No Urine culture sent Call or return to clinic prn if these symptoms worsen or fail to improve as anticipated. Follow-up: as needed   Leah Padilla  04/14/2022 9:41 AM

## 2022-04-15 LAB — URINALYSIS, ROUTINE W REFLEX MICROSCOPIC
Bilirubin, UA: NEGATIVE
Glucose, UA: NEGATIVE
Ketones, UA: NEGATIVE
Nitrite, UA: POSITIVE — AB
Specific Gravity, UA: 1.012 (ref 1.005–1.030)
Urobilinogen, Ur: 0.2 mg/dL (ref 0.2–1.0)
pH, UA: 6.5 (ref 5.0–7.5)

## 2022-04-15 LAB — MICROSCOPIC EXAMINATION
Casts: NONE SEEN /lpf
WBC, UA: >30 /hpf — AB (ref 0–5)

## 2022-04-18 ENCOUNTER — Telehealth: Payer: Self-pay | Admitting: *Deleted

## 2022-04-18 NOTE — Telephone Encounter (Signed)
Pt states she needs a call back concerning her lab results.

## 2022-04-19 ENCOUNTER — Telehealth: Payer: Self-pay | Admitting: *Deleted

## 2022-04-19 ENCOUNTER — Other Ambulatory Visit: Payer: Self-pay | Admitting: Adult Health

## 2022-04-19 LAB — URINE CULTURE

## 2022-04-19 MED ORDER — SULFAMETHOXAZOLE-TRIMETHOPRIM 800-160 MG PO TABS: 1.0000 | ORAL_TABLET | Freq: Two times a day (BID) | ORAL | 0 refills | Status: DC

## 2022-04-19 NOTE — Telephone Encounter (Signed)
-----   Message from Leah Dooms, NP sent at 04/19/2022  9:29 AM EDT ----- Make sure she knows I have sent in rx for septra ds for +UTI THX

## 2022-04-19 NOTE — Telephone Encounter (Signed)
Pt aware urine culture was + for UTI and med was sent to pharmacy. Finish med and push lots of fluids. Pt voiced understanding. [place]

## 2022-04-19 NOTE — Progress Notes (Signed)
+  UTI will rx septra ds

## 2022-05-15 ENCOUNTER — Other Ambulatory Visit: Payer: 59

## 2022-05-17 ENCOUNTER — Other Ambulatory Visit: Payer: Self-pay

## 2022-05-17 ENCOUNTER — Encounter: Payer: Self-pay | Admitting: Family Medicine

## 2022-05-17 ENCOUNTER — Inpatient Hospital Stay: Payer: 59 | Attending: Hematology

## 2022-05-17 ENCOUNTER — Ambulatory Visit (INDEPENDENT_AMBULATORY_CARE_PROVIDER_SITE_OTHER): Payer: 59 | Admitting: Family Medicine

## 2022-05-17 VITALS — BP 121/83 | HR 87 | Ht 68.5 in | Wt 135.0 lb

## 2022-05-17 DIAGNOSIS — K5900 Constipation, unspecified: Secondary | ICD-10-CM

## 2022-05-17 DIAGNOSIS — R110 Nausea: Secondary | ICD-10-CM

## 2022-05-17 DIAGNOSIS — Z1211 Encounter for screening for malignant neoplasm of colon: Secondary | ICD-10-CM | POA: Diagnosis not present

## 2022-05-17 DIAGNOSIS — D508 Other iron deficiency anemias: Secondary | ICD-10-CM

## 2022-05-17 DIAGNOSIS — G510 Bell's palsy: Secondary | ICD-10-CM

## 2022-05-17 DIAGNOSIS — C801 Malignant (primary) neoplasm, unspecified: Secondary | ICD-10-CM | POA: Insufficient documentation

## 2022-05-17 DIAGNOSIS — I10 Essential (primary) hypertension: Secondary | ICD-10-CM

## 2022-05-17 DIAGNOSIS — R591 Generalized enlarged lymph nodes: Secondary | ICD-10-CM

## 2022-05-17 DIAGNOSIS — D649 Anemia, unspecified: Secondary | ICD-10-CM | POA: Insufficient documentation

## 2022-05-17 LAB — FERRITIN: Ferritin: 350 ng/mL — ABNORMAL HIGH (ref 11–307)

## 2022-05-17 LAB — IRON AND TIBC
Iron: 71 ug/dL (ref 28–170)
Saturation Ratios: 32 % — ABNORMAL HIGH (ref 10.4–31.8)
TIBC: 222 ug/dL — ABNORMAL LOW (ref 250–450)
UIBC: 151 ug/dL

## 2022-05-17 LAB — CBC WITH DIFFERENTIAL/PLATELET
Abs Immature Granulocytes: 0.04 10*3/uL (ref 0.00–0.07)
Basophils Absolute: 0.1 10*3/uL (ref 0.0–0.1)
Basophils Relative: 1 %
Eosinophils Absolute: 0.1 10*3/uL (ref 0.0–0.5)
Eosinophils Relative: 1 %
HCT: 44.7 % (ref 36.0–46.0)
Hemoglobin: 14.5 g/dL (ref 12.0–15.0)
Immature Granulocytes: 0 %
Lymphocytes Relative: 14 %
Lymphs Abs: 1.3 10*3/uL (ref 0.7–4.0)
MCH: 27.5 pg (ref 26.0–34.0)
MCHC: 32.4 g/dL (ref 30.0–36.0)
MCV: 84.7 fL (ref 80.0–100.0)
Monocytes Absolute: 1 10*3/uL (ref 0.1–1.0)
Monocytes Relative: 11 %
Neutro Abs: 6.5 10*3/uL (ref 1.7–7.7)
Neutrophils Relative %: 73 %
Platelets: 427 10*3/uL — ABNORMAL HIGH (ref 150–400)
RBC: 5.28 MIL/uL — ABNORMAL HIGH (ref 3.87–5.11)
RDW: 14 % (ref 11.5–15.5)
WBC: 9 10*3/uL (ref 4.0–10.5)
nRBC: 0 % (ref 0.0–0.2)

## 2022-05-17 MED ORDER — ONDANSETRON HCL 4 MG PO TABS: 4.0000 mg | ORAL_TABLET | Freq: Three times a day (TID) | ORAL | 0 refills | Status: DC | PRN

## 2022-05-17 NOTE — Progress Notes (Signed)
W

## 2022-05-17 NOTE — Assessment & Plan Note (Addendum)
Controlled  She was informed that her antihypertensive can cause fatigue, but it is unlikely to cause weight loss, given she's not on any diuretic

## 2022-05-17 NOTE — Progress Notes (Signed)
New Patient Office Visit  Subjective:  Patient ID: Leah Padilla, female    DOB: 1961-04-26  Age: 61 y.o. MRN: [id number]  CC:  Chief Complaint  Patient presents with   New Patient (Initial Visit)    Pt establishing care, was recently diagnosed in march with bells palsy, has been having sx of losing weight and extreme fatigue. Has concerns about bp medication causing her to have side effects.     HPI Leah Padilla is a 61 y.o. female with past medical history of Essential Hypertension presents for establishing care. Bell's Palsy: seen and treated in the ED on 12/02/21 with prednisone. No reported symptoms day.  Lymphadenopathy: following up with oncology. C/o of increased fatigue  and wt loss Constipations: reports having hard BM every 2 days. Denis hematochezia.   Past Medical History:  Diagnosis Date   Anxiety    Bell's palsy    Depression    High cholesterol    Hypertension     Past Surgical History:  Procedure Laterality Date   AXILLARY LYMPH NODE BIOPSY Right 01/17/2022   Procedure: AXILLARY LYMPH NODE BIOPSY;  Surgeon: Rusty Aus, DO;  Location: [facility];  Service: General;  Laterality: Right;   COLONOSCOPY N/A 04/17/2013   Procedure: COLONOSCOPY;  Surgeon: Rogene Houston, MD;  Location: [facility];  Service: Endoscopy;  Laterality: N/A;  930   CYST EXCISION     neck    Family History  Problem Relation Age of Onset   Stroke Other    Diabetes Other    Stroke Father    Breast cancer Mother    Breast cancer Sister     Social History   Socioeconomic History   Marital status: Divorced    Spouse name: Not on file   Number of children: 2   Years of education: Not on file   Highest education level: Not on file  Occupational History   Not on file  Tobacco Use   Smoking status: Former    Packs/day: 0.50    Years: 13.00    Total pack years: 6.50    Types: Cigarettes    Quit date: 12/13/2016    Years since quitting: 5.4   Smokeless tobacco:  Never  Vaping Use   Vaping Use: Former  Substance and Sexual Activity   Alcohol use: Not Currently    Comment: occasional   Drug use: No   Sexual activity: Yes    Birth control/protection: Post-menopausal  Other Topics Concern   Not on file  Social History Narrative   Not on file   Social Determinants of Health   Financial Resource Strain: Not on file  Food Insecurity: Not on file  Transportation Needs: Not on file  Physical Activity: Not on file  Stress: Not on file  Social Connections: Not on file  Intimate Partner Violence: Not on file    ROS Review of Systems  Constitutional:  Negative for chills, fatigue and fever.  HENT:  Negative for ear pain, facial swelling and hearing loss.   Eyes:  Negative for photophobia, pain and itching.  Respiratory:  Negative for chest tightness and shortness of breath.   Cardiovascular:  Negative for chest pain and palpitations.  Gastrointestinal:  Positive for constipation and nausea.  Endocrine: Negative for polydipsia, polyphagia and polyuria.  Genitourinary:  Negative for frequency and urgency.  Musculoskeletal:  Negative for arthralgias.  Skin:  Negative for rash and wound.  Neurological:  Negative for tremors, facial asymmetry, weakness, light-headedness, numbness  and headaches.  Hematological:  Does not bruise/bleed easily.  Psychiatric/Behavioral:  Negative for self-injury and suicidal ideas.     Objective:   Today's Vitals: BP 121/83   Pulse 87   Ht 5' 8.5" (1.74 m)   Wt 135 lb (61.2 kg)   SpO2 97%   BMI 20.23 kg/m   Physical Exam Constitutional:      General: She is not in acute distress. HENT:     Head: Normocephalic.     Right Ear: External ear normal.     Left Ear: External ear normal.     Nose: No congestion.     Mouth/Throat:     Mouth: Mucous membranes are moist.  Eyes:     General: No visual field deficit.    Extraocular Movements: Extraocular movements intact.     Pupils: Pupils are equal, round, and  reactive to light.  Cardiovascular:     Rate and Rhythm: Normal rate and regular rhythm.     Pulses: Normal pulses.     Heart sounds: Normal heart sounds.  Pulmonary:     Effort: Pulmonary effort is normal.     Breath sounds: Normal breath sounds.  Abdominal:     Palpations: Abdomen is soft.  Musculoskeletal:     Right lower leg: No edema.     Left lower leg: No edema.  Lymphadenopathy:     Cervical: No cervical adenopathy.  Skin:    Coloration: Skin is not jaundiced or pale.  Neurological:     Mental Status: She is alert and oriented to person, place, and time.     Cranial Nerves: No facial asymmetry.     Sensory: Sensation is intact.     Motor: No tremor, atrophy or seizure activity.     Gait: Gait normal.  Psychiatric:     Comments: Normal affect     Assessment & Plan:   Problem List Items Addressed This Visit       Cardiovascular and Mediastinum   Essential hypertension    Controlled  She was informed that her antihypertensive can cause fatigue, but it is unlikely to cause weight loss, given she's not on any diuretic        Nervous and Auditory   Left-sided Bell's palsy    Seen and treated in the ED on 12/02/21 with prednisone No reported symptoms day Will continue to monitor        Immune and Lymphatic   Lymphadenopathy, generalized    C/o of increased fatigue  and wt loss following up with oncology  C/o of nausea with decreased appetite  Zofran ordered and encouraged her to increase her PO intake          Other   Absolute anemia   Constipation    Reports having hard BM every 2 days Denis hematochezia Recommend OTC miralax as needed for constipation Encouraged high fiber diet with increased fluids intake      Other Visit Diagnoses     Colon cancer screening    -  Primary   Relevant Orders   Ambulatory referral to Gastroenterology   Nausea       Relevant Medications   ondansetron (ZOFRAN) 4 MG tablet       Outpatient Encounter  Medications as of 05/17/2022  Medication Sig   ALPRAZolam (XANAX) 1 MG tablet Take 1 mg by mouth 4 (four) times daily as needed for anxiety.    amLODipine (NORVASC) 10 MG tablet Take 1 tablet (10 mg total) by mouth  daily.   imipramine (TOFRANIL) 25 MG tablet Take 25 mg by mouth at bedtime.   metoprolol tartrate (LOPRESSOR) 25 MG tablet Take 1 tablet (25 mg total) by mouth 2 (two) times daily.   Multiple Vitamin (MULTIVITAMIN) tablet Take 1 tablet by mouth daily.   ondansetron (ZOFRAN) 4 MG tablet Take 1 tablet (4 mg total) by mouth every 8 (eight) hours as needed for nausea or vomiting.   simvastatin (ZOCOR) 20 MG tablet Take 20 mg by mouth daily.   [DISCONTINUED] sulfamethoxazole-trimethoprim (BACTRIM DS) 800-160 MG tablet Take 1 tablet by mouth 2 (two) times daily. Take 1 bid   No facility-administered encounter medications on file as of 05/17/2022.    Follow-up: Return in about 3 months (around 08/17/2022).   Alvira Monday, FNP

## 2022-05-17 NOTE — Assessment & Plan Note (Addendum)
C/o of increased fatigue  and wt loss following up with oncology  C/o of nausea with decreased appetite  Zofran ordered and encouraged her to increase her PO intake

## 2022-05-17 NOTE — Patient Instructions (Addendum)
I appreciate the opportunity to provide care to you today!    Follow up:  3 months  Constipation  one dose of MiraLAX at any time of the day for up to 7 days    Please continue to a heart-healthy diet and increase your physical activities. Try to exercise for 71mns at least three times a week.      It was a pleasure to see you and I look forward to continuing to work together on your health and well-being. Please do not hesitate to call the office if you need care or have questions about your care.   Have a wonderful day and week. With Gratitude, GAlvira MondayMSN, FNP-BC

## 2022-05-17 NOTE — Assessment & Plan Note (Signed)
Reports having hard BM every 2 days Denis hematochezia Recommend OTC miralax as needed for constipation Encouraged high fiber diet with increased fluids intake

## 2022-05-17 NOTE — Assessment & Plan Note (Signed)
Seen and treated in the ED on 12/02/21 with prednisone No reported symptoms day Will continue to monitor

## 2022-05-22 ENCOUNTER — Inpatient Hospital Stay (HOSPITAL_BASED_OUTPATIENT_CLINIC_OR_DEPARTMENT_OTHER): Payer: 59 | Admitting: Hematology

## 2022-05-22 VITALS — BP 148/91 | HR 102 | Temp 98.2°F | Resp 19 | Ht 68.5 in | Wt 135.4 lb

## 2022-05-22 DIAGNOSIS — D508 Other iron deficiency anemias: Secondary | ICD-10-CM | POA: Diagnosis not present

## 2022-05-22 DIAGNOSIS — R591 Generalized enlarged lymph nodes: Secondary | ICD-10-CM

## 2022-05-22 NOTE — [place] Notes (Signed)
[place], [place] [address]   CLINIC:  Medical Oncology/Hematology  PCP:  Alvira Monday, [id number] / [place] [address]  [phone]  REASON FOR VISIT:  Follow-up for diffuse lymphadenopathy and hypercalcemia of malignancy  PRIOR THERAPY: none  CURRENT THERAPY: Surveillance  INTERVAL HISTORY:  Ms. Rain Wilhide, a 61 y.o. female, returns for follow-up of her generalized lymphadenopathy.  Denies any fevers or night sweats.  Weight has been stable since June.  She has received 3 doses of Venofer and noticed improvement in energy levels.  She had UTI for couple of times in the last 3 months.  No other infections noted.  REVIEW OF SYSTEMS:  Review of Systems  Constitutional:  Negative for appetite change, fatigue, fever and unexpected weight change.  Gastrointestinal:  Positive for nausea. Negative for constipation.  Endocrine: Negative for hot flashes.  All other systems reviewed and are negative.   PAST MEDICAL/SURGICAL HISTORY:  Past Medical History:  Diagnosis Date   Anxiety    Bell's palsy    Depression    High cholesterol    Hypertension    Past Surgical History:  Procedure Laterality Date   AXILLARY LYMPH NODE BIOPSY Right 01/17/2022   Procedure: AXILLARY LYMPH NODE BIOPSY;  Surgeon: Rusty Aus, DO;  Location: [facility];  Service: General;  Laterality: Right;   COLONOSCOPY N/A 04/17/2013   Procedure: COLONOSCOPY;  Surgeon: Rogene Houston, MD;  Location: [facility];  Service: Endoscopy;  Laterality: N/A;  930   CYST EXCISION     neck    SOCIAL HISTORY:  Social History   Socioeconomic History   Marital status: Divorced    Spouse name: Not on file   Number of children: 2   Years of education: Not on file   Highest education level: Not on file  Occupational History   Not on file  Tobacco Use   Smoking status: Former    Packs/day: 0.50    Years: 13.00    Total pack years: 6.50    Types:  Cigarettes    Quit date: 12/13/2016    Years since quitting: 5.4   Smokeless tobacco: Never  Vaping Use   Vaping Use: Former  Substance and Sexual Activity   Alcohol use: Not Currently    Comment: occasional   Drug use: No   Sexual activity: Yes    Birth control/protection: Post-menopausal  Other Topics Concern   Not on file  Social History Narrative   Not on file   Social Determinants of Health   Financial Resource Strain: Not on file  Food Insecurity: Not on file  Transportation Needs: Not on file  Physical Activity: Not on file  Stress: Not on file  Social Connections: Not on file  Intimate Partner Violence: Not on file    FAMILY HISTORY:  Family History  Problem Relation Age of Onset   Stroke Other    Diabetes Other    Stroke Father    Breast cancer Mother    Breast cancer Sister     CURRENT MEDICATIONS:  Current Outpatient Medications  Medication Sig Dispense Refill   ALPRAZolam (XANAX) 1 MG tablet Take 1 mg by mouth 4 (four) times daily as needed for anxiety.      amLODipine (NORVASC) 10 MG tablet Take 1 tablet (10 mg total) by mouth daily. 30 tablet 0   imipramine (TOFRANIL) 25 MG tablet Take 25 mg by mouth at bedtime.  metoprolol tartrate (LOPRESSOR) 25 MG tablet Take 1 tablet (25 mg total) by mouth 2 (two) times daily. 60 tablet 0   Multiple Vitamin (MULTIVITAMIN) tablet Take 1 tablet by mouth daily.     ondansetron (ZOFRAN) 4 MG tablet Take 1 tablet (4 mg total) by mouth every 8 (eight) hours as needed for nausea or vomiting. 20 tablet 0   phenazopyridine (PYRIDIUM) 100 MG tablet Take 1 tablet every 6 hours by oral route for 2 days.     simvastatin (ZOCOR) 20 MG tablet Take 20 mg by mouth daily.     ibuprofen (IBU) 800 MG tablet      No current facility-administered medications for this visit.    ALLERGIES:  Allergies  Allergen Reactions   Doxycycline Nausea Only    Other reaction(s): Other, Respiratory Distress Nausea, loss of appetite    Penicillins Hives and Nausea Only   Ciprofloxacin Other (See Comments)    Dry mouth, skin tingling, panicky, stomach pain, anxious and nervous Other reaction(s): Other (See Comments) Dry mouth, skin tingling, panicky, stomach pain, anxious and nervous   Estrace [Estradiol]    Nitrofurantoin Other (See Comments)    Chills and hot flashes    Polymox [Amoxicillin] Hives   Progesterone     PHYSICAL EXAM:  Performance status (ECOG): 1 - Symptomatic but completely ambulatory  Vitals:   05/22/22 1441  BP: (!) 148/91  Pulse: (!) 102  Resp: 19  Temp: 98.2 F (36.8 C)  SpO2: 97%   Wt Readings from Last 3 Encounters:  05/22/22 135 lb 6.4 oz (61.4 kg)  05/17/22 135 lb (61.2 kg)  03/22/22 145 lb (65.8 kg)   Physical Exam Vitals reviewed.  Constitutional:      Appearance: Normal appearance.  Cardiovascular:     Rate and Rhythm: Normal rate and regular rhythm.     Pulses: Normal pulses.     Heart sounds: Normal heart sounds.  Pulmonary:     Effort: Pulmonary effort is normal.     Breath sounds: Normal breath sounds.  Neurological:     General: No focal deficit present.     Mental Status: She is alert and oriented to person, place, and time.  Psychiatric:        Mood and Affect: Mood normal.        Behavior: Behavior normal.     LABORATORY DATA:  I have reviewed the labs as listed.     Latest Ref Rng & Units 05/17/2022   11:23 AM 03/23/2022   11:01 AM 01/23/2022   12:39 PM  CBC  WBC 4.0 - 10.5 K/uL 9.0  7.8  10.9   Hemoglobin 12.0 - 15.0 g/dL 14.5  13.2  8.3   Hematocrit 36.0 - 46.0 % 44.7  40.2  25.1   Platelets 150 - 400 K/uL 427  320  511       Latest Ref Rng & Units 03/23/2022   11:01 AM 01/23/2022   12:39 PM 01/18/2022    4:28 AM  CMP  Glucose 70 - 99 mg/dL 108  120  107   BUN 6 - 20 mg/dL $Remove'14  13  13   'FUrIKUC$ Creatinine 0.44 - 1.00 mg/dL 1.26  1.45  1.38   Sodium 135 - 145 mmol/L 131  136  136   Potassium 3.5 - 5.1 mmol/L 3.6  2.8  2.9   Chloride 98 - 111 mmol/L 101   108  109   CO2 22 - 32 mmol/L 25  21  22  Calcium 8.9 - 10.3 mg/dL 8.6  9.3  9.1   Total Protein 6.5 - 8.1 g/dL 7.3  7.6    Total Bilirubin 0.3 - 1.2 mg/dL 0.3  0.6    Alkaline Phos 38 - 126 U/L 152  134    AST 15 - 41 U/L 52  30    ALT 0 - 44 U/L 38  26        Component Value Date/Time   RBC 5.28 (H) 05/17/2022 1123   MCV 84.7 05/17/2022 1123   MCH 27.5 05/17/2022 1123   MCHC 32.4 05/17/2022 1123   RDW 14.0 05/17/2022 1123   LYMPHSABS 1.3 05/17/2022 1123   MONOABS 1.0 05/17/2022 1123   EOSABS 0.1 05/17/2022 1123   BASOSABS 0.1 05/17/2022 1123    DIAGNOSTIC IMAGING:  I have independently reviewed the scans and discussed with the patient. No results found.   ASSESSMENT:  1.  Hypercalcemia of malignancy: - Presentation with calcium level 13.4, creatinine 2.43, albumin 3.6. - Status post IV hydration and Zometa. - Calcium today improved to 11.1 and creatinine 1.8.  Albumin is 2.8. - 25-hydroxy vitamin D and one 25-hydroxy vitamin D within normal limits.  PTH was low at 12. - PTH RP is pending. - CT CAP mild bilateral hilar and subpectoral lymphadenopathy.  Right axillary lymph node 1.5 cm.  Multiple subcentimeter mediastinal lymph nodes.  Mild abdominal lymphadenopathy in the retroperitoneum and small bowel mesentery.  Mild pelvic lymphadenopathy seen throughout the iliac chains and bilateral inguinal region.  Spleen is normal. - Right axillary lymph node biopsy (01/17/2022): Noncaseating granulomatous inflammation.  No significant staining for CD30.  Predominant T-cell population.  CD68 highlights increased histiocytes.  HHV 8 and EBV by ISH negative. - PET scan (02/02/2022): Mild hypermetabolic lymphadenopathy throughout the neck, chest, abdomen and pelvis.  SUV value in the abdomen and pelvis range between 4.2-4.5.   2.  Acute kidney injury: - Creatinine on admission 2.4.  Improved to 1.8 today with hydration and Zometa.  3.  Social/family history: -She lives at home with her  son.  She works at a [employer] in [place].  She has exposure to acetone and IPA.  She quit smoking more than 5 years ago. - No family history of sarcoidosis.  Mother has multiple myeloma.  No other malignancies.   PLAN:  1.  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 - She is going back to work 8 to 12 hours daily. - I have recommended another PET scan in 3 months to follow-up on the lymphadenopathy in the abdomen and pelvis.  If there is any significant changes in the SUV value, I would recommend repeat biopsy to rule out any lymphomatous process.  If she is having any symptoms and no evidence of malignancy, will refer her to sarcoidosis clinic.   2.  Hypercalcemia of malignancy: - Last calcium was 8.6 with almond 3.3.  We will closely monitor.  3.  CKD: - Last creatinine was 1.26, improved from 1.45.  Recommend increased fluid intake.  4.  Normocytic anemia: - She received Venofer 3 infusions completed on 03/10/2022.  Ferritin is 350 and percent saturation 32.  Hemoglobin improved to 14.5.  Orders placed this encounter:  Orders Placed This Encounter  Procedures   CBC with Differential/Platelet   Comprehensive metabolic panel   Lactate dehydrogenase   Ferritin   Iron and TIBC     Derek Jack, MD [facility]  [phone]

## 2022-05-22 NOTE — Patient Instructions (Addendum)
[place] at [facility] Discharge Instructions  You were seen and examined today by Dr. Delton Coombes.  Dr. Delton Coombes discussed your most recent lab work and your iron levels are good and your hemoglobin has improved. Be sure to drinking lots of water to help with your kidney levels.  Follow-up as scheduled in 3 months with labs and PET scan.    Thank you for choosing [place] at [facility] to provide your oncology and hematology care.  To afford each patient quality time with our provider, please arrive at least 15 minutes before your scheduled appointment time.   If you have a lab appointment with the [facility] please come in thru the Main Entrance and check in at the main information desk.  You need to re-schedule your appointment should you arrive 10 or more minutes late.  We strive to give you quality time with our providers, and arriving late affects you and other patients whose appointments are after yours.  Also, if you no show three or more times for appointments you may be dismissed from the clinic at the providers discretion.     Again, thank you for choosing [facility].  Our hope is that these requests will decrease the amount of time that you wait before being seen by our physicians.       _____________________________________________________________  Should you have questions after your visit to [facility], please contact our office at [phone] and follow the prompts.  Our office hours are 8:00 a.m. and 4:30 p.m. Monday - Friday.  Please note that voicemails left after 4:00 p.m. may not be returned until the following business day.  We are closed weekends and major holidays.  You do have access to a nurse 24-7, just call the main number to the clinic [phone] and do not press any options, hold on the line and a nurse will answer the phone.    For prescription refill requests, have your  pharmacy contact our office and allow 72 hours.

## 2022-06-13 ENCOUNTER — Encounter: Payer: Self-pay | Admitting: Family Medicine

## 2022-06-13 ENCOUNTER — Ambulatory Visit (INDEPENDENT_AMBULATORY_CARE_PROVIDER_SITE_OTHER): Payer: 59 | Admitting: Family Medicine

## 2022-06-13 VITALS — BP 139/85 | HR 94 | Ht 68.5 in | Wt 131.1 lb

## 2022-06-13 DIAGNOSIS — Z1211 Encounter for screening for malignant neoplasm of colon: Secondary | ICD-10-CM | POA: Diagnosis not present

## 2022-06-13 DIAGNOSIS — R21 Rash and other nonspecific skin eruption: Secondary | ICD-10-CM | POA: Insufficient documentation

## 2022-06-13 DIAGNOSIS — J320 Chronic maxillary sinusitis: Secondary | ICD-10-CM

## 2022-06-13 MED ORDER — TRIAMCINOLONE ACETONIDE 0.1 % EX CREA: 1.0000 | TOPICAL_CREAM | Freq: Two times a day (BID) | CUTANEOUS | 0 refills | Status: DC

## 2022-06-13 MED ORDER — FLUTICASONE PROPIONATE 50 MCG/ACT NA SUSP: 2.0000 | Freq: Every day | NASAL | 6 refills | Status: DC

## 2022-06-13 NOTE — [place] Notes (Addendum)
Acute Office Visit  Subjective:     Patient ID: Leah Padilla, female    DOB: [birth year], 61 y.o.   MRN: [id number]  Chief Complaint  Patient presents with   Rash    Pt c/o rash breakout on her back and chest is a little itchy noticed it on 06/03/22.   Sinus Problem    Pt c/o sinus problem, sx of drainage and nauseous feels pressure in her eyes.     Rash Pertinent negatives include no fever, sore throat or vomiting.  Sinus Problem Pertinent negatives include no chills, ear pain or sore throat.   The patient is in today for c/o a rash on her back and sinus infection.  She reports chronic hx of sinus infection with c/o of headaches, sinus pressure, sinus pain and post-nasal drip. She denies cough, fever, and chills.   Rash: onset of symptoms on 06/07/22. She c/o that the rash is pruritic with a  burning sensation. She denies symptoms of anaphylaxis. She denies changes in her detergent, lotion or soap. She reports hx of atopic dermatitis.      Review of Systems  Constitutional:  Negative for chills and fever.  HENT:  Positive for sinus pain. Negative for ear discharge, ear pain, hearing loss, sore throat and tinnitus.   Gastrointestinal:  Negative for nausea and vomiting.  Skin:  Positive for rash.        Objective:    BP 139/85   Pulse 94   Ht 5' 8.5" (1.74 m)   Wt 131 lb 1.9 oz (59.5 kg)   SpO2 98%   BMI 19.65 kg/m    Physical Exam HENT:     Head: Normocephalic.     Nose:     Right Sinus: Maxillary sinus tenderness present.     Left Sinus: Maxillary sinus tenderness present.  Eyes:     General:        Right eye: Right eye discharge: mucinex.  Cardiovascular:     Rate and Rhythm: Normal rate and regular rhythm.     Pulses: Normal pulses.     Heart sounds: Normal heart sounds.  Pulmonary:     Effort: Pulmonary effort is normal.     Breath sounds: Normal breath sounds.  Skin:    Findings: Rash present.     Comments: Erythematous macule on back and chest      No results found for any visits on 06/13/22.      Assessment & Plan:   Problem List Items Addressed This Visit       Respiratory   Chronic sinusitis of both maxillary sinuses    The patient is taking Imipramine, which interacts with Mucinex sinus max and Norel-AD Will order flonase nasal spray  Encouraged to take tylenol for headaches      Relevant Medications   fluticasone (FLONASE) 50 MCG/ACT nasal spray     Musculoskeletal and Integument   Rash and nonspecific skin eruption - Primary    Rash of unknown etiology She denies symptoms of anaphylaxis Will treat with kenalog cream      Other Visit Diagnoses     Colon cancer screening       Relevant Orders   Cologuard   Maxillary sinusitis, unspecified chronicity       Relevant Medications   fluticasone (FLONASE) 50 MCG/ACT nasal spray   Rash       Relevant Medications   triamcinolone cream (KENALOG) 0.1 %       Meds ordered this encounter  Medications   fluticasone (FLONASE) 50 MCG/ACT nasal spray    Sig: Place 2 sprays into both nostrils daily.    Dispense:  16 g    Refill:  6   triamcinolone cream (KENALOG) 0.1 %    Sig: Apply 1 Application topically 2 (two) times daily.    Dispense:  30 g    Refill:  0    Return if symptoms worsen or fail to improve.  Alvira Monday, FNP

## 2022-06-13 NOTE — Patient Instructions (Addendum)
I appreciate the opportunity to provide care to you today!    Follow up:  08/18/22  Please pick up your medications at the pharmacy and take otc tylenol for headaches   Please continue to a heart-healthy diet and increase your physical activities. Try to exercise for 42mns at least three times a week.      It was a pleasure to see you and I look forward to continuing to work together on your health and well-being. Please do not hesitate to call the office if you need care or have questions about your care.   Have a wonderful day and week. With Gratitude, GAlvira MondayMSN, FNP-BC

## 2022-06-13 NOTE — Assessment & Plan Note (Signed)
The patient is taking Imipramine, which interacts with Mucinex sinus max and Norel-AD Will order flonase nasal spray  Encouraged to take tylenol for headaches

## 2022-06-13 NOTE — Assessment & Plan Note (Signed)
Rash of unknown etiology She denies symptoms of anaphylaxis Will treat with kenalog cream

## 2022-06-13 NOTE — Addendum Note (Signed)
Addended byAlvira Monday on: 06/13/2022 06:00 PM   Modules accepted: Orders

## 2022-06-15 ENCOUNTER — Telehealth: Payer: Self-pay

## 2022-06-15 ENCOUNTER — Encounter: Payer: Self-pay | Admitting: Family Medicine

## 2022-06-15 NOTE — Telephone Encounter (Signed)
Note given

## 2022-06-15 NOTE — Telephone Encounter (Signed)
Patient came by the office and needs a note for work. Was seen in office on 09.12.2023

## 2022-06-29 ENCOUNTER — Other Ambulatory Visit: Payer: Self-pay | Admitting: Internal Medicine

## 2022-06-29 ENCOUNTER — Ambulatory Visit (INDEPENDENT_AMBULATORY_CARE_PROVIDER_SITE_OTHER): Payer: 59 | Admitting: Internal Medicine

## 2022-06-29 ENCOUNTER — Encounter: Payer: Self-pay | Admitting: Internal Medicine

## 2022-06-29 VITALS — BP 118/86 | HR 95 | Resp 18 | Ht 68.5 in | Wt 131.0 lb

## 2022-06-29 DIAGNOSIS — Z8639 Personal history of other endocrine, nutritional and metabolic disease: Secondary | ICD-10-CM | POA: Insufficient documentation

## 2022-06-29 DIAGNOSIS — N3001 Acute cystitis with hematuria: Secondary | ICD-10-CM | POA: Insufficient documentation

## 2022-06-29 DIAGNOSIS — R21 Rash and other nonspecific skin eruption: Secondary | ICD-10-CM | POA: Diagnosis not present

## 2022-06-29 DIAGNOSIS — G2581 Restless legs syndrome: Secondary | ICD-10-CM | POA: Insufficient documentation

## 2022-06-29 DIAGNOSIS — N3000 Acute cystitis without hematuria: Secondary | ICD-10-CM | POA: Insufficient documentation

## 2022-06-29 MED ORDER — ROPINIROLE HCL 0.5 MG PO TABS: 0.5000 mg | ORAL_TABLET | Freq: Every day | ORAL | 1 refills | Status: DC

## 2022-06-29 NOTE — Patient Instructions (Signed)
Please start taking Ropinirole at bedtime.

## 2022-06-29 NOTE — [place] Notes (Signed)
Acute Office Visit  Subjective:    Patient ID: Leah Padilla, female    DOB: 1961/08/29, 61 y.o.   MRN: [id number]  Chief Complaint  Patient presents with   Acute Visit    Pt having leg pain in both legs mainly at night feels like she needs to move them all the time this has been going on since 06-15-22    HPI Patient is in today for c/o b/l leg pain, which is worse at nighttime.  She feels like constant urge to move her legs and feels better after moving her legs or walking.  Her leg pain is chronic, but has been worse for the last 2 weeks.  She denies any recent injury.  She has chronic numbness of the LE.  Of note, she had IDA, but her iron profile has improved after iron transfusions.  Past Medical History:  Diagnosis Date   Anxiety    Bell's palsy    Depression    High cholesterol    Hypertension     Past Surgical History:  Procedure Laterality Date   AXILLARY LYMPH NODE BIOPSY Right 01/17/2022   Procedure: AXILLARY LYMPH NODE BIOPSY;  Surgeon: Rusty Aus, DO;  Location: [facility];  Service: General;  Laterality: Right;   COLONOSCOPY N/A 04/17/2013   Procedure: COLONOSCOPY;  Surgeon: Rogene Houston, MD;  Location: [facility];  Service: Endoscopy;  Laterality: N/A;  930   CYST EXCISION     neck    Family History  Problem Relation Age of Onset   Stroke Other    Diabetes Other    Stroke Father    Breast cancer Mother    Breast cancer Sister     Social History   Socioeconomic History   Marital status: Divorced    Spouse name: Not on file   Number of children: 2   Years of education: Not on file   Highest education level: Not on file  Occupational History   Not on file  Tobacco Use   Smoking status: Former    Packs/day: 0.50    Years: 13.00    Total pack years: 6.50    Types: Cigarettes    Quit date: 12/13/2016    Years since quitting: 5.5   Smokeless tobacco: Never  Vaping Use   Vaping Use: Former  Substance and Sexual Activity   Alcohol  use: Not Currently    Comment: occasional   Drug use: No   Sexual activity: Yes    Birth control/protection: Post-menopausal  Other Topics Concern   Not on file  Social History Narrative   Not on file   Social Determinants of Health   Financial Resource Strain: Not on file  Food Insecurity: Not on file  Transportation Needs: Not on file  Physical Activity: Not on file  Stress: Not on file  Social Connections: Not on file  Intimate Partner Violence: Not on file    Outpatient Medications Prior to Visit  Medication Sig Dispense Refill   ALPRAZolam (XANAX) 1 MG tablet Take 1 mg by mouth 4 (four) times daily as needed for anxiety.      amLODipine (NORVASC) 10 MG tablet Take 1 tablet (10 mg total) by mouth daily. 30 tablet 0   fluticasone (FLONASE) 50 MCG/ACT nasal spray Place 2 sprays into both nostrils daily. 16 g 6   imipramine (TOFRANIL) 25 MG tablet Take 50 mg by mouth at bedtime.     metoprolol tartrate (LOPRESSOR) 25 MG tablet Take 1  tablet (25 mg total) by mouth 2 (two) times daily. 60 tablet 0   Multiple Vitamin (MULTIVITAMIN) tablet Take 1 tablet by mouth daily.     ondansetron (ZOFRAN) 4 MG tablet Take 1 tablet (4 mg total) by mouth every 8 (eight) hours as needed for nausea or vomiting. 20 tablet 0   simvastatin (ZOCOR) 20 MG tablet Take 20 mg by mouth daily.     triamcinolone cream (KENALOG) 0.1 % Apply 1 Application topically 2 (two) times daily. 30 g 0   No facility-administered medications prior to visit.    Allergies  Allergen Reactions   Doxycycline Nausea Only    Other reaction(s): Other, Respiratory Distress Nausea, loss of appetite   Penicillins Hives and Nausea Only   Ciprofloxacin Other (See Comments)    Dry mouth, skin tingling, panicky, stomach pain, anxious and nervous Other reaction(s): Other (See Comments) Dry mouth, skin tingling, panicky, stomach pain, anxious and nervous   Estrace [Estradiol]    Nitrofurantoin Other (See Comments)    Chills and  hot flashes    Polymox [Amoxicillin] Hives   Progesterone     Review of Systems  Constitutional:  Negative for chills and fever.  Respiratory:  Negative for cough and shortness of breath.   Cardiovascular:  Negative for chest pain, palpitations and leg swelling.  Genitourinary:  Negative for dysuria and hematuria.  Musculoskeletal:  Negative for neck pain and neck stiffness.  Skin:  Negative for rash.  Neurological:  Positive for weakness and numbness.  Psychiatric/Behavioral:  Negative for agitation and behavioral problems. The patient is nervous/anxious.        Objective:    Physical Exam Constitutional:      General: She is not in acute distress.    Appearance: She is not diaphoretic.  Eyes:     General: No scleral icterus.    Extraocular Movements: Extraocular movements intact.  Cardiovascular:     Rate and Rhythm: Normal rate and regular rhythm.     Heart sounds: Normal heart sounds. No murmur heard. Pulmonary:     Breath sounds: Normal breath sounds. No wheezing or rales.  Musculoskeletal:     Right lower leg: No edema.     Left lower leg: No edema.  Skin:    General: Skin is warm.     Findings: No rash.  Neurological:     General: No focal deficit present.     Mental Status: She is alert and oriented to person, place, and time.  Psychiatric:        Mood and Affect: Mood is depressed.        Behavior: Behavior is slowed.     BP 118/86 (BP Location: Right Arm, Patient Position: Sitting, Cuff Size: Normal)   Pulse 95   Resp 18   Ht 5' 8.5" (1.74 m)   Wt 131 lb (59.4 kg)   SpO2 100%   BMI 19.63 kg/m  Wt Readings from Last 3 Encounters:  06/29/22 131 lb (59.4 kg)  06/13/22 131 lb 1.9 oz (59.5 kg)  05/22/22 135 lb 6.4 oz (61.4 kg)        Assessment & Plan:   Problem List Items Addressed This Visit       Other   Restless legs syndrome - Primary    Her leg pain and constant urge to move her legs likely restless leg syndrome She also likely has  peripheral neuropathy Started ropinirole She has had iron transfusions - iron profile improved now  Meds ordered this encounter  Medications   rOPINIRole (REQUIP) 0.5 MG tablet    Sig: Take 1 tablet (0.5 mg total) by mouth at bedtime.    Dispense:  30 tablet    Refill:  1     Jenesa Foresta Keith Rake, MD

## 2022-06-29 NOTE — Assessment & Plan Note (Signed)
Her leg pain and constant urge to move her legs likely restless leg syndrome She also likely has peripheral neuropathy Started ropinirole She has had iron transfusions - iron profile improved now

## 2022-06-30 MED ORDER — TRIAMCINOLONE ACETONIDE 0.1 % EX CREA: 1.0000 | TOPICAL_CREAM | Freq: Two times a day (BID) | CUTANEOUS | 0 refills | Status: DC

## 2022-06-30 NOTE — Addendum Note (Signed)
Addended byIhor Dow on: 06/30/2022 01:34 AM   Modules accepted: Orders

## 2022-07-14 ENCOUNTER — Other Ambulatory Visit: Payer: Self-pay | Admitting: Family Medicine

## 2022-07-14 DIAGNOSIS — R195 Other fecal abnormalities: Secondary | ICD-10-CM

## 2022-07-14 LAB — COLOGUARD: COLOGUARD: POSITIVE — AB

## 2022-07-14 NOTE — [place] Notes (Signed)
Please inform the patient that her cologurad is positive. A positive result may indicate the presence of colorectal cancer or advanced adenoma and should be followed by colonoscopy.  A referral to GI is placed

## 2022-07-17 ENCOUNTER — Telehealth: Payer: Self-pay | Admitting: Family Medicine

## 2022-07-17 ENCOUNTER — Other Ambulatory Visit: Payer: Self-pay

## 2022-07-17 ENCOUNTER — Encounter (INDEPENDENT_AMBULATORY_CARE_PROVIDER_SITE_OTHER): Payer: Self-pay | Admitting: *Deleted

## 2022-07-17 DIAGNOSIS — R195 Other fecal abnormalities: Secondary | ICD-10-CM

## 2022-07-17 NOTE — Telephone Encounter (Signed)
Referral was sent in as STAT to [facility], pt said she got a call saying first availability would be until December she needs to get in sooner before he insurance runs out, please advice?

## 2022-07-17 NOTE — Telephone Encounter (Signed)
Patient needs new referral to gastro   Last one is outdated. Has positive cologaurd results needs urgent new gastro referral.   Can call patient back.

## 2022-07-17 NOTE — Telephone Encounter (Signed)
New referral has been put in.

## 2022-07-17 NOTE — Telephone Encounter (Signed)
Patient called needs appt before end of October 2023 insurance ends on 08/01/2022

## 2022-07-18 ENCOUNTER — Other Ambulatory Visit: Payer: Self-pay

## 2022-07-18 ENCOUNTER — Other Ambulatory Visit: Payer: Self-pay | Admitting: Family Medicine

## 2022-07-18 DIAGNOSIS — R195 Other fecal abnormalities: Secondary | ICD-10-CM

## 2022-07-18 NOTE — Telephone Encounter (Signed)
I've placed an urgent referral to Dr. Laural Golden

## 2022-07-18 NOTE — Telephone Encounter (Signed)
Referral sent to [facility] per patient preference, Dr. Laural Golden is not practicing anymore.

## 2022-07-18 NOTE — [place] Notes (Signed)
Ast

## 2022-07-19 ENCOUNTER — Encounter: Payer: Self-pay | Admitting: *Deleted

## 2022-07-25 ENCOUNTER — Encounter: Payer: Self-pay | Admitting: *Deleted

## 2022-07-25 ENCOUNTER — Encounter: Payer: Self-pay | Admitting: Gastroenterology

## 2022-07-25 ENCOUNTER — Telehealth: Payer: Self-pay | Admitting: *Deleted

## 2022-07-25 ENCOUNTER — Ambulatory Visit (INDEPENDENT_AMBULATORY_CARE_PROVIDER_SITE_OTHER): Payer: 59 | Admitting: Gastroenterology

## 2022-07-25 VITALS — BP 148/96 | HR 106 | Temp 97.8°F | Ht 68.5 in | Wt 127.4 lb

## 2022-07-25 DIAGNOSIS — K5900 Constipation, unspecified: Secondary | ICD-10-CM

## 2022-07-25 DIAGNOSIS — R195 Other fecal abnormalities: Secondary | ICD-10-CM | POA: Diagnosis not present

## 2022-07-25 MED ORDER — CLENPIQ 10-3.5-12 MG-GM -GM/175ML PO SOLN: 1.0000 | ORAL | 0 refills | Status: DC

## 2022-07-25 NOTE — Progress Notes (Signed)
GI Office Note    Referring Provider: Alvira Monday, [place] Primary Care Physician:  Alvira Monday, [place]  Primary Gastroenterologist: Elon Alas. Abbey Chatters, DO   Chief Complaint   Chief Complaint  Patient presents with   Colonoscopy    Positive cologuard     History of Present Illness   Leah Padilla is a 61 y.o. female presenting today at the request of Alvira Monday, FNP for positive Cologuard.  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  Also with normocytic anemia, received Venofer 3 infusions completed by June 2023.  PET scan May 2023 showed mild hypermetabolic lymphadenopathy throughout the neck, chest, abdomen, pelvis, suspicious for lymphoproliferative disorder.  7.3 cm postop hematoma noted in the right axilla.  Biopsy from right axillary lymph node showed noncaseating granulomatous inflammation.  Patient states she has been sick since the spring of this year.  Initially developed Bell's palsy.  She had poor appetite, nausea, substantial weight loss of over 40 pounds.  At baseline she has significant stress and anxiety and with health issues this is only worsened.  She continues to follow with psychiatry.  Unfortunately she was laid off from her job, looses insurance at the end of the month.  She is worried about losing her house.  Continues to have a poor appetite.  She is able to tolerate liquids.  She has to force herself to eat.  Denies dysphagia.  She has nausea but no vomiting.  Denies abdominal pain.  At baseline typically would have a bowel movement every 1 to 2 days but now she is having increased issues with constipation.  Taking MiraLAX half a capful daily and having a bowel movement about twice per week.  Denies melena or rectal bleeding.  No heartburn as long as she avoids greasy foods.  She also has been dealing with a rash for the past 3 to 4 weeks, involves the torso.  Seen by PCP  and prescribed a cream, no improvement in symptoms.  Symptoms are stable. She has a follow-up PET scan this week.   Last colonoscopy in July 2014, tortuous colon, small hemorrhoids below the dentate line, 4 x 8 mm tubular adenoma removed from the ascending colon.  Recommended to have 5-year surveillance colonoscopy.  CT chest/abdomen/pelvis January 15, 2022: IMPRESSION: Mild lymphadenopathy throughout the chest, abdomen, and pelvis. This is suspicious for lymphoproliferative disorder, or less likely metastatic disease.   Tiny nonobstructing left renal calculus. No evidence of ureteral calculi or hydronephrosis  NM PET 01/2022: IMPRESSION: Mild hypermetabolic lymphadenopathy throughout the neck, chest, abdomen, and pelvis, suspicious for lymphoproliferative disorder. (If lymphoma is diagnosed, Deauville score is 4.)   7.3 cm postop hematoma noted in the right axilla.    Medications   Current Outpatient Medications  Medication Sig Dispense Refill   ALPRAZolam (XANAX) 1 MG tablet Take 1 mg by mouth 4 (four) times daily as needed for anxiety.      amLODipine (NORVASC) 10 MG tablet Take 1 tablet (10 mg total) by mouth daily. 30 tablet 0   fluticasone (FLONASE) 50 MCG/ACT nasal spray Place 2 sprays into both nostrils daily. 16 g 6   imipramine (TOFRANIL) 25 MG tablet Take 25 mg by mouth at bedtime.     metoprolol tartrate (LOPRESSOR) 25 MG tablet Take 1 tablet (25 mg total) by mouth 2 (two) times daily. 60 tablet 0   Multiple Vitamin (MULTIVITAMIN) tablet Take 1 tablet by mouth daily.  rOPINIRole (REQUIP) 0.5 MG tablet TAKE 1 TABLET(0.5 MG) BY MOUTH AT BEDTIME 90 tablet 2   simvastatin (ZOCOR) 20 MG tablet Take 20 mg by mouth daily.     triamcinolone cream (KENALOG) 0.1 % Apply 1 Application topically 2 (two) times daily. 30 g 0   No current facility-administered medications for this visit.    Allergies   Allergies as of 07/25/2022 - Review Complete 07/25/2022  Allergen Reaction  Noted   Doxycycline Nausea Only 07/23/2019   Penicillins Hives and Nausea Only 09/12/2015   Ciprofloxacin Other (See Comments) 09/09/2021   Estrace [estradiol]  07/23/2019   Nitrofurantoin Other (See Comments) 03/27/2022   Polymox [amoxicillin] Hives 09/12/2015   Progesterone  07/23/2019    Past Medical History   Past Medical History:  Diagnosis Date   Anxiety    Bell's palsy    Depression    High cholesterol    Hypertension     Past Surgical History   Past Surgical History:  Procedure Laterality Date   AXILLARY LYMPH NODE BIOPSY Right 01/17/2022   Procedure: AXILLARY LYMPH NODE BIOPSY;  Surgeon: Rusty Aus, DO;  Location: [facility];  Service: General;  Laterality: Right;   COLONOSCOPY N/A 04/17/2013   Procedure: COLONOSCOPY;  Surgeon: Rogene Houston, MD;  Location: [facility];  Service: Endoscopy;  Laterality: N/A;  930   CYST EXCISION     neck    Past Family History   Family History  Problem Relation Age of Onset   Multiple myeloma Mother    Stroke Father    Breast cancer Sister    Stroke Other    Diabetes Other    Colon cancer Neg Hx     Past Social History   Social History   Socioeconomic History   Marital status: Divorced    Spouse name: Not on file   Number of children: 2   Years of education: Not on file   Highest education level: Not on file  Occupational History   Not on file  Tobacco Use   Smoking status: Former    Packs/day: 0.50    Years: 13.00    Total pack years: 6.50    Types: Cigarettes    Quit date: 12/13/2016    Years since quitting: 5.6   Smokeless tobacco: Never  Vaping Use   Vaping Use: Former  Substance and Sexual Activity   Alcohol use: Not Currently    Comment: occasional   Drug use: Yes    Types: Marijuana   Sexual activity: Yes    Birth control/protection: Post-menopausal  Other Topics Concern   Not on file  Social History Narrative   Not on file   Social Determinants of Health   Financial Resource  Strain: Not on file  Food Insecurity: Not on file  Transportation Needs: Not on file  Physical Activity: Not on file  Stress: Not on file  Social Connections: Not on file  Intimate Partner Violence: Not on file    Review of Systems   General: Negative for fever, chills, fatigue, weakness. See hpi Eyes: Negative for vision changes.  ENT: Negative for hoarseness, difficulty swallowing , nasal congestion. CV: Negative for chest pain, angina, palpitations, dyspnea on exertion, peripheral edema.  Respiratory: Negative for dyspnea at rest, dyspnea on exertion, cough, sputum, wheezing.  GI: See history of present illness. GU:  Negative for dysuria, hematuria, urinary incontinence, urinary frequency, nocturnal urination.  MS: Negative for joint pain, low back pain.  Derm: Negative for rash  or itching.  Neuro: Negative for weakness, abnormal sensation, seizure, frequent headaches, memory loss,  confusion.  Psych: positive for anxiety, depression. No suicidal ideation, hallucinations.  Endo: see hpi.  Heme: Negative for bruising or bleeding. Allergy: Negative for rash or hives.  Physical Exam   BP (!) 148/96 (BP Location: Left Arm, Patient Position: Sitting, Cuff Size: Normal)   Pulse (!) 106   Temp 97.8 F (36.6 C) (Oral)   Ht 5' 8.5" (1.74 m)   Wt 127 lb 6.4 oz (57.8 kg)   SpO2 100%   BMI 19.09 kg/m    General: Well-nourished, well-developed in no acute distress. tearful Head: Normocephalic, atraumatic.   Eyes: Conjunctiva pink, no icterus. Mouth: Oropharyngeal mucosa moist and pink , no lesions erythema or exudate. Neck: Supple without thyromegaly, masses, or lymphadenopathy.  Lungs: Clear to auscultation bilaterally.  Heart: Regular rate and rhythm, no murmurs rubs or gallops.  Abdomen: Bowel sounds are normal, nontender, nondistended, no hepatosplenomegaly or masses,  no abdominal bruits or hernia, no rebound or guarding.   Rectal: not performed Extremities: No lower  extremity edema. No clubbing or deformities.  Neuro: Alert and oriented x 4 , grossly normal neurologically.  Skin: Warm and dry, no jaundice. Fine reticular, erythematous rash noted on the torso, anteriorly/posteriorly and extends into the lower abdomen/inguinal area.  Psych: Alert and cooperative, normal mood and affect.  Labs   Lab Results  Component Value Date   CREATININE 1.26 (H) 03/23/2022   BUN 14 03/23/2022   NA 131 (L) 03/23/2022   K 3.6 03/23/2022   CL 101 03/23/2022   CO2 25 03/23/2022   Lab Results  Component Value Date   WBC 9.0 05/17/2022   HGB 14.5 05/17/2022   HCT 44.7 05/17/2022   MCV 84.7 05/17/2022   PLT 427 (H) 05/17/2022   Lab Results  Component Value Date   ALT 38 03/23/2022   AST 52 (H) 03/23/2022   ALKPHOS 152 (H) 03/23/2022   BILITOT 0.3 03/23/2022   Lab Results  Component Value Date   IRON 71 05/17/2022   TIBC 222 (L) 05/17/2022   FERRITIN 350 (H) 05/17/2022   Lab Results  Component Value Date   VITAMINB12 540 01/23/2022   Lab Results  Component Value Date   FOLATE 19.4 01/23/2022   April 2023: Hepatitis C antibody negative, hepatitis B surface antigen negative, hepatitis B surface antibody negative  Imaging Studies   No results found.  Assessment   Positive Cologuard: remote colonoscopy in 2014. Has history of normocytic anemia from CKD and relative iron deficiency followed by hem/onc, receiving IV iron. She denies overt GI bleeding.   Weight loss/poor appetite/nausea: likely multifactorial and exacerbated by anxiety. Follow up pending PET. Colonoscopy as planned.   Constipation: poorly controlled.  Abnormal LFTs: viral markers negative. Liver imaging unremarkable by CT. Await pending PET.    PLAN   Start Linzess 214mg daily. Samples provided.  Colonoscopy with Dr. CAbbey Chatters ASA 2.  I have discussed the risks, alternatives, benefits with regards to but not limited to the risk of reaction to medication, bleeding, infection,  perforation and the patient is agreeable to proceed. Written consent to be obtained. F/u pending PET.    LLaureen Ochs LBobby [facility]umpf [facility]

## 2022-07-25 NOTE — H&P (View-Only) (Signed)
GI Office Note    Referring Provider: Alvira Monday, [place] Primary Care Physician:  Alvira Monday, [place]  Primary Gastroenterologist: Elon Alas. Abbey Chatters, DO   Chief Complaint   Chief Complaint  Patient presents with   Colonoscopy    Positive cologuard     History of Present Illness   Leah Padilla is a 61 y.o. female presenting today at the request of Alvira Monday, FNP for positive Cologuard.  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  Also with normocytic anemia, received Venofer 3 infusions completed by June 2023.  PET scan May 2023 showed mild hypermetabolic lymphadenopathy throughout the neck, chest, abdomen, pelvis, suspicious for lymphoproliferative disorder.  7.3 cm postop hematoma noted in the right axilla.  Biopsy from right axillary lymph node showed noncaseating granulomatous inflammation.  Patient states she has been sick since the spring of this year.  Initially developed Bell's palsy.  She had poor appetite, nausea, substantial weight loss of over 40 pounds.  At baseline she has significant stress and anxiety and with health issues this is only worsened.  She continues to follow with psychiatry.  Unfortunately she was laid off from her job, looses insurance at the end of the month.  She is worried about losing her house.  Continues to have a poor appetite.  She is able to tolerate liquids.  She has to force herself to eat.  Denies dysphagia.  She has nausea but no vomiting.  Denies abdominal pain.  At baseline typically would have a bowel movement every 1 to 2 days but now she is having increased issues with constipation.  Taking MiraLAX half a capful daily and having a bowel movement about twice per week.  Denies melena or rectal bleeding.  No heartburn as long as she avoids greasy foods.  She also has been dealing with a rash for the past 3 to 4 weeks, involves the torso.  Seen by PCP  and prescribed a cream, no improvement in symptoms.  Symptoms are stable. She has a follow-up PET scan this week.   Last colonoscopy in July 2014, tortuous colon, small hemorrhoids below the dentate line, 4 x 8 mm tubular adenoma removed from the ascending colon.  Recommended to have 5-year surveillance colonoscopy.  CT chest/abdomen/pelvis January 15, 2022: IMPRESSION: Mild lymphadenopathy throughout the chest, abdomen, and pelvis. This is suspicious for lymphoproliferative disorder, or less likely metastatic disease.   Tiny nonobstructing left renal calculus. No evidence of ureteral calculi or hydronephrosis  NM PET 01/2022: IMPRESSION: Mild hypermetabolic lymphadenopathy throughout the neck, chest, abdomen, and pelvis, suspicious for lymphoproliferative disorder. (If lymphoma is diagnosed, Deauville score is 4.)   7.3 cm postop hematoma noted in the right axilla.    Medications   Current Outpatient Medications  Medication Sig Dispense Refill   ALPRAZolam (XANAX) 1 MG tablet Take 1 mg by mouth 4 (four) times daily as needed for anxiety.      amLODipine (NORVASC) 10 MG tablet Take 1 tablet (10 mg total) by mouth daily. 30 tablet 0   fluticasone (FLONASE) 50 MCG/ACT nasal spray Place 2 sprays into both nostrils daily. 16 g 6   imipramine (TOFRANIL) 25 MG tablet Take 25 mg by mouth at bedtime.     metoprolol tartrate (LOPRESSOR) 25 MG tablet Take 1 tablet (25 mg total) by mouth 2 (two) times daily. 60 tablet 0   Multiple Vitamin (MULTIVITAMIN) tablet Take 1 tablet by mouth daily.  rOPINIRole (REQUIP) 0.5 MG tablet TAKE 1 TABLET(0.5 MG) BY MOUTH AT BEDTIME 90 tablet 2   simvastatin (ZOCOR) 20 MG tablet Take 20 mg by mouth daily.     triamcinolone cream (KENALOG) 0.1 % Apply 1 Application topically 2 (two) times daily. 30 g 0   No current facility-administered medications for this visit.    Allergies   Allergies as of 07/25/2022 - Review Complete 07/25/2022  Allergen Reaction  Noted   Doxycycline Nausea Only 07/23/2019   Penicillins Hives and Nausea Only 09/12/2015   Ciprofloxacin Other (See Comments) 09/09/2021   Estrace [estradiol]  07/23/2019   Nitrofurantoin Other (See Comments) 03/27/2022   Polymox [amoxicillin] Hives 09/12/2015   Progesterone  07/23/2019    Past Medical History   Past Medical History:  Diagnosis Date   Anxiety    Bell's palsy    Depression    High cholesterol    Hypertension     Past Surgical History   Past Surgical History:  Procedure Laterality Date   AXILLARY LYMPH NODE BIOPSY Right 01/17/2022   Procedure: AXILLARY LYMPH NODE BIOPSY;  Surgeon: Rusty Aus, DO;  Location: [facility];  Service: General;  Laterality: Right;   COLONOSCOPY N/A 04/17/2013   Procedure: COLONOSCOPY;  Surgeon: Rogene Houston, MD;  Location: [facility];  Service: Endoscopy;  Laterality: N/A;  930   CYST EXCISION     neck    Past Family History   Family History  Problem Relation Age of Onset   Multiple myeloma Mother    Stroke Father    Breast cancer Sister    Stroke Other    Diabetes Other    Colon cancer Neg Hx     Past Social History   Social History   Socioeconomic History   Marital status: Divorced    Spouse name: Not on file   Number of children: 2   Years of education: Not on file   Highest education level: Not on file  Occupational History   Not on file  Tobacco Use   Smoking status: Former    Packs/day: 0.50    Years: 13.00    Total pack years: 6.50    Types: Cigarettes    Quit date: 12/13/2016    Years since quitting: 5.6   Smokeless tobacco: Never  Vaping Use   Vaping Use: Former  Substance and Sexual Activity   Alcohol use: Not Currently    Comment: occasional   Drug use: Yes    Types: Marijuana   Sexual activity: Yes    Birth control/protection: Post-menopausal  Other Topics Concern   Not on file  Social History Narrative   Not on file   Social Determinants of Health   Financial Resource  Strain: Not on file  Food Insecurity: Not on file  Transportation Needs: Not on file  Physical Activity: Not on file  Stress: Not on file  Social Connections: Not on file  Intimate Partner Violence: Not on file    Review of Systems   General: Negative for fever, chills, fatigue, weakness. See hpi Eyes: Negative for vision changes.  ENT: Negative for hoarseness, difficulty swallowing , nasal congestion. CV: Negative for chest pain, angina, palpitations, dyspnea on exertion, peripheral edema.  Respiratory: Negative for dyspnea at rest, dyspnea on exertion, cough, sputum, wheezing.  GI: See history of present illness. GU:  Negative for dysuria, hematuria, urinary incontinence, urinary frequency, nocturnal urination.  MS: Negative for joint pain, low back pain.  Derm: Negative for rash  or itching.  Neuro: Negative for weakness, abnormal sensation, seizure, frequent headaches, memory loss,  confusion.  Psych: positive for anxiety, depression. No suicidal ideation, hallucinations.  Endo: see hpi.  Heme: Negative for bruising or bleeding. Allergy: Negative for rash or hives.  Physical Exam   BP (!) 148/96 (BP Location: Left Arm, Patient Position: Sitting, Cuff Size: Normal)   Pulse (!) 106   Temp 97.8 F (36.6 C) (Oral)   Ht 5' 8.5" (1.74 m)   Wt 127 lb 6.4 oz (57.8 kg)   SpO2 100%   BMI 19.09 kg/m    General: Well-nourished, well-developed in no acute distress. tearful Head: Normocephalic, atraumatic.   Eyes: Conjunctiva pink, no icterus. Mouth: Oropharyngeal mucosa moist and pink , no lesions erythema or exudate. Neck: Supple without thyromegaly, masses, or lymphadenopathy.  Lungs: Clear to auscultation bilaterally.  Heart: Regular rate and rhythm, no murmurs rubs or gallops.  Abdomen: Bowel sounds are normal, nontender, nondistended, no hepatosplenomegaly or masses,  no abdominal bruits or hernia, no rebound or guarding.   Rectal: not performed Extremities: No lower  extremity edema. No clubbing or deformities.  Neuro: Alert and oriented x 4 , grossly normal neurologically.  Skin: Warm and dry, no jaundice. Fine reticular, erythematous rash noted on the torso, anteriorly/posteriorly and extends into the lower abdomen/inguinal area.  Psych: Alert and cooperative, normal mood and affect.  Labs   Lab Results  Component Value Date   CREATININE 1.26 (H) 03/23/2022   BUN 14 03/23/2022   NA 131 (L) 03/23/2022   K 3.6 03/23/2022   CL 101 03/23/2022   CO2 25 03/23/2022   Lab Results  Component Value Date   WBC 9.0 05/17/2022   HGB 14.5 05/17/2022   HCT 44.7 05/17/2022   MCV 84.7 05/17/2022   PLT 427 (H) 05/17/2022   Lab Results  Component Value Date   ALT 38 03/23/2022   AST 52 (H) 03/23/2022   ALKPHOS 152 (H) 03/23/2022   BILITOT 0.3 03/23/2022   Lab Results  Component Value Date   IRON 71 05/17/2022   TIBC 222 (L) 05/17/2022   FERRITIN 350 (H) 05/17/2022   Lab Results  Component Value Date   VITAMINB12 540 01/23/2022   Lab Results  Component Value Date   FOLATE 19.4 01/23/2022   April 2023: Hepatitis C antibody negative, hepatitis B surface antigen negative, hepatitis B surface antibody negative  Imaging Studies   No results found.  Assessment   Positive Cologuard: remote colonoscopy in 2014. Has history of normocytic anemia from CKD and relative iron deficiency followed by hem/onc, receiving IV iron. She denies overt GI bleeding.   Weight loss/poor appetite/nausea: likely multifactorial and exacerbated by anxiety. Follow up pending PET. Colonoscopy as planned.   Constipation: poorly controlled.  Abnormal LFTs: viral markers negative. Liver imaging unremarkable by CT. Await pending PET.    PLAN   Start Linzess 214mg daily. Samples provided.  Colonoscopy with Dr. CAbbey Chatters ASA 2.  I have discussed the risks, alternatives, benefits with regards to but not limited to the risk of reaction to medication, bleeding, infection,  perforation and the patient is agreeable to proceed. Written consent to be obtained. F/u pending PET.    LLaureen Ochs LB[facility]

## 2022-07-25 NOTE — Telephone Encounter (Signed)
This Careers adviser does not currently require a prior authorization for these services. Notification is not a guarantee of coverage or payment.  Decision ID #:[id number]

## 2022-07-25 NOTE — Patient Instructions (Signed)
Samples of Linzess 275mg daily provided. Please take one daily on empty stomach for constipation. You can take every other day if you are moving your bowels adequately. Colonoscopy to be scheduled. See separate instructions.

## 2022-07-26 ENCOUNTER — Other Ambulatory Visit: Payer: Self-pay | Admitting: Internal Medicine

## 2022-07-26 DIAGNOSIS — G2581 Restless legs syndrome: Secondary | ICD-10-CM

## 2022-07-27 ENCOUNTER — Inpatient Hospital Stay (HOSPITAL_COMMUNITY): Payer: 59

## 2022-07-27 ENCOUNTER — Other Ambulatory Visit: Payer: Self-pay

## 2022-07-27 ENCOUNTER — Encounter (HOSPITAL_COMMUNITY): Payer: Self-pay | Admitting: Emergency Medicine

## 2022-07-27 ENCOUNTER — Inpatient Hospital Stay: Payer: 59 | Attending: Hematology

## 2022-07-27 ENCOUNTER — Inpatient Hospital Stay (HOSPITAL_COMMUNITY)
Admission: EM | Admit: 2022-07-27 | Discharge: 2022-07-30 | DRG: 683 | Disposition: A | Discharge status: Home / Self Care | Payer: 59 | Attending: Internal Medicine | Admitting: Internal Medicine

## 2022-07-27 ENCOUNTER — Encounter (HOSPITAL_COMMUNITY)
Admission: RE | Admit: 2022-07-27 | Discharge: 2022-07-27 | Disposition: A | Discharge status: Home / Self Care | Payer: 59 | Source: Ambulatory Visit | Attending: Hematology | Admitting: Hematology

## 2022-07-27 DIAGNOSIS — E7800 Pure hypercholesterolemia, unspecified: Secondary | ICD-10-CM | POA: Diagnosis present

## 2022-07-27 DIAGNOSIS — D509 Iron deficiency anemia, unspecified: Secondary | ICD-10-CM | POA: Insufficient documentation

## 2022-07-27 DIAGNOSIS — F32A Depression, unspecified: Secondary | ICD-10-CM | POA: Insufficient documentation

## 2022-07-27 DIAGNOSIS — N1832 Chronic kidney disease, stage 3b: Secondary | ICD-10-CM | POA: Insufficient documentation

## 2022-07-27 DIAGNOSIS — Z681 Body mass index (BMI) 19 or less, adult: Secondary | ICD-10-CM

## 2022-07-27 DIAGNOSIS — R591 Generalized enlarged lymph nodes: Secondary | ICD-10-CM | POA: Diagnosis present

## 2022-07-27 DIAGNOSIS — E860 Dehydration: Secondary | ICD-10-CM | POA: Diagnosis present

## 2022-07-27 DIAGNOSIS — Z87891 Personal history of nicotine dependence: Secondary | ICD-10-CM

## 2022-07-27 DIAGNOSIS — Z881 Allergy status to other antibiotic agents status: Secondary | ICD-10-CM | POA: Diagnosis not present

## 2022-07-27 DIAGNOSIS — K648 Other hemorrhoids: Secondary | ICD-10-CM | POA: Insufficient documentation

## 2022-07-27 DIAGNOSIS — Z888 Allergy status to other drugs, medicaments and biological substances status: Secondary | ICD-10-CM | POA: Diagnosis not present

## 2022-07-27 DIAGNOSIS — R195 Other fecal abnormalities: Secondary | ICD-10-CM | POA: Insufficient documentation

## 2022-07-27 DIAGNOSIS — E876 Hypokalemia: Secondary | ICD-10-CM | POA: Diagnosis present

## 2022-07-27 DIAGNOSIS — Z79899 Other long term (current) drug therapy: Secondary | ICD-10-CM

## 2022-07-27 DIAGNOSIS — Z23 Encounter for immunization: Secondary | ICD-10-CM

## 2022-07-27 DIAGNOSIS — K529 Noninfective gastroenteritis and colitis, unspecified: Secondary | ICD-10-CM | POA: Insufficient documentation

## 2022-07-27 DIAGNOSIS — I129 Hypertensive chronic kidney disease with stage 1 through stage 4 chronic kidney disease, or unspecified chronic kidney disease: Secondary | ICD-10-CM | POA: Insufficient documentation

## 2022-07-27 DIAGNOSIS — R634 Abnormal weight loss: Secondary | ICD-10-CM | POA: Diagnosis present

## 2022-07-27 DIAGNOSIS — K5900 Constipation, unspecified: Secondary | ICD-10-CM | POA: Diagnosis present

## 2022-07-27 DIAGNOSIS — D631 Anemia in chronic kidney disease: Secondary | ICD-10-CM | POA: Insufficient documentation

## 2022-07-27 DIAGNOSIS — Z1211 Encounter for screening for malignant neoplasm of colon: Secondary | ICD-10-CM | POA: Insufficient documentation

## 2022-07-27 DIAGNOSIS — I10 Essential (primary) hypertension: Secondary | ICD-10-CM

## 2022-07-27 DIAGNOSIS — Z880 Allergy status to penicillin: Secondary | ICD-10-CM

## 2022-07-27 DIAGNOSIS — D508 Other iron deficiency anemias: Secondary | ICD-10-CM

## 2022-07-27 DIAGNOSIS — G2581 Restless legs syndrome: Secondary | ICD-10-CM | POA: Insufficient documentation

## 2022-07-27 DIAGNOSIS — F419 Anxiety disorder, unspecified: Secondary | ICD-10-CM | POA: Diagnosis present

## 2022-07-27 DIAGNOSIS — N179 Acute kidney failure, unspecified: Principal | ICD-10-CM | POA: Diagnosis present

## 2022-07-27 DIAGNOSIS — N189 Chronic kidney disease, unspecified: Secondary | ICD-10-CM | POA: Insufficient documentation

## 2022-07-27 LAB — BASIC METABOLIC PANEL
Anion gap: 6 (ref 5–15)
BUN: 21 mg/dL — ABNORMAL HIGH (ref 6–20)
CO2: 19 mmol/L — ABNORMAL LOW (ref 22–32)
Calcium: 11.4 mg/dL — ABNORMAL HIGH (ref 8.9–10.3)
Chloride: 111 mmol/L (ref 98–111)
Creatinine, Ser: 2.76 mg/dL — ABNORMAL HIGH (ref 0.44–1.00)
GFR, Estimated: 19 mL/min — ABNORMAL LOW (ref >60)
Glucose, Bld: 123 mg/dL — ABNORMAL HIGH (ref 70–99)
Potassium: 3.1 mmol/L — ABNORMAL LOW (ref 3.5–5.1)
Sodium: 136 mmol/L (ref 135–145)

## 2022-07-27 LAB — COMPREHENSIVE METABOLIC PANEL
ALT: 24 U/L (ref 0–44)
ALT: 25 U/L (ref 0–44)
ALT: 22 U/L (ref 0–44)
AST: 28 U/L (ref 15–41)
AST: 34 U/L (ref 15–41)
AST: 26 U/L (ref 15–41)
Albumin: 3.5 g/dL (ref 3.5–5.0)
Albumin: 3.5 g/dL (ref 3.5–5.0)
Albumin: 3.3 g/dL — ABNORMAL LOW (ref 3.5–5.0)
Alkaline Phosphatase: 149 U/L — ABNORMAL HIGH (ref 38–126)
Alkaline Phosphatase: 157 U/L — ABNORMAL HIGH (ref 38–126)
Alkaline Phosphatase: 136 U/L — ABNORMAL HIGH (ref 38–126)
Anion gap: 7 (ref 5–15)
Anion gap: 8 (ref 5–15)
Anion gap: 5 (ref 5–15)
BUN: 21 mg/dL — ABNORMAL HIGH (ref 6–20)
BUN: 22 mg/dL — ABNORMAL HIGH (ref 6–20)
BUN: 23 mg/dL — ABNORMAL HIGH (ref 6–20)
CO2: 24 mmol/L (ref 22–32)
CO2: 25 mmol/L (ref 22–32)
CO2: 23 mmol/L (ref 22–32)
Calcium: 13.1 mg/dL (ref 8.9–10.3)
Calcium: 13.3 mg/dL (ref 8.9–10.3)
Calcium: 12 mg/dL — ABNORMAL HIGH (ref 8.9–10.3)
Chloride: 103 mmol/L (ref 98–111)
Chloride: 103 mmol/L (ref 98–111)
Chloride: 107 mmol/L (ref 98–111)
Creatinine, Ser: 2.86 mg/dL — ABNORMAL HIGH (ref 0.44–1.00)
Creatinine, Ser: 2.84 mg/dL — ABNORMAL HIGH (ref 0.44–1.00)
Creatinine, Ser: 2.74 mg/dL — ABNORMAL HIGH (ref 0.44–1.00)
GFR, Estimated: 18 mL/min — ABNORMAL LOW (ref >60)
GFR, Estimated: 18 mL/min — ABNORMAL LOW (ref >60)
GFR, Estimated: 19 mL/min — ABNORMAL LOW (ref >60)
Glucose, Bld: 106 mg/dL — ABNORMAL HIGH (ref 70–99)
Glucose, Bld: 115 mg/dL — ABNORMAL HIGH (ref 70–99)
Glucose, Bld: 85 mg/dL (ref 70–99)
Potassium: 2.6 mmol/L — CL (ref 3.5–5.1)
Potassium: 2.7 mmol/L — CL (ref 3.5–5.1)
Potassium: 2.8 mmol/L — ABNORMAL LOW (ref 3.5–5.1)
Sodium: 134 mmol/L — ABNORMAL LOW (ref 135–145)
Sodium: 136 mmol/L (ref 135–145)
Sodium: 135 mmol/L (ref 135–145)
Total Bilirubin: 0.5 mg/dL (ref 0.3–1.2)
Total Bilirubin: 0.5 mg/dL (ref 0.3–1.2)
Total Bilirubin: 0.4 mg/dL (ref 0.3–1.2)
Total Protein: 8.1 g/dL (ref 6.5–8.1)
Total Protein: 8.5 g/dL — ABNORMAL HIGH (ref 6.5–8.1)
Total Protein: 7.3 g/dL (ref 6.5–8.1)

## 2022-07-27 LAB — CBC WITH DIFFERENTIAL/PLATELET
Abs Immature Granulocytes: 0.05 10*3/uL (ref 0.00–0.07)
Abs Immature Granulocytes: 0.04 10*3/uL (ref 0.00–0.07)
Basophils Absolute: 0.1 10*3/uL (ref 0.0–0.1)
Basophils Absolute: 0.1 10*3/uL (ref 0.0–0.1)
Basophils Relative: 1 %
Basophils Relative: 1 %
Eosinophils Absolute: 0.3 10*3/uL (ref 0.0–0.5)
Eosinophils Absolute: 0.2 10*3/uL (ref 0.0–0.5)
Eosinophils Relative: 2 %
Eosinophils Relative: 2 %
HCT: 37.4 % (ref 36.0–46.0)
HCT: 37.3 % (ref 36.0–46.0)
Hemoglobin: 12.6 g/dL (ref 12.0–15.0)
Hemoglobin: 12.6 g/dL (ref 12.0–15.0)
Immature Granulocytes: 0 %
Immature Granulocytes: 0 %
Lymphocytes Relative: 12 %
Lymphocytes Relative: 11 %
Lymphs Abs: 1.7 10*3/uL (ref 0.7–4.0)
Lymphs Abs: 1.2 10*3/uL (ref 0.7–4.0)
MCH: 28.5 pg (ref 26.0–34.0)
MCH: 28.4 pg (ref 26.0–34.0)
MCHC: 33.7 g/dL (ref 30.0–36.0)
MCHC: 33.8 g/dL (ref 30.0–36.0)
MCV: 84.6 fL (ref 80.0–100.0)
MCV: 84 fL (ref 80.0–100.0)
Monocytes Absolute: 1.4 10*3/uL — ABNORMAL HIGH (ref 0.1–1.0)
Monocytes Absolute: 1.2 10*3/uL — ABNORMAL HIGH (ref 0.1–1.0)
Monocytes Relative: 10 %
Monocytes Relative: 10 %
Neutro Abs: 10.3 10*3/uL — ABNORMAL HIGH (ref 1.7–7.7)
Neutro Abs: 8.5 10*3/uL — ABNORMAL HIGH (ref 1.7–7.7)
Neutrophils Relative %: 75 %
Neutrophils Relative %: 76 %
Platelets: 490 10*3/uL — ABNORMAL HIGH (ref 150–400)
Platelets: 474 10*3/uL — ABNORMAL HIGH (ref 150–400)
RBC: 4.42 MIL/uL (ref 3.87–5.11)
RBC: 4.44 MIL/uL (ref 3.87–5.11)
RDW: 15.2 % (ref 11.5–15.5)
RDW: 15.3 % (ref 11.5–15.5)
WBC: 13.8 10*3/uL — ABNORMAL HIGH (ref 4.0–10.5)
WBC: 11.2 10*3/uL — ABNORMAL HIGH (ref 4.0–10.5)
nRBC: 0 % (ref 0.0–0.2)
nRBC: 0 % (ref 0.0–0.2)

## 2022-07-27 LAB — FERRITIN: Ferritin: 246 ng/mL (ref 11–307)

## 2022-07-27 LAB — IRON AND TIBC
Iron: 48 ug/dL (ref 28–170)
Saturation Ratios: 19 % (ref 10.4–31.8)
TIBC: 246 ug/dL — ABNORMAL LOW (ref 250–450)
UIBC: 198 ug/dL

## 2022-07-27 LAB — PHOSPHORUS: Phosphorus: 2.5 mg/dL (ref 2.5–4.6)

## 2022-07-27 LAB — MRSA NEXT GEN BY PCR, NASAL: MRSA by PCR Next Gen: NOT DETECTED

## 2022-07-27 LAB — LACTATE DEHYDROGENASE: LDH: 98 U/L (ref 98–192)

## 2022-07-27 MED ORDER — PNEUMOCOCCAL 20-VAL CONJ VACC 0.5 ML IM SUSY: 0.5000 mL | PREFILLED_SYRINGE | INTRAMUSCULAR | Status: DC

## 2022-07-27 MED ORDER — AMLODIPINE BESYLATE 5 MG PO TABS
10.0000 mg | ORAL_TABLET | Freq: Every day | ORAL | Status: DC
  Administered 2022-07-28 – 2022-07-30 (×3): 10 mg via ORAL
  Filled 2022-07-27 (×3): qty 2

## 2022-07-27 MED ORDER — IMIPRAMINE HCL 25 MG PO TABS
50.0000 mg | ORAL_TABLET | Freq: Every day | ORAL | Status: DC
  Administered 2022-07-27 – 2022-07-29 (×3): 50 mg via ORAL
  Filled 2022-07-27: qty 1
  Filled 2022-07-27: qty 2
  Filled 2022-07-27 (×3): qty 1
  Filled 2022-07-27: qty 2

## 2022-07-27 MED ORDER — POTASSIUM CHLORIDE 10 MEQ/100ML IV SOLN
10.0000 meq | INTRAVENOUS | Status: AC
  Administered 2022-07-27 (×4): 10 meq via INTRAVENOUS
  Filled 2022-07-27 (×4): qty 100

## 2022-07-27 MED ORDER — POTASSIUM CHLORIDE 20 MEQ PO PACK
40.0000 meq | PACK | Freq: Once | ORAL | Status: AC
  Administered 2022-07-27: 40 meq via ORAL
  Filled 2022-07-27: qty 2

## 2022-07-27 MED ORDER — SIMVASTATIN 20 MG PO TABS
20.0000 mg | ORAL_TABLET | Freq: Every day | ORAL | Status: DC
  Administered 2022-07-28 – 2022-07-29 (×2): 20 mg via ORAL
  Filled 2022-07-27 (×2): qty 1

## 2022-07-27 MED ORDER — ROPINIROLE HCL 0.25 MG PO TABS
0.5000 mg | ORAL_TABLET | Freq: Every day | ORAL | Status: DC
  Administered 2022-07-27 – 2022-07-29 (×3): 0.5 mg via ORAL
  Filled 2022-07-27 (×3): qty 2

## 2022-07-27 MED ORDER — SODIUM CHLORIDE 0.9 % IV BOLUS
2000.0000 mL | Freq: Once | INTRAVENOUS | Status: AC
  Administered 2022-07-27: 2000 mL via INTRAVENOUS

## 2022-07-27 MED ORDER — SODIUM CHLORIDE 0.9 % IV SOLN: INTRAVENOUS | Status: DC

## 2022-07-27 MED ORDER — METOPROLOL TARTRATE 25 MG PO TABS
25.0000 mg | ORAL_TABLET | Freq: Two times a day (BID) | ORAL | Status: DC
  Administered 2022-07-27 – 2022-07-30 (×6): 25 mg via ORAL
  Filled 2022-07-27 (×6): qty 1

## 2022-07-27 MED ORDER — POTASSIUM CHLORIDE CRYS ER 20 MEQ PO TBCR
40.0000 meq | EXTENDED_RELEASE_TABLET | Freq: Four times a day (QID) | ORAL | Status: AC
  Administered 2022-07-27 – 2022-07-28 (×2): 40 meq via ORAL
  Filled 2022-07-27 (×2): qty 2

## 2022-07-27 MED ORDER — HEPARIN SODIUM (PORCINE) 5000 UNIT/ML IJ SOLN
5000.0000 [IU] | Freq: Three times a day (TID) | INTRAMUSCULAR | Status: DC
  Administered 2022-07-27 – 2022-07-30 (×8): 5000 [IU] via SUBCUTANEOUS
  Filled 2022-07-27 (×8): qty 1

## 2022-07-27 MED ORDER — ZOLEDRONIC ACID 4 MG/100ML IV SOLN
4.0000 mg | Freq: Once | INTRAVENOUS | Status: AC
  Administered 2022-07-27: 4 mg via INTRAVENOUS
  Filled 2022-07-27: qty 100

## 2022-07-27 MED ORDER — INFLUENZA VAC SPLIT QUAD 0.5 ML IM SUSY
0.5000 mL | PREFILLED_SYRINGE | INTRAMUSCULAR | Status: AC
  Administered 2022-07-28: 0.5 mL via INTRAMUSCULAR
  Filled 2022-07-27: qty 0.5

## 2022-07-27 MED ORDER — POTASSIUM CHLORIDE 10 MEQ/100ML IV SOLN
INTRAVENOUS | Status: AC
  Filled 2022-07-27: qty 100

## 2022-07-27 MED ORDER — ACETAMINOPHEN 650 MG RE SUPP: 650.0000 mg | Freq: Four times a day (QID) | RECTAL | Status: DC | PRN

## 2022-07-27 MED ORDER — CHLORHEXIDINE GLUCONATE CLOTH 2 % EX PADS
6.0000 | MEDICATED_PAD | Freq: Every day | CUTANEOUS | Status: DC
  Administered 2022-07-28: 6 via TOPICAL

## 2022-07-27 MED ORDER — FUROSEMIDE 10 MG/ML IJ SOLN
20.0000 mg | Freq: Four times a day (QID) | INTRAMUSCULAR | Status: DC
  Administered 2022-07-27 – 2022-07-29 (×8): 20 mg via INTRAVENOUS
  Filled 2022-07-27 (×8): qty 2

## 2022-07-27 MED ORDER — ACETAMINOPHEN 325 MG PO TABS
650.0000 mg | ORAL_TABLET | Freq: Four times a day (QID) | ORAL | Status: DC | PRN
  Administered 2022-07-28 – 2022-07-29 (×3): 650 mg via ORAL
  Filled 2022-07-27 (×3): qty 2

## 2022-07-27 MED ORDER — POTASSIUM CHLORIDE 10 MEQ/100ML IV SOLN
10.0000 meq | INTRAVENOUS | Status: AC
  Administered 2022-07-27 (×3): 10 meq via INTRAVENOUS
  Filled 2022-07-27 (×2): qty 100

## 2022-07-27 MED ORDER — ADULT MULTIVITAMIN W/MINERALS CH
1.0000 | ORAL_TABLET | Freq: Every day | ORAL | Status: DC
  Administered 2022-07-28 – 2022-07-30 (×3): 1 via ORAL
  Filled 2022-07-27 (×8): qty 1

## 2022-07-27 MED ORDER — ALPRAZOLAM 1 MG PO TABS
1.0000 mg | ORAL_TABLET | Freq: Four times a day (QID) | ORAL | Status: DC | PRN
  Administered 2022-07-28 – 2022-07-30 (×5): 1 mg via ORAL
  Filled 2022-07-27: qty 2
  Filled 2022-07-27 (×3): qty 1
  Filled 2022-07-27: qty 2

## 2022-07-27 MED ORDER — FLUDEOXYGLUCOSE F - 18 (FDG) INJECTION
6.6500 | Freq: Once | INTRAVENOUS | Status: AC | PRN
  Administered 2022-07-27: 6.65 via INTRAVENOUS

## 2022-07-27 NOTE — ED Provider Notes (Signed)
[facility] EMERGENCY DEPARTMENT Provider Note   CSN: [id number] Arrival date & time: 07/27/22  1250     History  Chief Complaint  Patient presents with   Abnormal Labs    Rhena Glace is a 61 y.o. female with past medical history significant for hypertension, anxiety, high cholesterol, Bell's palsy, depression, hypercalcemia, hypokalemia, patient reports that she was being worked up in March, April for significant hypercalcemia, elevated kidney function, hypokalemia, she had a PET scan due to some lymphadenopathy in May which did not show obvious cancer to describe her abnormal electrolyte findings, she does have a family history of multiple myeloma, she also reports that she had recent positive Cologuard test and is scheduled for colonoscopy next week.  She did PET scan this morning which is still being read and processed, and the lab works drawn today showed new hypercalcemia and hypokalemia.  She was told to present to the emergency department.  Patient reports that she has had decreased appetite, some weight loss, and intermittent constipation but otherwise reports that she is felt the same way that she has overall for the last several months, no acute changes precipitating recent lab findings.  HPI     Home Medications Prior to Admission medications   Medication Sig Start Date End Date Taking? Authorizing Provider  acetaminophen (TYLENOL) 500 MG tablet Take 1,000 mg by mouth every 6 (six) hours as needed for moderate pain.   Yes [provider]  ALPRAZolam Duanne Moron) 1 MG tablet Take 1 mg by mouth 4 (four) times daily as needed for anxiety.  09/08/15  Yes [provider]  amLODipine (NORVASC) 10 MG tablet Take 1 tablet (10 mg total) by mouth daily. 01/20/22  Yes Little Ishikawa, MD  cetirizine (ZYRTEC) 10 MG tablet Take 10 mg by mouth daily as needed for allergies.   Yes [provider]  imipramine (TOFRANIL) 50 MG tablet Take 50 mg by mouth at bedtime.    Yes [provider]  linaclotide (LINZESS) 290 MCG CAPS capsule Take 290 mcg by mouth daily before breakfast.   Yes [provider]  metoprolol tartrate (LOPRESSOR) 25 MG tablet Take 1 tablet (25 mg total) by mouth 2 (two) times daily. 01/19/22  Yes Little Ishikawa, MD  Multiple Vitamin (MULTIVITAMIN) tablet Take 1 tablet by mouth daily.   Yes [provider]  Probiotic Product (PROBIOTIC PO) Take 1 capsule by mouth daily.   Yes [provider]  rOPINIRole (REQUIP) 0.5 MG tablet TAKE 1 TABLET(0.5 MG) BY MOUTH AT BEDTIME Patient taking differently: Take 0.5 mg by mouth at bedtime. 07/26/22  Yes Alvira Monday, FNP  simvastatin (ZOCOR) 20 MG tablet Take 20 mg by mouth daily in the afternoon.   Yes [provider]  triamcinolone cream (KENALOG) 0.1 % Apply 1 Application topically 2 (two) times daily. 06/30/22  Yes Lindell Spar, MD  Sod Picosulfate-Mag Ox-Cit Acd [facility]) 10-3.5-12 MG-GM -GM/175ML SOLN Take 1 kit by mouth as directed. Patient not taking: Reported on 07/27/2022 07/25/22   Eloise Harman, DO      Allergies    Doxycycline, Penicillins, Ciprofloxacin, Estrace [estradiol], Nitrofurantoin, Polymox [amoxicillin], and Progesterone    Review of Systems   Review of Systems  Constitutional:  Positive for appetite change, fatigue and unexpected weight change.  Gastrointestinal:  Positive for constipation.  All other systems reviewed and are negative.   Physical Exam Updated Vital Signs BP (!) 144/79   Pulse 78   Temp 98 F (36.7 C) (  Oral)   Resp 13   Ht 5' 8.5" (1.74 m)   Wt 57.8 kg   SpO2 100%   BMI 19.09 kg/m  Physical Exam Vitals and nursing note reviewed.  Constitutional:      General: She is not in acute distress.    Appearance: Normal appearance.  HENT:     Head: Normocephalic and atraumatic.     Mouth/Throat:     Mouth: Mucous membranes are dry.  Eyes:     General:        Right eye: No discharge.         Left eye: No discharge.  Cardiovascular:     Rate and Rhythm: Normal rate and regular rhythm.     Heart sounds: No murmur heard.    No friction rub. No gallop.  Pulmonary:     Effort: Pulmonary effort is normal.     Breath sounds: Normal breath sounds.  Abdominal:     General: Bowel sounds are normal.     Palpations: Abdomen is soft.     Comments: Minimal tenderness in the epigastric region, no rebound, rigidity, guarding, normal bowel sounds throughout.  Skin:    General: Skin is warm and dry.     Capillary Refill: Capillary refill takes less than 2 seconds.  Neurological:     Mental Status: She is alert and oriented to person, place, and time.  Psychiatric:        Mood and Affect: Mood normal.        Behavior: Behavior normal.     ED Results / Procedures / Treatments   Labs (all labs ordered are listed, but only abnormal results are displayed) Labs Reviewed  CBC WITH DIFFERENTIAL/PLATELET - Abnormal; Notable for the following components:      Result Value   WBC 11.2 (*)    Platelets 474 (*)    Neutro Abs 8.5 (*)    Monocytes Absolute 1.2 (*)    All other components within normal limits  COMPREHENSIVE METABOLIC PANEL - Abnormal; Notable for the following components:   Potassium 2.7 (*)    Glucose, Bld 115 (*)    BUN 22 (*)    Creatinine, Ser 2.84 (*)    Calcium 13.3 (*)    Total Protein 8.5 (*)    Alkaline Phosphatase 157 (*)    GFR, Estimated 18 (*)    All other components within normal limits  MRSA NEXT GEN BY PCR, NASAL  URINALYSIS, ROUTINE W REFLEX MICROSCOPIC  PARATHYROID HORMONE, INTACT (NO CA)  PTH-RELATED PEPTIDE  CALCIUM, URINE, RANDOM  PROTEIN ELECTROPHORESIS, SERUM  MULTIPLE MYELOMA PANEL, SERUM  BASIC METABOLIC PANEL  BASIC METABOLIC PANEL  BASIC METABOLIC PANEL  COMPREHENSIVE METABOLIC PANEL  RENAL FUNCTION PANEL  URINALYSIS, ROUTINE W REFLEX MICROSCOPIC  UPEP/UIFE/LIGHT CHAINS/TP, 24-HR UR  VITAMIN D 25 HYDROXY (VIT D DEFICIENCY, FRACTURES)   ANGIOTENSIN CONVERTING ENZYME  CALCIUM, IONIZED  BASIC METABOLIC PANEL  CBC    EKG None  Radiology DG Chest Port 1 View  Result Date: 07/27/2022 CLINICAL DATA:  Hypercalcemia, history of hematologic malignancy and lymphadenopathy EXAM: PORTABLE CHEST 1 VIEW COMPARISON:  01/14/2022 FINDINGS: Single frontal view of the chest demonstrates an unremarkable cardiac silhouette. No airspace disease, effusion, or pneumothorax. No acute bony abnormalities. IMPRESSION: 1. No acute intrathoracic process. Electronically Signed   By: Randa Ngo M.D.   On: 07/27/2022 17:24    Procedures .Critical Care  Performed by: Anselmo Pickler, PA-C Authorized by: Anselmo Pickler, PA-C   Critical  care provider statement:    Critical care time (minutes):  35   Critical care was necessary to treat or prevent imminent or life-threatening deterioration of the following conditions:  Metabolic crisis   Critical care was time spent personally by me on the following activities:  Development of treatment plan with patient or surrogate, discussions with consultants, evaluation of patient's response to treatment, examination of patient, ordering and review of laboratory studies, ordering and review of radiographic studies, ordering and performing treatments and interventions, pulse oximetry, re-evaluation of patient's condition and review of old charts   Care discussed with: admitting provider       Medications Ordered in ED Medications  potassium chloride 10 mEq in 100 mL IVPB (10 mEq Intravenous New Bag/Given 07/27/22 1710)  potassium chloride 10 mEq in 100 mL IVPB (has no administration in time range)  Zoledronic Acid (ZOMETA) IVPB 4 mg (has no administration in time range)  0.9 %  sodium chloride infusion ( Intravenous New Bag/Given 07/27/22 1734)  furosemide (LASIX) injection 20 mg (has no administration in time range)  heparin injection 5,000 Units (has no administration in time range)   acetaminophen (TYLENOL) tablet 650 mg (has no administration in time range)    Or  acetaminophen (TYLENOL) suppository 650 mg (has no administration in time range)  Chlorhexidine Gluconate Cloth 2 % PADS 6 each (has no administration in time range)  potassium chloride (KLOR-CON) packet 40 mEq (40 mEq Oral Given 07/27/22 1453)  sodium chloride 0.9 % bolus 2,000 mL (2,000 mLs Intravenous New Bag/Given 07/27/22 1539)    ED Course/ Medical Decision Making/ A&P                           Medical Decision Making  This patient is a 61 y.o. female who presents to the ED for concern of abnormal labs, lack of appetite, constipation, this involves an extensive number of treatment options, and is a complaint that carries with it a high risk of complications and morbidity. The emergent differential diagnosis prior to evaluation includes, but is not limited to, ongoing hypokalemia, hypercalcemia, malignancy, lung cancer, multiple myeloma, versus other.   This is not an exhaustive differential.   Past Medical History / Co-morbidities / Social History: hypertension, anxiety, high cholesterol, Bell's palsy, depression, hypercalcemia, hypokalemia  Additional history: Chart reviewed. Pertinent results include: Extensively reviewed previous hematology work-up, PET scans, and evaluation for Bell's palsy, hypercalcemia, hypokalemia over the last year, no evidence of clear diagnosis although she did have positive Cologuard, scheduled for colonoscopy, as well as some right-sided lymphadenopathy, however her previous PET scan did not show obvious malignancy  Physical Exam: Physical exam performed. The pertinent findings include: Patient appears somewhat dry on exam, but is in no acute distress, minimal tenderness palpation of the epigastric region, no rebound, rigidity, guarding  Lab Tests: I ordered, and personally interpreted labs.  The pertinent results include: Patient with significant hypercalcemia,  hypokalemia, potassium 2.7, calcium 13.3, additionally patient with significant elevation in her kidney function, creatinine of 2.84 from a baseline of around 1.2-1.4.  Elevated BUN from baseline, alk phos is similar to baseline.    Imaging Studies: I ordered imaging studies including plain film chest x-ray. I independently visualized and interpreted imaging which showed no acute intrathoracic abnormality. I agree with the radiologist interpretation.   Cardiac Monitoring:  The patient was maintained on a cardiac monitor.  My attending physician Dr. Matilde Sprang viewed and interpreted the cardiac monitored which  showed an underlying rhythm of: NSR. I agree with this interpretation.   Medications: I ordered medication including potassium chloride oral and IV, fluid bolus  for dehydration, hypokalemia.  Patient will need further work-up and evaluation in hospital setting  Consultations Obtained: I requested consultation with the hospitalist,  and discussed lab and imaging findings as well as pertinent plan - they recommend: Admission for AKI, hypercalcemia, hypokalemia   Disposition: After consideration of the diagnostic results and the patients response to treatment, I feel that patient would benefit from admission as discussed above.   I discussed this case with my attending physician Dr. Matilde Sprang who cosigned this note including patient's presenting symptoms, physical exam, and planned diagnostics and interventions. Attending physician stated agreement with plan or made changes to plan which were implemented.    Final Clinical Impression(s) / ED Diagnoses Final diagnoses:  None    Rx / DC Orders ED Discharge Orders     None         Dorien Chihuahua 07/27/22 1734    Milton Ferguson, MD 07/30/22 1144

## 2022-07-27 NOTE — Progress Notes (Unsigned)
CRITICAL VALUE ALERT Critical value received:  K+ 2.6, Calcium 13.1 Date of notification:  07-27-22 Time of notification: 1212 Critical value read back:  Yes.   Nurse who received alert:  C. Jernie Schutt RN MD notified time and response:  1212, Dr. Debe Coder ,instructed to tell patient to go directly to the emergency room. Pt is aware.

## 2022-07-27 NOTE — ED Triage Notes (Signed)
Patient reports she was seen at her PCP this morning and had labs drawn.  Patient reports they told her she had low potassium and high calcium.  Patient denies any symptoms.

## 2022-07-27 NOTE — H&P (Addendum)
[facility] H&P   Patient Demographics:    Leah Padilla, is a 61 y.o. female  MRN: [id number]   DOB - 1961-07-07  Admit Date - 07/27/2022  Outpatient Primary MD for the patient is Alvira Monday, FNP  Referring MD/NP/PA: PA Prosperi  Outpatient Specialists: oncology at [facility]  Patient coming from: home, sent from oncology office for abnormal labs  Chief Complaint  Patient presents with   Abnormal Labs      HPI:    Leah Padilla  is a 61 y.o. 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 -Patient does report nausea, generalized weakness, fatigue, lack of energy, poor appetite and oral intake, she had repeat PET scan done today, and routine labs showing significant labs abnormalities including hypercalcemia, hypokalemia, AKI for which she was instructed to come to ED. [name] labs in ED confirming hypokalemia with potassium of 2.7, creatinine of 2.84, calcium of 13.3, alk phos of 157, total protein of 8.5, albumin normal at 3.5, normal hemoglobin at 12.6, white blood cell of 12.2.  [facility] hospitalist consulted to admit.    Review of systems:   A full 10 point Review of Systems was done, except as stated above, all other Review of Systems were negative.   With Past History of the following :    Past Medical History:  Diagnosis Date   Anxiety    Bell's palsy    Depression    High cholesterol    Hypertension       Past Surgical History:  Procedure Laterality Date   AXILLARY LYMPH NODE BIOPSY Right 01/17/2022    Procedure: AXILLARY LYMPH NODE BIOPSY;  Surgeon: Rusty Aus, DO;  Location: [facility] ORS;  Service: General;  Laterality: Right;   COLONOSCOPY N/A 04/17/2013   Procedure: COLONOSCOPY;  Surgeon: Rogene Houston, MD;  Location: [facility] ENDO SUITE;  Service: Endoscopy;  Laterality: N/A;  930   CYST EXCISION     neck      Social History:     Social History   Tobacco Use   Smoking status: Former    Packs/day: 0.50    Years: 13.00    Total pack years: 6.50    Types: Cigarettes    Quit date: 12/13/2016    Years since quitting: 5.6   Smokeless tobacco: Never  Substance Use Topics   Alcohol use: Not Currently    Comment: occasional       Family History :     Family History  Problem Relation Age of Onset   Multiple myeloma Mother    Stroke Father    Breast cancer Sister  Stroke Other    Diabetes Other    Colon cancer Neg Hx      Home Medications:   Prior to Admission medications   Medication Sig Start Date End Date Taking? Authorizing Provider  acetaminophen (TYLENOL) 500 MG tablet Take 1,000 mg by mouth every 6 (six) hours as needed for moderate pain.    [provider]  ALPRAZolam Duanne Moron) 1 MG tablet Take 1 mg by mouth 4 (four) times daily as needed for anxiety.  09/08/15   [provider]  amLODipine (NORVASC) 10 MG tablet Take 1 tablet (10 mg total) by mouth daily. 01/20/22   Little Ishikawa, MD  cetirizine (ZYRTEC) 10 MG tablet Take 10 mg by mouth daily as needed for allergies.    [provider]  fluticasone (FLONASE) 50 MCG/ACT nasal spray Place 2 sprays into both nostrils daily. Patient taking differently: Place 2 sprays into both nostrils daily as needed for allergies. 06/13/22   Alvira Monday, FNP  imipramine (TOFRANIL) 50 MG tablet Take 50 mg by mouth at bedtime.    [provider]  linaclotide (LINZESS) 290 MCG CAPS capsule Take 290 mcg by mouth daily before breakfast.    [provider]  metoprolol tartrate  (LOPRESSOR) 25 MG tablet Take 1 tablet (25 mg total) by mouth 2 (two) times daily. 01/19/22   Little Ishikawa, MD  Multiple Vitamin (MULTIVITAMIN) tablet Take 1 tablet by mouth daily.    [provider]  Probiotic Product (PROBIOTIC PO) Take 1 capsule by mouth daily.    [provider]  rOPINIRole (REQUIP) 0.5 MG tablet TAKE 1 TABLET(0.5 MG) BY MOUTH AT BEDTIME 07/26/22   Alvira Monday, FNP  simvastatin (ZOCOR) 20 MG tablet Take 20 mg by mouth daily in the afternoon.    [provider]  Sod Picosulfate-Mag Ox-Cit Acd (CLENPIQ) 10-3.5-12 MG-GM -GM/175ML SOLN Take 1 kit by mouth as directed. 07/25/22   Eloise Harman, DO  triamcinolone cream (KENALOG) 0.1 % Apply 1 Application topically 2 (two) times daily. Patient taking differently: Apply 1 Application topically 2 (two) times daily as needed (rash). 06/30/22   Lindell Spar, MD     Allergies:     Allergies  Allergen Reactions   Doxycycline Nausea Only    Respiratory Distress, nausea, loss of appetite   Penicillins Hives and Nausea Only   Ciprofloxacin Other (See Comments)    Dry mouth, skin tingling, panicky, stomach pain, anxious and nervous    Estrace [Estradiol]     Unknown reaction   Nitrofurantoin Other (See Comments)    Chills and hot flashes    Polymox [Amoxicillin] Hives   Progesterone     Burning / vaginal discharge      Physical Exam:   Vitals  Blood pressure (!) 144/79, pulse 78, temperature 98 F (36.7 C), temperature source Oral, resp. rate 13, height 5' 8.5" (1.74 m), weight 57.8 kg, SpO2 100 %.   1. General female, laying in bed, no apparent distress  2. Normal affect and insight, Not Suicidal or Homicidal, Awake Alert, Oriented X 3.  3. No F.N deficits, ALL C.Nerves Intact, Strength 5/5 all 4 extremities, Sensation intact all 4 extremities, Plantars down going.  4. Ears and Eyes appear Normal, Conjunctivae clear, PERRLA. Moist Oral Mucosa.  5. Supple Neck, No JVD, No  cervical lymphadenopathy appriciated, No Carotid Bruits.  6. Symmetrical Chest wall movement, Good air movement bilaterally, CTAB.  7. RRR, No Gallops, Rubs or Murmurs, No Parasternal Heave.  8.  Positive Bowel Sounds, Abdomen Soft, No tenderness, No organomegaly appriciated,No rebound -guarding or rigidity.  9.  No Cyanosis, good skin turgor, she is with maculopapular rash and back and lower abdomen, please see picture below  10. Good muscle tone,  joints appear normal , no effusions, Normal ROM.      Data Review:    CBC Recent Labs  Lab 07/27/22 1118 07/27/22 1410  WBC 13.8* 11.2*  HGB 12.6 12.6  HCT 37.4 37.3  PLT 490* 474*  MCV 84.6 84.0  MCH 28.5 28.4  MCHC 33.7 33.8  RDW 15.2 15.3  LYMPHSABS 1.7 1.2  MONOABS 1.4* 1.2*  EOSABS 0.3 0.2  BASOSABS 0.1 0.1   ------------------------------------------------------------------------------------------------------------------  Chemistries  Recent Labs  Lab 07/27/22 1118 07/27/22 1410  NA 134* 136  K 2.6* 2.7*  CL 103 103  CO2 24 25  GLUCOSE 106* 115*  BUN 21* 22*  CREATININE 2.86* 2.84*  CALCIUM 13.1* 13.3*  AST 28 34  ALT 24 25  ALKPHOS 149* 157*  BILITOT 0.5 0.5   ------------------------------------------------------------------------------------------------------------------ estimated creatinine clearance is 19.2 mL/min (A) (by C-G formula based on SCr of 2.84 mg/dL (H)). ------------------------------------------------------------------------------------------------------------------ No results for input(s): "TSH", "T4TOTAL", "T3FREE", "THYROIDAB" in the last 72 hours.  Invalid input(s): "FREET3"  Coagulation profile No results for input(s): "INR", "PROTIME" in the last 168 hours. ------------------------------------------------------------------------------------------------------------------- No results for input(s): "DDIMER" in the last 72  hours. -------------------------------------------------------------------------------------------------------------------  Cardiac Enzymes No results for input(s): "CKMB", "TROPONINI", "MYOGLOBIN" in the last 168 hours.  Invalid input(s): "CK" ------------------------------------------------------------------------------------------------------------------ No results found for: "BNP"   ---------------------------------------------------------------------------------------------------------------  Urinalysis    Component Value Date/Time   COLORURINE YELLOW 03/23/2022 1102   APPEARANCEUR Cloudy (A) 04/14/2022 1330   LABSPEC 1.012 03/23/2022 1102   PHURINE 6.0 03/23/2022 1102   GLUCOSEU Negative 04/14/2022 1330   HGBUR SMALL (A) 03/23/2022 1102   BILIRUBINUR Negative 04/14/2022 1330   KETONESUR NEGATIVE 03/23/2022 1102   PROTEINUR 1+ (A) 04/14/2022 1330   PROTEINUR Positive (A) 04/14/2022 0939   PROTEINUR 30 (A) 03/23/2022 1102   NITRITE Positive (A) 04/14/2022 1330   NITRITE neg 04/14/2022 0939   NITRITE POSITIVE (A) 03/23/2022 1102   LEUKOCYTESUR 2+ (A) 04/14/2022 1330   LEUKOCYTESUR Trace (A) 04/14/2022 0939   LEUKOCYTESUR LARGE (A) 03/23/2022 1102    ----------------------------------------------------------------------------------------------------------------   Imaging Results:    No results found.  My personal review of EKG: Rhythm NSR, Rate  96 /min, QTc 421 , no Acute ST changes   Assessment & Plan:    Principal Problem:   AKI (acute kidney injury) ([place]) Active Problems:   Hypercalcemia   Hypokalemia   Essential hypertension   Lymphadenopathy, generalized  Hypercalcemia -Symptomatic, as she does report generalized weakness and fatigue and poor appetite. -No clear etiology, extensive work-up in the past including PET scan significant for generalized lymphadenopathy, lymph node biopsy with noncaseating granuloma, vitamin D25, vitamin D1, 25, within normal  limit, and by reviewing most recent discharge summary in April of this year her work-up including SPEP, ACE level, LDH, kappa/lambda light chains, hepatitis C and B without significant abnormalities. -We will recheck multiple myeloma panel, TSH, PTH related peptide, PTH, urinary calcium ,angiotensin-converting enzyme and vitamin D -We will give IV zoledronic acid 4 mg once Continue with aggressive IV fluid hydration, will start on low-dose Lasix meanwhile and will admit to stepdown. -Calcium level every 4 hours. -He had PET scan done today, she does report weight loss, feeling febrile at nighttime, but she denies any sweating,  will request oncology consult.  -Albumin to protein ratio is elevated, will check multiple myeloma panel, SPEP/UPEP, she does endorse history of multiple myeloma in her mother.  AKI -Treated with aggressive fluid hydration, avoid nephrotoxic medication, will check renal ultrasound, and will consult renal  Hypokalemia -Repleted, will monitor closely  Hypertension -Continue with home medications  Hyperlipidemia -Continue with statin     DVT Prophylaxis Heparin   AM Labs Ordered, also please review Full Orders  Family Communication: Admission, patients condition and plan of care including tests being ordered have been discussed with the patient who indicate understanding and agree with the plan and Code Status.  Code Status Full  Likely DC to  home  Condition GUARDED    Consults called: renal and oncology requested in EPIC    Admission status: inpatient    Time spent in minutes : 70 minutes   Phillips Climes M.D on 07/27/2022 at 4:51 PM   [facility] - Office  [phone]

## 2022-07-28 ENCOUNTER — Inpatient Hospital Stay (HOSPITAL_COMMUNITY): Payer: 59

## 2022-07-28 DIAGNOSIS — N179 Acute kidney failure, unspecified: Secondary | ICD-10-CM | POA: Diagnosis not present

## 2022-07-28 DIAGNOSIS — N1832 Chronic kidney disease, stage 3b: Secondary | ICD-10-CM | POA: Diagnosis not present

## 2022-07-28 DIAGNOSIS — I10 Essential (primary) hypertension: Secondary | ICD-10-CM | POA: Diagnosis not present

## 2022-07-28 LAB — BASIC METABOLIC PANEL
Anion gap: 4 — ABNORMAL LOW (ref 5–15)
Anion gap: 4 — ABNORMAL LOW (ref 5–15)
Anion gap: 5 (ref 5–15)
Anion gap: 6 (ref 5–15)
BUN: 16 mg/dL (ref 6–20)
BUN: 17 mg/dL (ref 6–20)
BUN: 18 mg/dL (ref 6–20)
BUN: 20 mg/dL (ref 6–20)
CO2: 18 mmol/L — ABNORMAL LOW (ref 22–32)
CO2: 18 mmol/L — ABNORMAL LOW (ref 22–32)
CO2: 19 mmol/L — ABNORMAL LOW (ref 22–32)
CO2: 20 mmol/L — ABNORMAL LOW (ref 22–32)
Calcium: 10 mg/dL (ref 8.9–10.3)
Calcium: 10.2 mg/dL (ref 8.9–10.3)
Calcium: 10.3 mg/dL (ref 8.9–10.3)
Calcium: 10.5 mg/dL — ABNORMAL HIGH (ref 8.9–10.3)
Chloride: 112 mmol/L — ABNORMAL HIGH (ref 98–111)
Chloride: 113 mmol/L — ABNORMAL HIGH (ref 98–111)
Chloride: 114 mmol/L — ABNORMAL HIGH (ref 98–111)
Chloride: 116 mmol/L — ABNORMAL HIGH (ref 98–111)
Creatinine, Ser: 2.18 mg/dL — ABNORMAL HIGH (ref 0.44–1.00)
Creatinine, Ser: 2.26 mg/dL — ABNORMAL HIGH (ref 0.44–1.00)
Creatinine, Ser: 2.32 mg/dL — ABNORMAL HIGH (ref 0.44–1.00)
Creatinine, Ser: 2.56 mg/dL — ABNORMAL HIGH (ref 0.44–1.00)
GFR, Estimated: 21 mL/min — ABNORMAL LOW (ref >60)
GFR, Estimated: 23 mL/min — ABNORMAL LOW (ref >60)
GFR, Estimated: 24 mL/min — ABNORMAL LOW (ref >60)
GFR, Estimated: 25 mL/min — ABNORMAL LOW (ref >60)
Glucose, Bld: 100 mg/dL — ABNORMAL HIGH (ref 70–99)
Glucose, Bld: 88 mg/dL (ref 70–99)
Glucose, Bld: 91 mg/dL (ref 70–99)
Glucose, Bld: 94 mg/dL (ref 70–99)
Potassium: 3.1 mmol/L — ABNORMAL LOW (ref 3.5–5.1)
Potassium: 3.5 mmol/L (ref 3.5–5.1)
Potassium: 3.6 mmol/L (ref 3.5–5.1)
Potassium: 3.8 mmol/L (ref 3.5–5.1)
Sodium: 136 mmol/L (ref 135–145)
Sodium: 137 mmol/L (ref 135–145)
Sodium: 138 mmol/L (ref 135–145)
Sodium: 138 mmol/L (ref 135–145)

## 2022-07-28 LAB — URINALYSIS, ROUTINE W REFLEX MICROSCOPIC
Bacteria, UA: NONE SEEN
Bilirubin Urine: NEGATIVE
Glucose, UA: 50 mg/dL — AB
Ketones, ur: NEGATIVE mg/dL
Leukocytes,Ua: NEGATIVE
Nitrite: NEGATIVE
Protein, ur: NEGATIVE mg/dL
Specific Gravity, Urine: 1.004 — ABNORMAL LOW (ref 1.005–1.030)
pH: 6 (ref 5.0–8.0)

## 2022-07-28 LAB — PARATHYROID HORMONE, INTACT (NO CA): PTH: 11 pg/mL — ABNORMAL LOW (ref 15–65)

## 2022-07-28 LAB — CALCIUM, IONIZED: Calcium, Ionized, Serum: 7.8 mg/dL — ABNORMAL HIGH (ref 4.5–5.6)

## 2022-07-28 LAB — CALCIUM
Calcium: 9.9 mg/dL (ref 8.9–10.3)
Calcium: 9.6 mg/dL (ref 8.9–10.3)

## 2022-07-28 LAB — VITAMIN D 25 HYDROXY (VIT D DEFICIENCY, FRACTURES): Vit D, 25-Hydroxy: 36.05 ng/mL (ref 30–100)

## 2022-07-28 NOTE — Progress Notes (Signed)
  Transition of Care [facility]) Screening Note   Patient Details  Name: Leah Padilla Date of Birth: 03-30-1961   Transition of Care [facility]) CM/SW Contact:    Iona Beard, [place] Phone Number: 07/28/2022, 11:16 AM    Transition of Care Department [facility]) has reviewed patient and no TOC needs have been identified at this time. We will continue to monitor patient advancement through interdisciplinary progression rounds. If new patient transition needs arise, please place a TOC consult.

## 2022-07-28 NOTE — Progress Notes (Signed)
Report called to [facility], pt transported to rm [id number] via tele/wheelchair, with daughter in tow and all pt belongings, 24 hour urine collection complete.

## 2022-07-28 NOTE — Progress Notes (Signed)
  Progress Note   Patient: Leah Padilla WUJ:[id number] DOB: May 03, 1961 DOA: 07/27/2022     1 DOS: the patient was seen and examined on 07/28/2022   Brief hospital admission course: As per H&P written by Dr. Waldron Labs on 07/27/2022 Leah Padilla  is a 61 y.o. 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 -Patient does report nausea, generalized weakness, fatigue, lack of energy, poor appetite and oral intake, she had repeat PET scan done today, and routine labs showing significant labs abnormalities including hypercalcemia, hypokalemia, AKI for which she was instructed to come to ED. Leah Padilla labs in ED confirming hypokalemia with potassium of 2.7, creatinine of 2.84, calcium of 13.3, alk phos of 157, total protein of 8.5, albumin normal at 3.5, normal hemoglobin at 12.6, white blood cell of 12.2.  [facility] consulted to admit.  Assessment and Plan: 1-hypercalcemia -Improve after receiving zoledronic acid, IV fluids and furosemide -Calcium 10.5 currently. -Continue to follow pending results for multiple myeloma, SPEP/UPEP, and PTH -To maintain adequate hydration and follow electrolytes trend.  2-acute kidney injury on chronic kidney disease stage IIIb -In the setting of prerenal azotemia and dehydration -Renal ultrasound unremarkable except for small nonobstructive calculi -Renal function improved and pretty much back to normal after fluid resuscitation -Continue to follow trend. -Patient creatinine around 2 from 6 months ago.  GFR in the 40s-50 range.  3-hypokalemia -Repleted and within normal limits currently -Continue  telemetry monitoring and follow electrolytes trend.  4-hypertension -Stable overall -Continue current antihypertensive agents.  5-hyperlipidemia -Continue statin.  Subjective:  Feeling better and just complaining of generalized weakness -No chest pain, no shortness of breath, no nausea, no vomiting, no dysuria, no fever.  Physical Exam: Vitals:   07/28/22 1100 07/28/22 1130 07/28/22 1200 07/28/22 1300  BP: (!) 142/82  (!) 146/83 137/79  Pulse: 71  74 72  Resp: _0 Temp:  98.1 F (36.7 C)    TempSrc:  Oral    SpO2: 100%  100% 100%  Weight:      Height:       General exam: Alert, awake, oriented x 3 Respiratory system: Clear to auscultation. Respiratory effort normal. Cardiovascular system:RRR. No murmurs, rubs, gallops. Gastrointestinal system: Abdomen is nondistended, soft and nontender. No organomegaly or masses felt. Normal bowel sounds heard. Central nervous system: Alert and oriented. No focal neurological deficits. Extremities: No C/C/E, +pedal pulses Skin: Mild maculopapular rash appreciated at time of admission fading away.  No open wounds. Psychiatry: Judgement and insight appear normal. Mood & affect appropriate.   Data Reviewed: Sodium 138, potassium 3.8, chloride 116, bicarb 18, glucose 94, BUN 16 and creatinine 2.3 CBC WBC 7.6, hemoglobin 9.8 and platelet count 363k Calcium level 10.2  Family Communication: No family at bedside.  Disposition: Status is: Inpatient Remains inpatient appropriate because: Still requiring IV therapy for hypercalcemia and acute on chronic renal failure.   Planned Discharge Destination: Home   Author: Barton Dubois, MD 07/28/2022 4:08 PM  For on call review [email or web address].

## 2022-07-29 DIAGNOSIS — N179 Acute kidney failure, unspecified: Secondary | ICD-10-CM | POA: Diagnosis not present

## 2022-07-29 DIAGNOSIS — I10 Essential (primary) hypertension: Secondary | ICD-10-CM | POA: Diagnosis not present

## 2022-07-29 DIAGNOSIS — F419 Anxiety disorder, unspecified: Secondary | ICD-10-CM

## 2022-07-29 DIAGNOSIS — E876 Hypokalemia: Secondary | ICD-10-CM | POA: Diagnosis not present

## 2022-07-29 LAB — CALCIUM
Calcium: 8.8 mg/dL — ABNORMAL LOW (ref 8.9–10.3)
Calcium: 9.3 mg/dL (ref 8.9–10.3)
Calcium: 9.3 mg/dL (ref 8.9–10.3)
Calcium: 9.6 mg/dL (ref 8.9–10.3)

## 2022-07-29 LAB — CALCIUM, URINE, RANDOM: Calcium, Ur: 10.8 mg/dL

## 2022-07-29 MED ORDER — SODIUM CHLORIDE 0.9 % IV SOLN: INTRAVENOUS | Status: AC

## 2022-07-29 NOTE — Progress Notes (Signed)
Progress Note   Patient: Leah Padilla OER:[id number] DOB: 1960/11/27 DOA: 07/27/2022     2 DOS: the patient was seen and examined on 07/29/2022   Brief hospital admission course: As per H&P written by Dr. Waldron Labs on 07/27/2022 Leah Padilla  is a 61 y.o. 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 -Patient does report nausea, generalized weakness, fatigue, lack of energy, poor appetite and oral intake, she had repeat PET scan done today, and routine labs showing significant labs abnormalities including hypercalcemia, hypokalemia, AKI for which she was instructed to come to ED. Leah Padilla labs in ED confirming hypokalemia with potassium of 2.7, creatinine of 2.84, calcium of 13.3, alk phos of 157, total protein of 8.5, albumin normal at 3.5, normal hemoglobin at 12.6, white blood cell of 12.2.  [facility] hospitalist consulted to admit.  Assessment and Plan: 1-hypercalcemia -Improved/resolved after receiving zoledronic acid, IV fluids and furosemide. -Calcium 8.8 currently. -Stopping diuretics and adjusting fluid resuscitation rate. -Continue to follow pending results for multiple myeloma, SPEP/UPEP, and PTH (pending). -To maintain adequate hydration and follow electrolytes trend.  2-acute kidney injury on chronic kidney disease stage IIIb -In the setting of prerenal azotemia and dehydration -Renal ultrasound unremarkable except for small nonobstructive calculi -Renal function improved and pretty much back to normal after fluid resuscitation -Continue to follow trend/stability. -Repeating basic metabolic panel in AM. -Patient  creatinine around 2 from 6 months ago.  GFR in the 40s-50 range.  3-hypokalemia -Repleted and within normal limits currently. -Continue to follow electrolytes trend. -Continue telemetry.  4-hypertension -Stable overall. -Continue current antihypertensive agents.  5-hyperlipidemia -Continue statin. -Heart healthy diet discussed with patient.  6-anxiety -Continue the use of Tofranil.  Subjective:  Patient reports feeling better; no chest pain, no nausea, no vomiting, no dysuria and no focal deficit.  Still generally weak; expressing good urine output and just slightly anxious.  Physical Exam: Vitals:   07/29/22 0059 07/29/22 0456 07/29/22 0830 07/29/22 1417  BP: 120/74 112/70 138/83 136/75  Pulse: 78 88 98 83  Resp: _0 Temp: 98.8 F (37.1 C) 98.6 F (37 C) 97.9 F (36.6 C) 98.1 F (36.7 C)  TempSrc:   Oral Oral  SpO2: 98% 100% 100% 100%  Weight:      Height:       General exam: Alert, awake, oriented x 3; reporting still feeling weak but improved.  No chest pain, no nausea, no vomiting.  Would like her diet liberalized. Respiratory system: Clear to auscultation. Respiratory effort normal.  Good saturation on room air. Cardiovascular system:RRR. No rubs or gallops. Gastrointestinal system: Abdomen is nondistended, soft and nontender. No organomegaly or masses felt. Normal bowel sounds heard. Central nervous system: Alert and oriented. No focal neurological deficits. Extremities: No cyanosis or clubbing.  No edema. Skin: No petechiae. Psychiatry: Judgement and insight appear normal. Mood & affect appropriate.   Data Reviewed: Last basic metabolic panel: Sodium 388, potassium 3.8, chloride 116, bicarb 18, glucose 94, BUN 16 and creatinine 2.3 Last CBC: WBC 7.6, hemoglobin 9.8 and platelet count 363k Calcium level: 8.8   Family Communication: No family at bedside.  Disposition: Status is: Inpatient Remains inpatient appropriate because: Still requiring IV  therapy for  hypercalcemia and acute on chronic renal failure.   Planned Discharge Destination: Home  Time: 35 minutes  Author: Barton Dubois, MD 07/29/2022 2:42 PM  For on call review [email or web address].

## 2022-07-30 DIAGNOSIS — I10 Essential (primary) hypertension: Secondary | ICD-10-CM | POA: Diagnosis not present

## 2022-07-30 DIAGNOSIS — N179 Acute kidney failure, unspecified: Secondary | ICD-10-CM | POA: Diagnosis not present

## 2022-07-30 DIAGNOSIS — N1832 Chronic kidney disease, stage 3b: Secondary | ICD-10-CM

## 2022-07-30 DIAGNOSIS — E876 Hypokalemia: Secondary | ICD-10-CM | POA: Diagnosis not present

## 2022-07-30 LAB — CBC
HCT: 30.4 % — ABNORMAL LOW (ref 36.0–46.0)
Hemoglobin: 9.9 g/dL — ABNORMAL LOW (ref 12.0–15.0)
MCH: 28.7 pg (ref 26.0–34.0)
MCHC: 32.6 g/dL (ref 30.0–36.0)
MCV: 88.1 fL (ref 80.0–100.0)
Platelets: 357 10*3/uL (ref 150–400)
RBC: 3.45 MIL/uL — ABNORMAL LOW (ref 3.87–5.11)
RDW: 16.2 % — ABNORMAL HIGH (ref 11.5–15.5)
WBC: 8.5 10*3/uL (ref 4.0–10.5)
nRBC: 0 % (ref 0.0–0.2)

## 2022-07-30 LAB — COMPREHENSIVE METABOLIC PANEL
ALT: 25 U/L (ref 0–44)
AST: 28 U/L (ref 15–41)
Albumin: 2.7 g/dL — ABNORMAL LOW (ref 3.5–5.0)
Alkaline Phosphatase: 123 U/L (ref 38–126)
Anion gap: 4 — ABNORMAL LOW (ref 5–15)
BUN: 11 mg/dL (ref 6–20)
CO2: 17 mmol/L — ABNORMAL LOW (ref 22–32)
Calcium: 8.5 mg/dL — ABNORMAL LOW (ref 8.9–10.3)
Chloride: 119 mmol/L — ABNORMAL HIGH (ref 98–111)
Creatinine, Ser: 1.95 mg/dL — ABNORMAL HIGH (ref 0.44–1.00)
GFR, Estimated: 29 mL/min — ABNORMAL LOW (ref >60)
Glucose, Bld: 81 mg/dL (ref 70–99)
Potassium: 3 mmol/L — ABNORMAL LOW (ref 3.5–5.1)
Sodium: 140 mmol/L (ref 135–145)
Total Bilirubin: 0.3 mg/dL (ref 0.3–1.2)
Total Protein: 6.2 g/dL — ABNORMAL LOW (ref 6.5–8.1)

## 2022-07-30 MED ORDER — POTASSIUM CHLORIDE CRYS ER 20 MEQ PO TBCR
40.0000 meq | EXTENDED_RELEASE_TABLET | Freq: Once | ORAL | Status: AC
  Administered 2022-07-30: 40 meq via ORAL
  Filled 2022-07-30: qty 2

## 2022-07-30 MED ORDER — POTASSIUM CHLORIDE CRYS ER 20 MEQ PO TBCR: 40.0000 meq | EXTENDED_RELEASE_TABLET | Freq: Every day | ORAL | 0 refills | Status: DC

## 2022-07-30 NOTE — Progress Notes (Signed)
Nsg Discharge Note  Admit Date:  07/27/2022 Discharge date: 07/30/2022   Leah Padilla to be D/C'd Home per MD order.  AVS completed.   Patient/caregiver able to verbalize understanding.  Discharge Medication: Allergies as of 07/30/2022       Reactions   Doxycycline Nausea Only   Respiratory Distress, nausea, loss of appetite   Penicillins Hives, Nausea Only   Ciprofloxacin Other (See Comments)   Dry mouth, skin tingling, panicky, stomach pain, anxious and nervous   Estrace [estradiol]    Unknown reaction   Nitrofurantoin Other (See Comments)   Chills and hot flashes    Polymox [amoxicillin] Hives   Progesterone    Burning / vaginal discharge         Medication List     STOP taking these medications    Clenpiq 10-3.5-12 MG-GM -GM/175ML Soln Generic drug: Sod Picosulfate-Mag Ox-Cit Acd       TAKE these medications    acetaminophen 500 MG tablet Commonly known as: TYLENOL Take 1,000 mg by mouth every 6 (six) hours as needed for moderate pain.   ALPRAZolam 1 MG tablet Commonly known as: XANAX Take 1 mg by mouth 4 (four) times daily as needed for anxiety.   amLODipine 10 MG tablet Commonly known as: NORVASC Take 1 tablet (10 mg total) by mouth daily.   cetirizine 10 MG tablet Commonly known as: ZYRTEC Take 10 mg by mouth daily as needed for allergies.   imipramine 50 MG tablet Commonly known as: TOFRANIL Take 50 mg by mouth at bedtime.   Linzess 290 MCG Caps capsule Generic drug: linaclotide Take 290 mcg by mouth daily before breakfast.   metoprolol tartrate 25 MG tablet Commonly known as: LOPRESSOR Take 1 tablet (25 mg total) by mouth 2 (two) times daily.   multivitamin tablet Take 1 tablet by mouth daily.   potassium chloride SA 20 MEQ tablet Commonly known as: KLOR-CON M Take 2 tablets (40 mEq total) by mouth daily for 4 days.   PROBIOTIC PO Take 1 capsule by mouth daily.   rOPINIRole 0.5 MG tablet Commonly known as: REQUIP TAKE 1  TABLET(0.5 MG) BY MOUTH AT BEDTIME What changed: See the new instructions.   simvastatin 20 MG tablet Commonly known as: ZOCOR Take 20 mg by mouth daily in the afternoon.   triamcinolone cream 0.1 % Commonly known as: KENALOG Apply 1 Application topically 2 (two) times daily.        Discharge Assessment: Vitals:   07/30/22 0432 07/30/22 0837  BP: 115/74   Pulse: 88 98  Resp: 18   Temp: 98.1 F (36.7 C)   SpO2: 99%    Skin clean, dry and intact without evidence of skin break down, no evidence of skin tears noted. IV catheter discontinued intact. Site without signs and symptoms of complications - no redness or edema noted at insertion site, patient denies c/o pain - only slight tenderness at site.  Dressing with slight pressure applied.  D/c Instructions-Education: Discharge instructions given to patient/family with verbalized understanding. D/c education completed with patient/family including follow up instructions, medication list, d/c activities limitations if indicated, with other d/c instructions as indicated by MD - patient able to verbalize understanding, all questions fully answered. Patient instructed to return to ED, call 911, or call MD for any changes in condition.  Patient escorted via [place], and D/C home via private auto.  Kathie Rhodes, RN 07/30/2022 1:48 PM

## 2022-07-30 NOTE — Discharge Summary (Signed)
Physician Discharge Summary   Patient: Leah Padilla MRN: [id number] DOB: 1960-11-14  Admit date:     07/27/2022  Discharge date: 07/30/22  Discharge Physician: Barton Dubois   PCP: Alvira Monday, FNP   Recommendations at discharge:  Repeat basic metabolic panel to follow electrolytes and renal function Reassess blood pressure and adjust antihypertensive treatment as needed Make sure patient has follow-up with gastroenterology and oncology as previously instructed/arrange.  Discharge Diagnoses: Principal Problem:   AKI (acute kidney injury) ([place]) Active Problems:   Hypercalcemia   Hypokalemia   Essential hypertension   Lymphadenopathy, generalized   Stage 3b chronic kidney disease (CKD) [facility])  Brief Hospital Course: As per H&P written by Dr. Waldron Labs on 07/27/2022 Leah Padilla  is a 61 y.o. 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 -Patient does report nausea, generalized weakness, fatigue, lack of energy, poor appetite and oral intake, she had repeat PET scan done today, and routine labs showing significant labs abnormalities including hypercalcemia, hypokalemia, AKI for which she was instructed to come to ED. [name] labs in ED confirming hypokalemia with potassium of 2.7, creatinine of 2.84, calcium of 13.3, alk phos of 157, total protein of 8.5, albumin normal at 3.5, normal hemoglobin at 12.6, white blood cell of 12.2.  [facility] hospitalist consulted to admit.  Assessment and Plan: 1-hypercalcemia -Improved/resolved after receiving zoledronic acid, IV fluids and furosemide. -Calcium 8.5 currently. -Continue to  follow pending results for multiple myeloma, SPEP/UPEP, and PTH (pending). -Continue patient follow-up with hematology/oncology service and with gastroenterology for pending colonoscopy evaluation. -Patient has been advised to maintain adequate hydration and follow electrolytes trend.   2-acute kidney injury on chronic kidney disease stage IIIb -In the setting of prerenal azotemia and dehydration -Renal ultrasound unremarkable except for small nonobstructive calculi -Renal function improved and pretty much back to normal after fluid resuscitation -Patient advised to maintain adequate hydration -Creatinine at discharge 1.9 -Patient creatinine around 2 from 6 months ago.  GFR in the 40s-50 range.   3-hypokalemia -Repleted and essentially within normal limits currently. -Continue to follow electrolytes trend with repeat basic metabolic panel follow-up visit.. -Patient advised to maintain adequate hydration and nutrition.   4-hypertension -Stable overall. -Continue current antihypertensive agents.   5-hyperlipidemia -Continue statin. -Heart healthy diet discussed with patient.   6-anxiety -Continue the use of Tofranil. -Mood overall stable.  Consultants: Oncology Procedures performed: See below for x-ray reports Disposition: Home Diet recommendation: Heart healthy diet.  DISCHARGE MEDICATION: Allergies as of 07/30/2022       Reactions   Doxycycline Nausea Only   Respiratory Distress, nausea, loss of appetite   Penicillins Hives, Nausea Only   Ciprofloxacin Other (See Comments)   Dry mouth, skin tingling, panicky, stomach pain, anxious and nervous   Estrace [estradiol]    Unknown reaction   Nitrofurantoin Other (See Comments)   Chills and hot flashes    Polymox [amoxicillin] Hives   Progesterone    Burning / vaginal discharge         Medication List     STOP taking these medications    Clenpiq 10-3.5-12 MG-GM -GM/175ML Soln Generic drug: Sod Picosulfate-Mag  Ox-Cit Acd       TAKE these medications  acetaminophen 500 MG tablet Commonly known as: TYLENOL Take 1,000 mg by mouth every 6 (six) hours as needed for moderate pain.   ALPRAZolam 1 MG tablet Commonly known as: XANAX Take 1 mg by mouth 4 (four) times daily as needed for anxiety.   amLODipine 10 MG tablet Commonly known as: NORVASC Take 1 tablet (10 mg total) by mouth daily.   cetirizine 10 MG tablet Commonly known as: ZYRTEC Take 10 mg by mouth daily as needed for allergies.   imipramine 50 MG tablet Commonly known as: TOFRANIL Take 50 mg by mouth at bedtime.   Linzess 290 MCG Caps capsule Generic drug: linaclotide Take 290 mcg by mouth daily before breakfast.   metoprolol tartrate 25 MG tablet Commonly known as: LOPRESSOR Take 1 tablet (25 mg total) by mouth 2 (two) times daily.   multivitamin tablet Take 1 tablet by mouth daily.   potassium chloride SA 20 MEQ tablet Commonly known as: KLOR-CON M Take 2 tablets (40 mEq total) by mouth daily for 4 days.   PROBIOTIC PO Take 1 capsule by mouth daily.   rOPINIRole 0.5 MG tablet Commonly known as: REQUIP TAKE 1 TABLET(0.5 MG) BY MOUTH AT BEDTIME What changed: See the new instructions.   simvastatin 20 MG tablet Commonly known as: ZOCOR Take 20 mg by mouth daily in the afternoon.   triamcinolone cream 0.1 % Commonly known as: KENALOG Apply 1 Application topically 2 (two) times daily.        Follow-up Information     Alvira Monday, FNP. Schedule an appointment as soon as possible for a visit in 10 day(s).   Specialty: Family Medicine Contact information: [address] [place] [address] [phone]                Discharge Exam: Filed Weights   08-08-2022 1301 2022-08-08 1738 07/28/22 0411  Weight: 57.8 kg 58.7 kg 60.8 kg   General exam: Alert, awake, oriented x 3; reporting still feeling slightly weak but much improved.  No chest pain, no nausea, no vomiting.  Patient is feeling  ready to go home. Respiratory system: Clear to auscultation. Respiratory effort normal.  Good saturation on room air. Cardiovascular system:RRR. No rubs or gallops. Gastrointestinal system: Abdomen is nondistended, soft and nontender. No organomegaly or masses felt. Normal bowel sounds heard. Central nervous system: Alert and oriented. No focal neurological deficits. Extremities: No cyanosis or clubbing.  No edema. Skin: No petechiae. Psychiatry: Judgement and insight appear normal. Mood & affect appropriate.   Condition at discharge: Stable and improved.  The results of significant diagnostics from this hospitalization (including imaging, microbiology, ancillary and laboratory) are listed below for reference.   Imaging Studies: US RENAL  Result Date: 07/28/2022 CLINICAL DATA:  Renal dysfunction EXAM: RENAL / URINARY TRACT ULTRASOUND COMPLETE COMPARISON:  Previous studies including PET-CT done on 08-08-2022 FINDINGS: Right Kidney: Renal measurements: 12.3 x 5.5 x 4.3 cm = volume: 154 mL. There is no hydronephrosis. There is minimal increased cortical echogenicity. Left Kidney: Renal measurements: 11.5 x 5.3 x 4.6 cm = volume: 146 mL. There is no hydronephrosis. There is 4 mm hyperechoic focus in the upper pole of left kidney suggesting renal stone. There may be few other smaller stones in the upper pole of left kidney. There is slight increase in cortical echogenicity. Bladder: Appears normal for degree of bladder distention. Other: None. IMPRESSION: There is no hydronephrosis. There is slightly increased cortical echogenicity in both kidneys which may be a technical artifact or  suggest medical renal disease. There are few small nonobstructing calculi in the upper pole of left kidney. Electronically Signed   By: Elmer Picker M.D.   On: 07/28/2022 13:10   DG Chest Port 1 View  Result Date: 07/27/2022 CLINICAL DATA:  Hypercalcemia, history of hematologic malignancy and lymphadenopathy  EXAM: PORTABLE CHEST 1 VIEW COMPARISON:  01/14/2022 FINDINGS: Single frontal view of the chest demonstrates an unremarkable cardiac silhouette. No airspace disease, effusion, or pneumothorax. No acute bony abnormalities. IMPRESSION: 1. No acute intrathoracic process. Electronically Signed   By: Randa Ngo M.D.   On: 07/27/2022 17:24    Microbiology: Results for orders placed or performed during the hospital encounter of 07/27/22  MRSA Next Gen by PCR, Nasal     Status: None   Collection Time: 07/27/22  5:36 PM   Specimen: Nasal Mucosa; Nasal Swab  Result Value Ref Range Status   MRSA by PCR Next Gen NOT DETECTED NOT DETECTED Final    Comment: (NOTE) The GeneXpert MRSA Assay (FDA approved for NASAL specimens only), is one component of a comprehensive MRSA colonization surveillance program. It is not intended to diagnose MRSA infection nor to guide or monitor treatment for MRSA infections. Test performance is not FDA approved in patients less than 31 years old. Performed at [facility], [address], [place], [place] [address]     Labs: CBC: Recent Labs  Lab 07/27/22 1118 07/27/22 1410 07/28/22 0444 07/30/22 0544  WBC 13.8* 11.2* 7.6 8.5  NEUTROABS 10.3* 8.5*  --   --   HGB 12.6 12.6 9.8* 9.9*  HCT 37.4 37.3 29.2* 30.4*  MCV 84.6 84.0 85.4 88.1  PLT 490* 474* 363 315   Basic Metabolic Panel: Recent Labs  Lab 07/27/22 1740 07/27/22 2013 07/28/22 0004 07/28/22 0444 07/28/22 0806 07/28/22 1208 07/28/22 1619 07/29/22 0008 07/29/22 0437 07/29/22 0614 07/29/22 1218 07/30/22 0544  NA  --    < > 136 137 138 138  --   --   --   --   --  140  K  --    < > 3.1* 3.5 3.6 3.8  --   --   --   --   --  3.0*  CL  --    < > 112* 113* 114* 116*  --   --   --   --   --  119*  CO2  --    < > 20* 19* 18* 18*  --   --   --   --   --  17*  GLUCOSE  --    < > 88 91 100* 94  --   --   --   --   --  81  BUN  --    < > _0 --   --   --   --   --  11  CREATININE  --    < >  2.56* 2.26* 2.18* 2.32*  --   --   --   --   --  1.95*  CALCIUM  --    < > 10.5* 10.3 10.2 10.0   < > 9.6 9.3 9.3 8.8* 8.5*  PHOS 2.5  --   --   --   --   --   --   --   --   --   --   --    < > = values in this interval not displayed.   Liver Function Tests:  Recent Labs  Lab 07/27/22 1118 07/27/22 1410 07/27/22 1700 07/30/22 0544  AST 28 34 26 28  ALT _0 ALKPHOS 149* 157* 136* 123  BILITOT 0.5 0.5 0.4 0.3  PROT 8.1 8.5* 7.3 6.2*  ALBUMIN 3.5 3.5 3.3* 2.7*   CBG: No results for input(s): "GLUCAP" in the last 168 hours.  Discharge time spent: greater than 30 minutes.  Signed: Barton Dubois, MD [facility] 07/30/2022

## 2022-07-31 LAB — CBC
HCT: 29.2 % — ABNORMAL LOW (ref 36.0–46.0)
Hemoglobin: 9.8 g/dL — ABNORMAL LOW (ref 12.0–15.0)
MCH: 28.7 pg (ref 26.0–34.0)
MCHC: 33.6 g/dL (ref 30.0–36.0)
MCV: 85.4 fL (ref 80.0–100.0)
Platelets: 363 10*3/uL (ref 150–400)
RBC: 3.42 MIL/uL — ABNORMAL LOW (ref 3.87–5.11)
RDW: 15.4 % (ref 11.5–15.5)
WBC: 7.6 10*3/uL (ref 4.0–10.5)
nRBC: 0 % (ref 0.0–0.2)

## 2022-07-31 LAB — PROTEIN ELECTROPHORESIS, SERUM
A/G Ratio: 0.8 (ref 0.7–1.7)
Albumin ELP: 3.2 g/dL (ref 2.9–4.4)
Alpha-1-Globulin: 0.2 g/dL (ref 0.0–0.4)
Alpha-2-Globulin: 0.8 g/dL (ref 0.4–1.0)
Beta Globulin: 0.9 g/dL (ref 0.7–1.3)
Gamma Globulin: 1.8 g/dL (ref 0.4–1.8)
Globulin, Total: 3.8 g/dL (ref 2.2–3.9)
Total Protein ELP: 7 g/dL (ref 6.0–8.5)

## 2022-07-31 NOTE — Telephone Encounter (Signed)
Pt called and stated she was in the hospital from 07/27/22-07/30/22, she was given heparin while in the hospital. She wanted to make sure it was ok to procedure with the colonoscopy tomorrow. Spoke with Loma Sousa, NP and Cyril Mourning, PA-C and both said it was ok for her to proceed. Pt verbalized understanding.

## 2022-08-01 ENCOUNTER — Ambulatory Visit (HOSPITAL_BASED_OUTPATIENT_CLINIC_OR_DEPARTMENT_OTHER)
Admission: RE | Admit: 2022-08-01 | Discharge: 2022-08-01 | Disposition: A | Discharge status: Home / Self Care | Payer: 59 | Source: Home / Self Care | Attending: Internal Medicine | Admitting: Internal Medicine

## 2022-08-01 ENCOUNTER — Encounter (HOSPITAL_COMMUNITY): Payer: Self-pay

## 2022-08-01 ENCOUNTER — Ambulatory Visit (HOSPITAL_COMMUNITY): Payer: 59 | Admitting: Anesthesiology

## 2022-08-01 ENCOUNTER — Inpatient Hospital Stay (HOSPITAL_BASED_OUTPATIENT_CLINIC_OR_DEPARTMENT_OTHER): Payer: 59 | Admitting: Hematology

## 2022-08-01 ENCOUNTER — Encounter (HOSPITAL_COMMUNITY)
Admission: RE | Disposition: A | Discharge status: Home / Self Care | Payer: Self-pay | Source: Home / Self Care | Attending: Internal Medicine

## 2022-08-01 ENCOUNTER — Other Ambulatory Visit: Payer: Self-pay

## 2022-08-01 ENCOUNTER — Encounter: Payer: Self-pay | Admitting: *Deleted

## 2022-08-01 ENCOUNTER — Ambulatory Visit: Payer: 59 | Admitting: Hematology

## 2022-08-01 ENCOUNTER — Ambulatory Visit (HOSPITAL_BASED_OUTPATIENT_CLINIC_OR_DEPARTMENT_OTHER): Payer: 59 | Admitting: Anesthesiology

## 2022-08-01 VITALS — BP 129/87 | HR 103 | Temp 98.2°F | Resp 18 | Ht 68.5 in | Wt 125.8 lb

## 2022-08-01 DIAGNOSIS — K648 Other hemorrhoids: Secondary | ICD-10-CM

## 2022-08-01 DIAGNOSIS — E7800 Pure hypercholesterolemia, unspecified: Secondary | ICD-10-CM | POA: Insufficient documentation

## 2022-08-01 DIAGNOSIS — R591 Generalized enlarged lymph nodes: Secondary | ICD-10-CM | POA: Diagnosis present

## 2022-08-01 DIAGNOSIS — I129 Hypertensive chronic kidney disease with stage 1 through stage 4 chronic kidney disease, or unspecified chronic kidney disease: Secondary | ICD-10-CM | POA: Insufficient documentation

## 2022-08-01 DIAGNOSIS — K6389 Other specified diseases of intestine: Secondary | ICD-10-CM | POA: Diagnosis not present

## 2022-08-01 DIAGNOSIS — F32A Depression, unspecified: Secondary | ICD-10-CM | POA: Insufficient documentation

## 2022-08-01 DIAGNOSIS — N1832 Chronic kidney disease, stage 3b: Secondary | ICD-10-CM

## 2022-08-01 DIAGNOSIS — R194 Change in bowel habit: Secondary | ICD-10-CM | POA: Diagnosis not present

## 2022-08-01 DIAGNOSIS — Z1212 Encounter for screening for malignant neoplasm of rectum: Secondary | ICD-10-CM

## 2022-08-01 DIAGNOSIS — G2581 Restless legs syndrome: Secondary | ICD-10-CM | POA: Insufficient documentation

## 2022-08-01 DIAGNOSIS — N189 Chronic kidney disease, unspecified: Secondary | ICD-10-CM | POA: Diagnosis not present

## 2022-08-01 DIAGNOSIS — K529 Noninfective gastroenteritis and colitis, unspecified: Secondary | ICD-10-CM | POA: Insufficient documentation

## 2022-08-01 DIAGNOSIS — K5900 Constipation, unspecified: Secondary | ICD-10-CM

## 2022-08-01 DIAGNOSIS — R195 Other fecal abnormalities: Secondary | ICD-10-CM | POA: Insufficient documentation

## 2022-08-01 DIAGNOSIS — Z87891 Personal history of nicotine dependence: Secondary | ICD-10-CM

## 2022-08-01 DIAGNOSIS — D509 Iron deficiency anemia, unspecified: Secondary | ICD-10-CM | POA: Diagnosis not present

## 2022-08-01 DIAGNOSIS — Z79899 Other long term (current) drug therapy: Secondary | ICD-10-CM | POA: Insufficient documentation

## 2022-08-01 DIAGNOSIS — Z1211 Encounter for screening for malignant neoplasm of colon: Secondary | ICD-10-CM | POA: Insufficient documentation

## 2022-08-01 DIAGNOSIS — D631 Anemia in chronic kidney disease: Secondary | ICD-10-CM | POA: Insufficient documentation

## 2022-08-01 DIAGNOSIS — F419 Anxiety disorder, unspecified: Secondary | ICD-10-CM | POA: Insufficient documentation

## 2022-08-01 HISTORY — PX: BIOPSY: SHX5522

## 2022-08-01 HISTORY — PX: COLONOSCOPY WITH PROPOFOL: SHX5780

## 2022-08-01 LAB — UPEP/UIFE/LIGHT CHAINS/TP, 24-HR UR
% BETA, Urine: 36.5 %
ALPHA 1 URINE: 8.4 %
Albumin, U: 12.5 %
Alpha 2, Urine: 23 %
Free Kappa Lt Chains,Ur: 115.39 mg/L — ABNORMAL HIGH (ref 1.17–86.46)
Free Kappa/Lambda Ratio: 3.19 (ref 1.83–14.26)
Free Lambda Lt Chains,Ur: 36.18 mg/L — ABNORMAL HIGH (ref 0.27–15.21)
GAMMA GLOBULIN URINE: 19.6 %
Total Protein, Urine-Ur/day: 1023 mg/24 hr — ABNORMAL HIGH (ref 30–150)
Total Protein, Urine: 18.6 mg/dL
Total Volume: 5500

## 2022-08-01 LAB — MULTIPLE MYELOMA PANEL, SERUM
Albumin SerPl Elph-Mcnc: 3.2 g/dL (ref 2.9–4.4)
Albumin/Glob SerPl: 0.9 (ref 0.7–1.7)
Alpha 1: 0.2 g/dL (ref 0.0–0.4)
Alpha2 Glob SerPl Elph-Mcnc: 0.8 g/dL (ref 0.4–1.0)
B-Globulin SerPl Elph-Mcnc: 0.8 g/dL (ref 0.7–1.3)
Gamma Glob SerPl Elph-Mcnc: 1.8 g/dL (ref 0.4–1.8)
Globulin, Total: 3.6 g/dL (ref 2.2–3.9)
IgA: 205 mg/dL (ref 87–352)
IgG (Immunoglobin G), Serum: 2444 mg/dL — ABNORMAL HIGH (ref 586–1602)
IgM (Immunoglobulin M), Srm: 68 mg/dL (ref 26–217)
Total Protein ELP: 6.8 g/dL (ref 6.0–8.5)

## 2022-08-01 SURGERY — COLONOSCOPY WITH PROPOFOL
Anesthesia: General

## 2022-08-01 MED ORDER — PROPOFOL 500 MG/50ML IV EMUL
INTRAVENOUS | Status: DC | PRN
  Administered 2022-08-01: 150 ug/kg/min via INTRAVENOUS

## 2022-08-01 MED ORDER — LACTATED RINGERS IV SOLN: INTRAVENOUS | Status: DC

## 2022-08-01 MED ORDER — PROPOFOL 10 MG/ML IV BOLUS
INTRAVENOUS | Status: DC | PRN
  Administered 2022-08-01: 100 mg via INTRAVENOUS

## 2022-08-01 MED ORDER — LIDOCAINE HCL (CARDIAC) PF 100 MG/5ML IV SOSY
PREFILLED_SYRINGE | INTRAVENOUS | Status: DC | PRN
  Administered 2022-08-01: 50 mg via INTRAVENOUS

## 2022-08-01 NOTE — Anesthesia Preprocedure Evaluation (Addendum)
Anesthesia Evaluation  Patient identified by MRN, date of birth, ID band Patient awake    Reviewed: Allergy & Precautions, NPO status , Patient's Chart, lab work & pertinent test results  Airway Mallampati: III  TM Distance: >3 FB Neck ROM: Full  Mouth opening: Limited Mouth Opening  Dental  (+) Dental Advisory Given, Missing   Pulmonary neg pulmonary ROS, former smoker,    Pulmonary exam normal breath sounds clear to auscultation       Cardiovascular hypertension, Pt. on medications Normal cardiovascular exam Rhythm:Regular Rate:Normal     Neuro/Psych PSYCHIATRIC DISORDERS Anxiety Depression  Neuromuscular disease (bell's palsy)    GI/Hepatic negative GI ROS, (+)     substance abuse  marijuana use,   Endo/Other  negative endocrine ROS  Renal/GU Renal InsufficiencyRenal disease  negative genitourinary   Musculoskeletal negative musculoskeletal ROS (+)   Abdominal   Peds negative pediatric ROS (+)  Hematology  (+) Blood dyscrasia, anemia ,   Anesthesia Other Findings AKI (acute kidney injury) (HCC) Absolute anemia Acute cystitis Acute metabolic encephalopathy Allergic reaction Anxiety Burning with urination Chronic sinusitis of both maxillary sinuses Constipation Dysuria Essential hypertension HLD (hyperlipidemia) Hematuria History of Bell's palsy History of UTI History of hypercholesterolemia Hot flashes Hypercalcemia Hypokalemia Hypomagnesemia Hypophosphatemia Left-sided Bell's palsy Lymphadenopathy, generalized Nausea and vomiting Positive colorectal cancer screening using Cologuard test Psychotic disorder (HCC) Rash and nonspecific skin eruption Rectocele Recurrent UTI Restless legs syndrome Stage 3b chronic kidney disease (CKD) (HCC) Urinary frequency Urinary tract infection with hematuria Vaginal atrophy Vaginal discharge Vaginal dryness, menopausal Vulvar irritation     Reproductive/Obstetrics negative OB ROS                             Anesthesia Physical Anesthesia Plan  ASA: 3  Anesthesia Plan: General   Post-op Pain Management: Minimal or no pain anticipated   Induction: Intravenous  PONV Risk Score and Plan: Propofol infusion  Airway Management Planned: Nasal Cannula, Natural Airway and Simple Face Mask  Additional Equipment:   Intra-op Plan:   Post-operative Plan:   Informed Consent: I have reviewed the patients History and Physical, chart, labs and discussed the procedure including the risks, benefits and alternatives for the proposed anesthesia with the patient or authorized representative who has indicated his/her understanding and acceptance.     Dental advisory given  Plan Discussed with: CRNA and Surgeon  Anesthesia Plan Comments:        Anesthesia Quick Evaluation

## 2022-08-01 NOTE — Patient Instructions (Signed)
[place] at [facility] [place] Discharge Instructions   You were seen and examined today by Dr. Delton Coombes.  He reviewed the results of your PET scan which is stable.   Dr. Raliegh Ip recommends a biopsy of right lymph node of the groin.   We will refer you to social worker for financial assistance and to get these things scheduled.   Return as scheduled   Thank you for choosing [place] at [facility] to provide your oncology and hematology care.  To afford each patient quality time with our provider, please arrive at least 15 minutes before your scheduled appointment time.   If you have a lab appointment with the [facility] please come in thru the Main Entrance and check in at the main information desk.  You need to re-schedule your appointment should you arrive 10 or more minutes late.  We strive to give you quality time with our providers, and arriving late affects you and other patients whose appointments are after yours.  Also, if you no show three or more times for appointments you may be dismissed from the clinic at the providers discretion.     Again, thank you for choosing [facility]  Our hope is that these requests will decrease the amount of time that you wait before being seen by our physicians.       _____________________________________________________________  Should you have questions after your visit to [facility], please contact our office at [phone] and follow the prompts.  Our office hours are 8:00 a.m. and 4:30 p.m. Monday - Friday.  Please note that voicemails left after 4:00 p.m. may not be returned until the following business day.  We are closed weekends and major holidays.  You do have access to a nurse 24-7, just call the main number to the clinic [phone] and do not press any options, hold on the line and a nurse will answer the phone.    For prescription refill requests, have your  pharmacy contact our office and allow 72 hours.    Due to Covid, you will need to wear a mask upon entering the hospital. If you do not have a mask, a mask will be given to you at the Main Entrance upon arrival. For doctor visits, patients may have 1 support person age 43 or older with them. For treatment visits, patients can not have anyone with them due to social distancing guidelines and our immunocompromised population.

## 2022-08-01 NOTE — Anesthesia Postprocedure Evaluation (Signed)
Anesthesia Post Note  Patient: [name]  Procedure(s) Performed: COLONOSCOPY WITH PROPOFOL BIOPSY  Patient location during evaluation: Phase II Anesthesia Type: General Level of consciousness: awake and alert and oriented Pain management: pain level controlled Vital Signs Assessment: post-procedure vital signs reviewed and stable Respiratory status: spontaneous breathing, nonlabored ventilation and respiratory function stable Cardiovascular status: blood pressure returned to baseline and stable Postop Assessment: no apparent nausea or vomiting Anesthetic complications: no   No notable events documented.   Last Vitals:  Vitals:   08/01/22 1215 08/01/22 1246  BP: 132/89 (!) 93/48  Pulse: 99 88  Resp: 15 20  Temp: 36.8 C 36.7 C  SpO2: 100% 98%    Last Pain:  Vitals:   08/01/22 1246  TempSrc: Oral  PainSc: 0-No pain                 Antonetta Clanton C James Senn

## 2022-08-01 NOTE — Transfer of Care (Signed)
Immediate Anesthesia Transfer of Care Note  Patient: Leah Padilla  Procedure(s) Performed: COLONOSCOPY WITH PROPOFOL BIOPSY  Patient Location: Endoscopy Unit  Anesthesia Type:General  Level of Consciousness: awake  Airway & Oxygen Therapy: Patient Spontanous Breathing  Post-op Assessment: Report given to RN and Post -op Vital signs reviewed and stable  Post vital signs: Reviewed and stable  Last Vitals:  Vitals Value Taken Time  BP 93/48 08/01/22 1246  Temp 36.7 C 08/01/22 1246  Pulse 88 08/01/22 1246  Resp 20 08/01/22 1246  SpO2 98 % 08/01/22 1246    Last Pain:  Vitals:   08/01/22 1246  TempSrc: Oral  PainSc: 0-No pain      Patients Stated Pain Goal: 8 (97/41/63 [id number])  Complications: No notable events documented.

## 2022-08-01 NOTE — Op Note (Signed)
[facility] Patient Name: Leah Padilla Procedure Date: 08/01/2022 12:09 PM MRN: [id number] Date of Birth: [birth year] Attending MD: Elon Alas. Abbey Chatters , [place], [id number] CSN: [id number] Age: 61 Admit Type: Outpatient Procedure:                Colonoscopy Indications:              Positive Cologuard test Providers:                Elon Alas. Abbey Chatters, DO, Rosina Lowenstein, RN, Ladoris Gene Technician, Technician Referring MD:             Elon Alas. Abbey Chatters, DO Medicines:                See the Anesthesia note for documentation of the                            administered medications Complications:            No immediate complications. Estimated Blood Loss:     Estimated blood loss was minimal. Procedure:                Pre-Anesthesia Assessment:                           - The anesthesia plan was to use monitored                            anesthesia care (MAC).                           After obtaining informed consent, the colonoscope                            was passed under direct vision. Throughout the                            procedure, the patient's blood pressure, pulse, and                            oxygen saturations were monitored continuously. The                            [id number]-[id number] ([id number]) scope was introduced through                            the anus and advanced to the the cecum, identified                            by appendiceal orifice and ileocecal valve. The                            colonoscopy was performed without difficulty. The                            patient tolerated the  procedure well. The quality                            of the bowel preparation was evaluated using the                            BBPS [facility] Bowel Preparation Scale) with scores                            of: Right Colon = 3, Transverse Colon = 3 and Left                            Colon = 3 (entire mucosa seen well with no residual                             staining, small fragments of stool or opaque                            liquid). The total BBPS score equals 9. Scope In: 12:30:00 PM Scope Out: 12:42:19 PM Scope Withdrawal Time: 0 hours 7 minutes 22 seconds  Total Procedure Duration: 0 hours 12 minutes 19 seconds  Findings:      The perianal and digital rectal examinations were normal.      Non-bleeding internal hemorrhoids were found during endoscopy.      Moderately nodular mucosa was found in the transverse colon, in the       ascending colon and in the cecum. Biopsies were taken with a cold       forceps for histology. Impression:               - Non-bleeding internal hemorrhoids.                           - Nodular mucosa in the transverse colon, in the                            ascending colon and in the cecum. Biopsied. Moderate Sedation:      Per Anesthesia Care Recommendation:           - Patient has a contact number available for                            emergencies. The signs and symptoms of potential                            delayed complications were discussed with the                            patient. Return to normal activities tomorrow.                            Written discharge instructions were provided to the                            patient.                           -  Resume previous diet.                           - Continue present medications.                           - Await pathology results.                           - Repeat colonoscopy date to be determined after                            pending pathology results are reviewed for                            surveillance based on pathology results.                           - Return to GI clinic in 3 months. Procedure Code(s):        --- Professional ---                           [phone], Colonoscopy, flexible; with biopsy, single                            or multiple Diagnosis Code(s):        --- Professional ---                            K63.89, Other specified diseases of intestine                           K64.8, Other hemorrhoids                           R19.5, Other fecal abnormalities CPT copyright 2022 [employer]. All rights reserved. The codes documented in this report are preliminary and upon coder review may  be revised to meet current compliance requirements. Elon Alas. Abbey Chatters, DO [place] Abbey Chatters, DO 08/01/2022 12:46:44 PM This report has been signed electronically. Number of Addenda: 0

## 2022-08-01 NOTE — Interval H&P Note (Signed)
History and Physical Interval Note:  08/01/2022 12:11 PM  Leah Padilla  has presented today for surgery, with the diagnosis of postive cologuard, constipation.  The various methods of treatment have been discussed with the patient and family. After consideration of risks, benefits and other options for treatment, the patient has consented to  Procedure(s) with comments: COLONOSCOPY WITH PROPOFOL (N/A) - 1:30 pm as a surgical intervention.  The patient's history has been reviewed, patient examined, no change in status, stable for surgery.  I have reviewed the patient's chart and labs.  Questions were answered to the patient's satisfaction.     Eloise Harman

## 2022-08-01 NOTE — Progress Notes (Signed)
[place], [place] [address]   CLINIC:  Medical Oncology/Hematology  PCP:  Alvira Monday, [address] / [place] [address]  [phone]  REASON FOR VISIT:  Follow-up for diffuse lymphadenopathy and hypercalcemia of malignancy  PRIOR THERAPY: none  CURRENT THERAPY: Surveillance  INTERVAL HISTORY:  Ms. Blythe Veach, a 61 y.o. female, seen for follow-up of generalized lymphadenopathy, hypercalcemia and iron deficiency anemia.  Recently hospitalized with hypercalcemia and acute kidney injury.  She reports that her insurance is running out today and that she is scheduled for colonoscopy later today.  Energy levels are 70%.  REVIEW OF SYSTEMS:  Review of Systems  Constitutional:  Negative for appetite change, fatigue, fever and unexpected weight change.  Gastrointestinal:  Positive for nausea. Negative for constipation.  Endocrine: Negative for hot flashes.  Neurological:  Positive for numbness (Fingers).  Psychiatric/Behavioral:  Positive for depression. The patient is nervous/anxious.   All other systems reviewed and are negative.   PAST MEDICAL/SURGICAL HISTORY:  Past Medical History:  Diagnosis Date   Anxiety    Bell's palsy    Depression    High cholesterol    Hypertension    Past Surgical History:  Procedure Laterality Date   AXILLARY LYMPH NODE BIOPSY Right 01/17/2022   Procedure: AXILLARY LYMPH NODE BIOPSY;  Surgeon: Rusty Aus, DO;  Location: [facility];  Service: General;  Laterality: Right;   COLONOSCOPY N/A 04/17/2013   Procedure: COLONOSCOPY;  Surgeon: Rogene Houston, MD;  Location: [facility];  Service: Endoscopy;  Laterality: N/A;  930   CYST EXCISION     neck    SOCIAL HISTORY:  Social History   Socioeconomic History   Marital status: Divorced    Spouse name: Not on file   Number of children: 2   Years of education: Not on file   Highest education level: Not on file  Occupational  History   Not on file  Tobacco Use   Smoking status: Former    Packs/day: 0.50    Years: 13.00    Total pack years: 6.50    Types: Cigarettes    Quit date: 12/13/2016    Years since quitting: 5.6   Smokeless tobacco: Never  Vaping Use   Vaping Use: Former  Substance and Sexual Activity   Alcohol use: Not Currently    Comment: occasional   Drug use: Yes    Frequency: 3.0 times per week    Types: Marijuana    Comment: last used 07/30/22   Sexual activity: Yes    Birth control/protection: Post-menopausal  Other Topics Concern   Not on file  Social History Narrative   Not on file   Social Determinants of Health   Financial Resource Strain: Not on file  Food Insecurity: No Food Insecurity (07/27/2022)   Hunger Vital Sign    Worried About Running Out of Food in the Last Year: Never true    Ran Out of Food in the Last Year: Never true  Transportation Needs: No Transportation Needs (07/27/2022)   PRAPARE - Hydrologist (Medical): No    Lack of Transportation (Non-Medical): No  Physical Activity: Not on file  Stress: Not on file  Social Connections: Not on file  Intimate Partner Violence: Not At Risk (07/27/2022)   Humiliation, Afraid, Rape, and Kick questionnaire    Fear of Current or Ex-Partner: No    Emotionally Abused: No    Physically  Abused: No    Sexually Abused: No    FAMILY HISTORY:  Family History  Problem Relation Age of Onset   Multiple myeloma Mother    Stroke Father    Breast cancer Sister    Stroke Other    Diabetes Other    Colon cancer Neg Hx     CURRENT MEDICATIONS:  Current Outpatient Medications  Medication Sig Dispense Refill   acetaminophen (TYLENOL) 500 MG tablet Take 1,000 mg by mouth every 6 (six) hours as needed for moderate pain.     ALPRAZolam (XANAX) 1 MG tablet Take 1 mg by mouth 4 (four) times daily as needed for anxiety.      amLODipine (NORVASC) 10 MG tablet Take 1 tablet (10 mg total) by mouth daily.  30 tablet 0   cetirizine (ZYRTEC) 10 MG tablet Take 10 mg by mouth daily as needed for allergies.     imipramine (TOFRANIL) 50 MG tablet Take 50 mg by mouth at bedtime.     linaclotide (LINZESS) 290 MCG CAPS capsule Take 290 mcg by mouth daily before breakfast.     metoprolol tartrate (LOPRESSOR) 25 MG tablet Take 1 tablet (25 mg total) by mouth 2 (two) times daily. 60 tablet 0   Multiple Vitamin (MULTIVITAMIN) tablet Take 1 tablet by mouth daily.     potassium chloride SA (KLOR-CON M) 20 MEQ tablet Take 2 tablets (40 mEq total) by mouth daily for 4 days. 8 tablet 0   Probiotic Product (PROBIOTIC PO) Take 1 capsule by mouth daily.     rOPINIRole (REQUIP) 0.5 MG tablet TAKE 1 TABLET(0.5 MG) BY MOUTH AT BEDTIME (Patient taking differently: Take 0.5 mg by mouth at bedtime.) 90 tablet 2   simvastatin (ZOCOR) 20 MG tablet Take 20 mg by mouth daily in the afternoon.     triamcinolone cream (KENALOG) 0.1 % Apply 1 Application topically 2 (two) times daily. 30 g 0   No current facility-administered medications for this visit.    ALLERGIES:  Allergies  Allergen Reactions   Doxycycline Nausea Only    Respiratory Distress, nausea, loss of appetite   Penicillins Hives and Nausea Only   Ciprofloxacin Other (See Comments)    Dry mouth, skin tingling, panicky, stomach pain, anxious and nervous    Estrace [Estradiol]     Unknown reaction   Nitrofurantoin Other (See Comments)    Chills and hot flashes    Polymox [Amoxicillin] Hives   Progesterone     Burning / vaginal discharge     PHYSICAL EXAM:  Performance status (ECOG): 1 - Symptomatic but completely ambulatory  Vitals:   08/01/22 1101  BP: 129/87  Pulse: (!) 103  Resp: 18  Temp: 98.2 F (36.8 C)  SpO2: 100%   Wt Readings from Last 3 Encounters:  08/01/22 125 lb 12.8 oz (57.1 kg)  07/28/22 134 lb 0.6 oz (60.8 kg)  07/25/22 127 lb 6.4 oz (57.8 kg)   Physical Exam Vitals reviewed.  Constitutional:      Appearance: Normal  appearance.  Cardiovascular:     Rate and Rhythm: Normal rate and regular rhythm.     Pulses: Normal pulses.     Heart sounds: Normal heart sounds.  Pulmonary:     Effort: Pulmonary effort is normal.     Breath sounds: Normal breath sounds.  Neurological:     General: No focal deficit present.     Mental Status: She is alert and oriented to person, place, and time.  Psychiatric:  Mood and Affect: Mood normal.        Behavior: Behavior normal.     LABORATORY DATA:  I have reviewed the labs as listed.     Latest Ref Rng & Units 07/30/2022    5:44 AM 07/28/2022    4:44 AM 07/27/2022    2:10 PM  CBC  WBC 4.0 - 10.5 K/uL 8.5  7.6  11.2   Hemoglobin 12.0 - 15.0 g/dL 9.9  9.8  12.6   Hematocrit 36.0 - 46.0 % 30.4  29.2  37.3   Platelets 150 - 400 K/uL 357  363  474       Latest Ref Rng & Units 07/30/2022    5:44 AM 07/29/2022   12:18 PM 07/29/2022    6:14 AM  CMP  Glucose 70 - 99 mg/dL 81     BUN 6 - 20 mg/dL 11     Creatinine 0.44 - 1.00 mg/dL 1.95     Sodium 135 - 145 mmol/L 140     Potassium 3.5 - 5.1 mmol/L 3.0     Chloride 98 - 111 mmol/L 119     CO2 22 - 32 mmol/L 17     Calcium 8.9 - 10.3 mg/dL 8.5  8.8  9.3   Total Protein 6.5 - 8.1 g/dL 6.2     Total Bilirubin 0.3 - 1.2 mg/dL 0.3     Alkaline Phos 38 - 126 U/L 123     AST 15 - 41 U/L 28     ALT 0 - 44 U/L 25         Component Value Date/Time   RBC 3.45 (L) 07/30/2022 0544   MCV 88.1 07/30/2022 0544   MCH 28.7 07/30/2022 0544   MCHC 32.6 07/30/2022 0544   RDW 16.2 (H) 07/30/2022 0544   LYMPHSABS 1.2 07/27/2022 1410   MONOABS 1.2 (H) 07/27/2022 1410   EOSABS 0.2 07/27/2022 1410   BASOSABS 0.1 07/27/2022 1410    DIAGNOSTIC IMAGING:  I have independently reviewed the scans and discussed with the patient. NM PET Image Restag (PS) Skull Base To Thigh  Result Date: 07/31/2022 CLINICAL DATA:  Subsequent treatment strategy for lymphoma. EXAM: NUCLEAR MEDICINE PET SKULL BASE TO THIGH TECHNIQUE: 6.65  mCi F-18 FDG was injected intravenously. Full-ring PET imaging was performed from the skull base to thigh after the radiotracer. CT data was obtained and used for attenuation correction and anatomic localization. Fasting blood glucose: 112 mg/dl COMPARISON:  PET-CT Feb 02, 2022 FINDINGS: Mediastinal blood pool activity: SUV max 2.1 Liver activity: SUV max 2.8 NECK: No significant interval change in the numerous subcentimeter mildly metabolic cervical lymph nodes. Indexed lymph nodes are as follows: -right level 2b lymph node measures 6 mm in short axis with a max SUV of 3.0 previously measuring 7 mm in short axis with a max SUV of 2.5 Incidental CT findings: None. CHEST: Stable size with slight interval decrease in FDG avidity mediastinal, bilateral axillary and right hilar adenopathy. Previously indexed lymph nodes are as follows: - AP window measures 9 mm on image 115/3 with a max SUV of 2.8 previously unchanged when remeasured for consistency with max SUV of 3.4. -Left axillary measures 8 mm in short axis on image 123/3 with a max SUV of 2.6, unchanged in size when remeasured for consistency with the prior max SUV of 2.6. -mild right hilar lymphadenopathy has a max SUV of 2.5 previously SUV max of 3.6. Incidental CT findings: Interval resolution of the right axillary postoperative  hematoma. ABDOMEN/PELVIS: Porta hepatis and retroperitoneal previously indexed lymph nodes are as follows: -aortocaval space lymph node measures 7 mm in short axis on image 195/3 with a max SUV of 2.2, unchanged in size when remeasured for consistency with a prior max SUV of 4.5. Decrease in size and FDG avidity of the bilateral iliac side chain lymph nodes. Previously indexed lymph nodes are as follows: -Mildly metabolic left external iliac chain lymph node measures 7 mm in short axis on image 265/3 with a max SUV of 2.6 previously measuring 12 mm in short axis with a max SUV of 4.2. -Mildly metabolic right external iliac lymph node  external iliac chain now measures 5 mm in short axis with a max SUV of 2.0 previously measuring 14 mm in short axis with a max SUV of 4.4. Similar size with slight interval increased FDG avidity in the hypermetabolic bilateral inguinal lymph nodes. For reference a hypermetabolic right inguinal lymph node measures 1 cm in short axis on image 289/3 with a max SUV of 5.4 previously measuring 1 cm in short axis with a max SUV of 3.6 No abnormal hypermetabolic activity within the liver, pancreas, adrenal glands, or spleen. Incidental CT findings: No splenomegaly. Punctate nonobstructive left renal calculi. Aortic atherosclerosis. SKELETON: No focal hypermetabolic activity to suggest skeletal metastasis. Incidental CT findings: None. IMPRESSION: 1. Overall stable size of the lymph nodes above and below the diaphragm with minimal bidirectional change in FDG avidity with some lymph nodes showing a mild decrease in FDG avidity while others demonstrate a slight increase in FDG avidity. No new sites of FDG avid adenopathy above or below the diaphragm. ([place]) 4. 2. No splenomegaly or evidence of osseous lymphomatous involvement. 3.  Aortic Atherosclerosis (ICD10-I70.0). Electronically Signed   By: Dahlia Bailiff M.D.   On: 07/31/2022 10:04   US RENAL  Result Date: 07/28/2022 CLINICAL DATA:  Renal dysfunction EXAM: RENAL / URINARY TRACT ULTRASOUND COMPLETE COMPARISON:  Previous studies including PET-CT done on 08/09/22 FINDINGS: Right Kidney: Renal measurements: 12.3 x 5.5 x 4.3 cm = volume: 154 mL. There is no hydronephrosis. There is minimal increased cortical echogenicity. Left Kidney: Renal measurements: 11.5 x 5.3 x 4.6 cm = volume: 146 mL. There is no hydronephrosis. There is 4 mm hyperechoic focus in the upper pole of left kidney suggesting renal stone. There may be few other smaller stones in the upper pole of left kidney. There is slight increase in cortical echogenicity. Bladder: Appears normal for degree  of bladder distention. Other: None. IMPRESSION: There is no hydronephrosis. There is slightly increased cortical echogenicity in both kidneys which may be a technical artifact or suggest medical renal disease. There are few small nonobstructing calculi in the upper pole of left kidney. Electronically Signed   By: Elmer Picker M.D.   On: 07/28/2022 13:10   DG Chest Port 1 View  Result Date: 08-09-2022 CLINICAL DATA:  Hypercalcemia, history of hematologic malignancy and lymphadenopathy EXAM: PORTABLE CHEST 1 VIEW COMPARISON:  01/14/2022 FINDINGS: Single frontal view of the chest demonstrates an unremarkable cardiac silhouette. No airspace disease, effusion, or pneumothorax. No acute bony abnormalities. IMPRESSION: 1. No acute intrathoracic process. Electronically Signed   By: Randa Ngo M.D.   On: August 09, 2022 17:24     ASSESSMENT:  1.  Hypercalcemia of malignancy: - Presentation with calcium level 13.4, creatinine 2.43, albumin 3.6. - Status post IV hydration and Zometa. - Calcium today improved to 11.1 and creatinine 1.8.  Albumin is 2.8. - 25-hydroxy vitamin D and  one 25-hydroxy vitamin D within normal limits.  PTH was low at 12. - PTH RP is pending. - CT CAP mild bilateral hilar and subpectoral lymphadenopathy.  Right axillary lymph node 1.5 cm.  Multiple subcentimeter mediastinal lymph nodes.  Mild abdominal lymphadenopathy in the retroperitoneum and small bowel mesentery.  Mild pelvic lymphadenopathy seen throughout the iliac chains and bilateral inguinal region.  Spleen is normal. - Right axillary lymph node biopsy (01/17/2022): Noncaseating granulomatous inflammation.  No significant staining for CD30.  Predominant T-cell population.  CD68 highlights increased histiocytes.  HHV 8 and EBV by ISH negative. - PET scan (02/02/2022): Mild hypermetabolic lymphadenopathy throughout the neck, chest, abdomen and pelvis.  SUV value in the abdomen and pelvis range between 4.2-4.5.   2.  Acute  kidney injury: - Creatinine on admission 2.4.  Improved to 1.8 today with hydration and Zometa.  3.  Social/family history: -She lives at home with her son.  She works at a [employer] in [place].  She has exposure to acetone and IPA.  She quit smoking more than 5 years ago. - No family history of sarcoidosis.  Mother has multiple myeloma.  No other malignancies.   PLAN:  1.  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 - I have reviewed PET scan from 07/27/2022.  Most of the lymph nodes have decreased in size and SUV.  Right inguinal lymph node measures 1 cm with SUV 5.4, previously 3.6. 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  I have reviewed hospital records. - She had a positive Cologuard test and is scheduled for colonoscopy later today. - Based on the PET scan findings and recurrent hypercalcemia, I have recommended additional tissue sampling.  As her right inguinal lymph node has a high SUV, I have recommended biopsy of that particular lymph node. - ACE level and PTH RP from 07/27/2022 is pending. - Immunofixation shows polyclonal gammopathy.  M spike was not observed.  UPEP was negative. - RTC after biopsy.  We will repeat CMP at that time.   2.  Hypercalcemia of malignancy: - Calcium latest is 8.5.  Down from 34.  3.  CKD: - Recent hospitalization due to AKI from hypercalcemia.  Latest labs show creatinine 1.95 on 07/30/2022.  4.  Normocytic anemia: - She received Venofer 3 infusions completed on 03/10/2022.  Hemoglobin is 9.9.  Orders placed this encounter:  No orders of the defined types were placed in this encounter.    Derek Jack, MD [place] [phone]

## 2022-08-01 NOTE — Discharge Instructions (Addendum)
  Colonoscopy Discharge Instructions  Read the instructions outlined below and refer to this sheet in the next few weeks. These discharge instructions provide you with general information on caring for yourself after you leave the hospital. Your doctor may also give you specific instructions. While your treatment has been planned according to the most current medical practices available, unavoidable complications occasionally occur.   ACTIVITY You may resume your regular activity, but move at a slower pace for the next 24 hours.  Take frequent rest periods for the next 24 hours.  Walking will help get rid of the air and reduce the bloated feeling in your belly (abdomen).  No driving for 24 hours (because of the medicine (anesthesia) used during the test).   Do not sign any important legal documents or operate any machinery for 24 hours (because of the anesthesia used during the test).  NUTRITION Drink plenty of fluids.  You may resume your normal diet as instructed by your doctor.  Begin with a light meal and progress to your normal diet. Heavy or fried foods are harder to digest and may make you feel sick to your stomach (nauseated).  Avoid alcoholic beverages for 24 hours or as instructed.  MEDICATIONS You may resume your normal medications unless your doctor tells you otherwise.  WHAT YOU CAN EXPECT TODAY Some feelings of bloating in the abdomen.  Passage of more gas than usual.  Spotting of blood in your stool or on the toilet paper.  IF YOU HAD POLYPS REMOVED DURING THE COLONOSCOPY: No aspirin products for 7 days or as instructed.  No alcohol for 7 days or as instructed.  Eat a soft diet for the next 24 hours.  FINDING OUT THE RESULTS OF YOUR TEST Not all test results are available during your visit. If your test results are not back during the visit, make an appointment with your caregiver to find out the results. Do not assume everything is normal if you have not heard from your  caregiver or the medical facility. It is important for you to follow up on all of your test results.  SEEK IMMEDIATE MEDICAL ATTENTION IF: You have more than a spotting of blood in your stool.  Your belly is swollen (abdominal distention).  You are nauseated or vomiting.  You have a temperature over 101.  You have abdominal pain or discomfort that is severe or gets worse throughout the day.   Your colonoscopy was relatively unremarkable.  I did not find any polyps or evidence of colon cancer.    The right side of your colon did appear somewhat nodular, I took extensive biopsies of this area.  Possibly related to your lymphoproliferative disorder.  Await pathology results, my office will contact you.  Follow-up with GI and 3 months.  I hope you have a great rest of your week!  Elon Alas. Abbey Chatters, D.O. Gastroenterology and Hepatology [facility]

## 2022-08-02 LAB — SURGICAL PATHOLOGY

## 2022-08-02 LAB — ANGIOTENSIN CONVERTING ENZYME: Angiotensin-Converting Enzyme: 116 U/L — ABNORMAL HIGH (ref 14–82)

## 2022-08-02 LAB — PTH-RELATED PEPTIDE: PTH-related peptide: <2 pmol/L

## 2022-08-04 ENCOUNTER — Inpatient Hospital Stay: Payer: Self-pay | Attending: Hematology | Admitting: Licensed Clinical Social Worker

## 2022-08-04 DIAGNOSIS — L928 Other granulomatous disorders of the skin and subcutaneous tissue: Secondary | ICD-10-CM | POA: Insufficient documentation

## 2022-08-04 DIAGNOSIS — R591 Generalized enlarged lymph nodes: Secondary | ICD-10-CM

## 2022-08-04 DIAGNOSIS — D649 Anemia, unspecified: Secondary | ICD-10-CM | POA: Insufficient documentation

## 2022-08-04 NOTE — Progress Notes (Signed)
[place] [facility]  Initial Assessment   Leah Padilla is a 61 y.o. year old female contacted by phone. Clinical Social Work was referred by medical provider for assessment of psychosocial needs.   SDOH (Social Determinants of Health) assessments performed: Yes   SDOH Screenings   Food Insecurity: No Food Insecurity (07/27/2022)  Housing: Low Risk  (07/27/2022)  Transportation Needs: No Transportation Needs (07/27/2022)  Utilities: Not At Risk (07/27/2022)  Depression (PHQ2-9): Low Risk  (06/29/2022)  Tobacco Use: Medium Risk (08/01/2022)     Distress Screen completed: No     No data to display            Family/Social Information:  Housing Arrangement: patient lives with her adult son.   Per pt her son has mental health issues and does not work. Family members/support persons in your life? Pt has family residing close by that can offer support if she needs transportation or if she is not feeling well, but no one is able to offer financial assistance. Transportation concerns: no  Employment: Unemployed Pt was working at [employer], but was laid off at the beginning of October.  Pt's insurance benefits ended at the end of October and pt's income has been impacted since she started feeling sick in March and needed to use short term disability from March until July.  Income source: No income Financial concerns: Yes, current concerns Type of concern: Utilities, Rent/ mortgage, and Medical bills Food access concerns: no Religious or spiritual practice: No Services Currently in place:  Pt states she has struggled with anxiety and depression and has been seeing a therapist for approximately 10 years which she has found to be beneficial  Coping/ Adjustment to diagnosis: Patient understands treatment plan and what happens next? Pt has not yet been diagnosed which is causing a great deal of stress and anxiety as pt now is unemployed and does not know when she may or if  she may be able to return to work. Concerns about diagnosis and/or treatment: Overwhelmed by information and would like a definitive diagnosis Patient reported stressors: Insurance, Finances, Anxiety/ nervousness, and Feeling hopeless Hopes and/or priorities: Pt's priority is to find out what is wrong and start some form of treatment w/ the hope of feeling better. Patient enjoys  not addressed Current coping skills/ strengths: Capable of independent living , Motivation for treatment/growth , and Supportive family/friends     SUMMARY: Current SDOH Barriers:  Financial constraints related to loss of employment and insurance coverage  Clinical Social Work Clinical Goal(s):  Explore community resource options for unmet needs related to:  Financial Strain   Interventions: Discussed common feeling and emotions when being diagnosed with cancer, and the importance of support during treatment Informed patient of the support team roles and support services at [facility] Provided [facility] contact information and encouraged patient to call with any questions or concerns Provided patient with information about applying for Medicaid as well as open enrollment for ObamaCare   Follow Up Plan:  Once pt has received a diagnosis CSW to reach out to pt to see if additional supportive services are available.   Patient verbalizes understanding of plan: Yes    Henriette Combs, LCSW

## 2022-08-07 ENCOUNTER — Other Ambulatory Visit: Payer: Self-pay | Admitting: *Deleted

## 2022-08-07 DIAGNOSIS — R591 Generalized enlarged lymph nodes: Secondary | ICD-10-CM

## 2022-08-09 ENCOUNTER — Encounter (HOSPITAL_COMMUNITY): Payer: Self-pay | Admitting: Internal Medicine

## 2022-08-10 ENCOUNTER — Other Ambulatory Visit (HOSPITAL_COMMUNITY): Payer: 59

## 2022-08-10 ENCOUNTER — Other Ambulatory Visit: Payer: 59

## 2022-08-11 ENCOUNTER — Encounter (HOSPITAL_COMMUNITY): Payer: Self-pay | Admitting: Hematology

## 2022-08-16 ENCOUNTER — Ambulatory Visit: Payer: 59 | Admitting: Hematology

## 2022-08-16 ENCOUNTER — Ambulatory Visit: Payer: 59 | Admitting: Gastroenterology

## 2022-08-18 ENCOUNTER — Ambulatory Visit: Payer: 59 | Admitting: Family Medicine

## 2022-08-22 ENCOUNTER — Inpatient Hospital Stay: Payer: Self-pay

## 2022-08-22 ENCOUNTER — Encounter: Payer: Self-pay | Admitting: General Practice

## 2022-08-22 ENCOUNTER — Encounter (HOSPITAL_COMMUNITY): Payer: Self-pay | Admitting: Hematology

## 2022-08-22 ENCOUNTER — Encounter: Payer: Self-pay | Admitting: *Deleted

## 2022-08-22 ENCOUNTER — Other Ambulatory Visit: Payer: Self-pay | Admitting: *Deleted

## 2022-08-22 ENCOUNTER — Inpatient Hospital Stay: Payer: Self-pay | Admitting: Hematology

## 2022-08-22 DIAGNOSIS — R591 Generalized enlarged lymph nodes: Secondary | ICD-10-CM

## 2022-08-22 NOTE — Progress Notes (Signed)
Markus Daft, MD  Allen Kell, NT Ok to schedule US guided right inguinal lymph node biopsy.  Delton Coombes wants the prominent right inguinal lymph node biopsied, see recent PET, im 289/3.  See [facility] clinic note from 10/31.  [place]       Previous Messages    ----- Message ----- From: Allen Kell, NT Sent: 08/22/2022  10:19 AM EST To: [facility] Procedure Requests Subject: CT Biiopsy                                    Procedure: CT Biopsy  Reason: lymphadenopathy - left ingunal LN bx  History: NM PET in chart  Provider: Derek Jack, MD  Contact: [phone]

## 2022-08-23 ENCOUNTER — Inpatient Hospital Stay: Payer: Self-pay

## 2022-08-23 DIAGNOSIS — R591 Generalized enlarged lymph nodes: Secondary | ICD-10-CM

## 2022-08-23 LAB — COMPREHENSIVE METABOLIC PANEL
ALT: 33 U/L (ref 0–44)
AST: 34 U/L (ref 15–41)
Albumin: 3.9 g/dL (ref 3.5–5.0)
Alkaline Phosphatase: 103 U/L (ref 38–126)
Anion gap: 3 — ABNORMAL LOW (ref 5–15)
BUN: 17 mg/dL (ref 6–20)
CO2: 22 mmol/L (ref 22–32)
Calcium: 9.1 mg/dL (ref 8.9–10.3)
Chloride: 108 mmol/L (ref 98–111)
Creatinine, Ser: 1.86 mg/dL — ABNORMAL HIGH (ref 0.44–1.00)
GFR, Estimated: 31 mL/min — ABNORMAL LOW (ref >60)
Glucose, Bld: 109 mg/dL — ABNORMAL HIGH (ref 70–99)
Potassium: 4.3 mmol/L (ref 3.5–5.1)
Sodium: 133 mmol/L — ABNORMAL LOW (ref 135–145)
Total Bilirubin: 0.5 mg/dL (ref 0.3–1.2)
Total Protein: 8 g/dL (ref 6.5–8.1)

## 2022-09-05 ENCOUNTER — Other Ambulatory Visit (HOSPITAL_COMMUNITY): Payer: Self-pay | Admitting: Physician Assistant

## 2022-09-05 DIAGNOSIS — R591 Generalized enlarged lymph nodes: Secondary | ICD-10-CM

## 2022-09-06 ENCOUNTER — Other Ambulatory Visit: Payer: Self-pay

## 2022-09-06 ENCOUNTER — Ambulatory Visit (HOSPITAL_COMMUNITY)
Admission: RE | Admit: 2022-09-06 | Discharge: 2022-09-06 | Disposition: A | Discharge status: Home / Self Care | Payer: Medicaid Other | Source: Ambulatory Visit | Attending: Hematology | Admitting: Hematology

## 2022-09-06 ENCOUNTER — Encounter (HOSPITAL_COMMUNITY): Payer: Self-pay

## 2022-09-06 DIAGNOSIS — I889 Nonspecific lymphadenitis, unspecified: Secondary | ICD-10-CM | POA: Insufficient documentation

## 2022-09-06 DIAGNOSIS — R590 Localized enlarged lymph nodes: Secondary | ICD-10-CM | POA: Insufficient documentation

## 2022-09-06 DIAGNOSIS — R591 Generalized enlarged lymph nodes: Secondary | ICD-10-CM

## 2022-09-06 LAB — CBC
HCT: 38 % (ref 36.0–46.0)
Hemoglobin: 12.8 g/dL (ref 12.0–15.0)
MCH: 29.2 pg (ref 26.0–34.0)
MCHC: 33.7 g/dL (ref 30.0–36.0)
MCV: 86.6 fL (ref 80.0–100.0)
Platelets: 380 10*3/uL (ref 150–400)
RBC: 4.39 MIL/uL (ref 3.87–5.11)
RDW: 13.5 % (ref 11.5–15.5)
WBC: 8.7 10*3/uL (ref 4.0–10.5)
nRBC: 0 % (ref 0.0–0.2)

## 2022-09-06 LAB — PROTIME-INR
INR: 1 (ref 0.8–1.2)
Prothrombin Time: 12.8 seconds (ref 11.4–15.2)

## 2022-09-06 MED ORDER — LIDOCAINE HCL (PF) 1 % IJ SOLN
INTRAMUSCULAR | Status: AC
  Filled 2022-09-06: qty 30

## 2022-09-06 MED ORDER — FENTANYL CITRATE (PF) 100 MCG/2ML IJ SOLN
INTRAMUSCULAR | Status: AC | PRN
  Administered 2022-09-06: 25 ug via INTRAVENOUS
  Administered 2022-09-06: 50 ug via INTRAVENOUS

## 2022-09-06 MED ORDER — MIDAZOLAM HCL 2 MG/2ML IJ SOLN
INTRAMUSCULAR | Status: AC | PRN
  Administered 2022-09-06 (×2): 1 mg via INTRAVENOUS

## 2022-09-06 MED ORDER — MIDAZOLAM HCL 2 MG/2ML IJ SOLN
INTRAMUSCULAR | Status: AC
  Filled 2022-09-06: qty 2

## 2022-09-06 MED ORDER — FENTANYL CITRATE (PF) 100 MCG/2ML IJ SOLN
INTRAMUSCULAR | Status: AC
  Filled 2022-09-06: qty 2

## 2022-09-06 MED ORDER — SODIUM CHLORIDE 0.9 % IV SOLN: INTRAVENOUS | Status: DC

## 2022-09-06 MED ORDER — LIDOCAINE HCL (PF) 1 % IJ SOLN: 7.0000 mL | Freq: Once | INTRAMUSCULAR | Status: DC

## 2022-09-06 NOTE — H&P (Signed)
Chief Complaint: Patient was seen in consultation today for right inguinal lymph node biopsy at the request of [place]  Referring Physician(s): Katragadda,Sreedhar  Supervising Physician: Daryll Brod  Patient Status: [facility] - Out-pt  History of Present Illness: Leah Padilla is a 62 y.o. female   No known cancer Hypercalcemia initiated work up   Right axillary lymph node biopsy (01/17/2022): Noncaseating granulomatous inflammation.  No significant staining for CD30.  Predominant T-cell population.  CD68 highlights increased histiocytes.  HHV 8 and EBV by ISH negative. - PET scan (02/02/2022): Mild hypermetabolic lymphadenopathy throughout the neck, chest, abdomen and pelvis.  SUV value in the abdomen and pelvis range between 4.2-4.5.  Dr Delton Coombes note 08/01/22:  Diffuse lymphadenopathy from noncaseating granuloma: - She does not have any B symptoms. - I have reviewed PET scan from 07/27/2022.  Most of the lymph nodes have decreased in size and SUV.  Right inguinal lymph node measures 1 cm with SUV 5.4, previously 3.6. 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 - Based on the PET scan findings and recurrent hypercalcemia, I have recommended additional tissue sampling.  As her right inguinal lymph node has a high SUV, I have recommended biopsy of that particular lymph node.  Scheduled now for Rt inguinal LN bx  Past Medical History:  Diagnosis Date   Anxiety    Bell's palsy    Depression    High cholesterol    Hypertension     Past Surgical History:  Procedure Laterality Date   AXILLARY LYMPH NODE BIOPSY Right 01/17/2022   Procedure: AXILLARY LYMPH NODE BIOPSY;  Surgeon: Rusty Aus, DO;  Location: [facility] ORS;  Service: General;  Laterality: Right;   BIOPSY  08/01/2022   Procedure: BIOPSY;  Surgeon: Eloise Harman, DO;  Location: [facility] ENDO [facility];  Service: Endoscopy;;   COLONOSCOPY N/A 04/17/2013   Procedure:  COLONOSCOPY;  Surgeon: Rogene Houston, MD;  Location: [facility] END[facility];  Service: Endoscopy;  Laterality: N/A;  930   COLONOSCOPY WITH PROPOFOL N/A 08/01/2022   Procedure: COLONOSCOPY WITH PROPOFOL;  Surgeon: Eloise Harman, DO;  Location: [facility];  Service: Endoscopy;  Laterality: N/A;  1:30 pm   CYST EXCISION     neck    Allergies: Doxycycline, Penicillins, Ciprofloxacin, Estrace [estradiol], Nitrofurantoin, Polymox [amoxicillin], and Progesterone  Medications: Prior to Admission medications   Medication Sig Start Date End Date Taking? Authorizing Provider  acetaminophen (TYLENOL) 500 MG tablet Take 1,000 mg by mouth every 6 (six) hours as needed for moderate pain.   Yes [provider]  ALPRAZolam Duanne Moron) 1 MG tablet Take 1 mg by mouth 4 (four) times daily as needed for anxiety.  09/08/15  Yes [provider]  amLODipine (NORVASC) 10 MG tablet Take 1 tablet (10 mg total) by mouth daily. 01/20/22  Yes Little Ishikawa, MD  fexofenadine (ALLEGRA) 180 MG tablet Take 180 mg by mouth daily as needed for allergies or rhinitis.   Yes [provider]  imipramine (TOFRANIL) 50 MG tablet Take 50 mg by mouth at bedtime.   Yes [provider]  metoprolol tartrate (LOPRESSOR) 25 MG tablet Take 1 tablet (25 mg total) by mouth 2 (two) times daily. 01/19/22  Yes Little Ishikawa, MD  Multiple Vitamin (MULTIVITAMIN) tablet Take 1 tablet by mouth daily.   Yes [provider]  polyethylene glycol (MIRALAX / GLYCOLAX) 17 g packet Take 17 g by mouth daily as needed for moderate constipation.   Yes  [provider]  Probiotic Product (PROBIOTIC PO) Take 1 capsule by mouth daily.   Yes [provider]  rOPINIRole (REQUIP) 0.5 MG tablet TAKE 1 TABLET(0.5 MG) BY MOUTH AT BEDTIME 07/26/22  Yes Alvira Monday, FNP  simvastatin (ZOCOR) 20 MG tablet Take 20 mg by mouth daily in the afternoon.   Yes [provider]  potassium chloride  SA (KLOR-CON M) 20 MEQ tablet Take 2 tablets (40 mEq total) by mouth daily for 4 days. Patient not taking: Reported on 09/01/2022 07/30/22 09/01/22  Barton Dubois, MD  triamcinolone cream (KENALOG) 0.1 % Apply 1 Application topically 2 (two) times daily. Patient not taking: Reported on 09/01/2022 06/30/22   Lindell Spar, MD     Family History  Problem Relation Age of Onset   Multiple myeloma Mother    Stroke Father    Breast cancer Sister    Stroke Other    Diabetes Other    Colon cancer Neg Hx     Social History   Socioeconomic History   Marital status: Divorced    Spouse name: Not on file   Number of children: 2   Years of education: Not on file   Highest education level: Not on file  Occupational History   Not on file  Tobacco Use   Smoking status: Former    Packs/day: 0.50    Years: 13.00    Total pack years: 6.50    Types: Cigarettes    Quit date: 12/13/2016    Years since quitting: 5.7   Smokeless tobacco: Never  Vaping Use   Vaping Use: Former  Substance and Sexual Activity   Alcohol use: Not Currently    Comment: occasional   Drug use: Yes    Frequency: 3.0 times per week    Types: Marijuana    Comment: last used 07/30/22   Sexual activity: Yes    Birth control/protection: Post-menopausal  Other Topics Concern   Not on file  Social History Narrative   Not on file   Social Determinants of Health   Financial Resource Strain: High Risk (08/04/2022)   Overall Financial Resource Strain (CARDIA)    Difficulty of Paying Living Expenses: Hard  Food Insecurity: No Food Insecurity (07/27/2022)   Hunger Vital Sign    Worried About Running Out of Food in the Last Year: Never true    [place] in the Last Year: Never true  Transportation Needs: No Transportation Needs (07/27/2022)   PRAPARE - Hydrologist (Medical): No    Lack of Transportation (Non-Medical): No  Physical Activity: Not on file  Stress: Not on file  Social  Connections: Not on file    Review of Systems: A 12 point ROS discussed and pertinent positives are indicated in the HPI above.  All other systems are negative.  Review of Systems  Constitutional:  Positive for fatigue. Negative for activity change and fever.  Respiratory:  Negative for cough and shortness of breath.   Cardiovascular:  Negative for chest pain.  Gastrointestinal:  Negative for abdominal pain.  Psychiatric/Behavioral:  Negative for behavioral problems and confusion.     Vital Signs: BP 131/86   Pulse 89   Temp 97.9 F (36.6 C)   Ht _0  (1.727 m)   Wt 127 lb (57.6 kg)   SpO2 100%   BMI 19.31 kg/m     Physical Exam Vitals reviewed.  HENT:     Mouth/Throat:     Mouth:  Mucous membranes are moist.  Cardiovascular:     Rate and Rhythm: Normal rate.     Heart sounds: Normal heart sounds.  Pulmonary:     Effort: Pulmonary effort is normal.     Breath sounds: Normal breath sounds.  Abdominal:     Palpations: Abdomen is soft.  Musculoskeletal:        General: Normal range of motion.  Skin:    General: Skin is warm.  Neurological:     Mental Status: She is alert and oriented to person, place, and time.  Psychiatric:        Behavior: Behavior normal.     Imaging: No results found.  Labs:  CBC: Recent Labs    07/27/22 1410 07/28/22 0444 07/30/22 0544 09/06/22 0703  WBC 11.2* 7.6 8.5 8.7  HGB 12.6 9.8* 9.9* 12.8  HCT 37.3 29.2* 30.4* 38.0  PLT 474* 363 357 380    COAGS: Recent Labs    09/06/22 0703  INR 1.0    BMP: Recent Labs    07/28/22 0806 07/28/22 1208 07/28/22 1619 07/29/22 0614 07/29/22 1218 07/30/22 0544 08/23/22 1117  NA 138 138  --   --   --  140 133*  K 3.6 3.8  --   --   --  3.0* 4.3  CL 114* 116*  --   --   --  119* 108  CO2 18* 18*  --   --   --  17* 22  GLUCOSE 100* 94  --   --   --  81 109*  BUN 17 16  --   --   --  11 17  CALCIUM 10.2 10.0   < > 9.3 8.8* 8.5* 9.1  CREATININE 2.18* 2.32*  --   --   --   1.95* 1.86*  GFRNONAA 25* 23*  --   --   --  29* 31*   < > = values in this interval not displayed.    LIVER FUNCTION TESTS: Recent Labs    07/27/22 1410 07/27/22 1700 07/30/22 0544 08/23/22 1117  BILITOT 0.5 0.4 0.3 0.5  AST 34 26 28 34  ALT _0 33  ALKPHOS 157* 136* 123 103  PROT 8.5* 7.3 6.2* 8.0  ALBUMIN 3.5 3.3* 2.7* 3.9    TUMOR MARKERS: No results for input(s): "AFPTM", "CEA", "CA199", "CHROMGRNA" in the last 8760 hours.  Assessment and Plan:  Hypercalcemia Extensive lymphadenopathy Rt axillary LN was biopsied 12/2016; granulomatous Now for right inguinal lymph node biopsy Risks and benefits of right inguinal biopsy was discussed with the patient and/or patient's family including, but not limited to bleeding, infection, damage to adjacent structures or low yield requiring additional tests.  All of the questions were answered and there is agreement to proceed. Consent signed and in chart.  Thank you for this interesting consult.  I greatly enjoyed meeting Leah Padilla and look forward to participating in their care.  A copy of this report was sent to the requesting provider on this date.  Electronically Signed: Lavonia Drafts, PA-C 09/06/2022, 8:02 AM   I spent a total of  30 Minutes   in face to face in clinical consultation, greater than 50% of which was counseling/coordinating care for right inguinal LN Bx

## 2022-09-06 NOTE — Procedures (Signed)
Interventional Radiology Procedure Note  Procedure: [place] CORE BX RIGHT INGUINAL ADENOPATHY    Complications: None  Estimated Blood Loss:  0  Findings: 4 18 G CORES IN SALINE    Leah Punt, MD

## 2022-09-07 LAB — SURGICAL PATHOLOGY

## 2022-09-14 ENCOUNTER — Inpatient Hospital Stay: Payer: Medicaid Other | Admitting: Licensed Clinical Social Worker

## 2022-09-14 ENCOUNTER — Inpatient Hospital Stay: Payer: Medicaid Other | Attending: Hematology | Admitting: Hematology

## 2022-09-14 VITALS — BP 123/80 | HR 82 | Temp 97.1°F | Resp 18 | Wt 126.5 lb

## 2022-09-14 DIAGNOSIS — Z87891 Personal history of nicotine dependence: Secondary | ICD-10-CM | POA: Diagnosis not present

## 2022-09-14 DIAGNOSIS — N189 Chronic kidney disease, unspecified: Secondary | ICD-10-CM | POA: Diagnosis not present

## 2022-09-14 DIAGNOSIS — R591 Generalized enlarged lymph nodes: Secondary | ICD-10-CM

## 2022-09-14 DIAGNOSIS — Z79899 Other long term (current) drug therapy: Secondary | ICD-10-CM | POA: Insufficient documentation

## 2022-09-14 DIAGNOSIS — D649 Anemia, unspecified: Secondary | ICD-10-CM | POA: Diagnosis not present

## 2022-09-14 NOTE — Progress Notes (Signed)
[place] CSW Progress Note  Holiday representative met with patient and her daughter to obtain signature for [facility] referral.  Signature obtained and referral sent on behalf of pt.  Pt requested CSW check on her [employer] insurance as it is paying her loan while she is unable to work and her medical excuse is ending today.  CSW contacted [employer] at [phone] and spoke with Deatra Canter.  Pt's claim number is [id number].  Per Deatra Canter since pt will be receiving treatment at [facility] she will need to book an appointment with them and contact [employer] with that appointment date in order to extend coverage.  Forestine Na can not submit documentation to extend pt's medical excuse as pt will not be treated at [facility].  CSW informed pt of the above.     Henriette Combs, LCSW

## 2022-09-14 NOTE — Progress Notes (Signed)
[address] [place], [place] [address]   CLINIC:  Medical Oncology/Hematology  PCP:  Alvira Monday, [phone] / [place] [address]  [phone]  REASON FOR VISIT:  Follow-up for diffuse lymphadenopathy and malignant hypercalcemia  PRIOR THERAPY: none  CURRENT THERAPY: Surveillance  INTERVAL HISTORY:  Ms. Alleta Avery, a 61 y.o. female, seen for follow-up of generalized lymphadenopathy, hypercalcemia.  She had a repeat right inguinal lymph node biopsy done on 09/06/2022.  Reports energy levels are 50%.  REVIEW OF SYSTEMS:  Review of Systems  Constitutional:  Negative for appetite change, fatigue, fever and unexpected weight change.  Respiratory:  Positive for shortness of breath (On exertion).   Gastrointestinal:  Positive for constipation and nausea.  Endocrine: Negative for hot flashes.  Neurological:  Positive for dizziness.  Psychiatric/Behavioral:  Positive for depression. The patient is nervous/anxious.   All other systems reviewed and are negative.   PAST MEDICAL/SURGICAL HISTORY:  Past Medical History:  Diagnosis Date   Anxiety    Bell's palsy    Depression    High cholesterol    Hypertension    Past Surgical History:  Procedure Laterality Date   AXILLARY LYMPH NODE BIOPSY Right 01/17/2022   Procedure: AXILLARY LYMPH NODE BIOPSY;  Surgeon: Rusty Aus, DO;  Location: [facility];  Service: General;  Laterality: Right;   BIOPSY  08/01/2022   Procedure: BIOPSY;  Surgeon: Eloise Harman, DO;  Location[facility];  Service: Endoscopy;;   COLONOSCOPY N/A 04/17/2013   Procedure: COLONOSCOPY;  Surgeon: Rogene Houston, MD;  Location: [facility];  Service: Endoscopy;  Laterality: N/A;  930   COLONOSCOPY WITH PROPOFOL N/A 08/01/2022   Procedure: COLONOSCOPY WITH PROPOFOL;  Surgeon: Eloise Harman, DO;  Location: [facility];  Service: Endoscopy;  Laterality: N/A;  1:30 pm   CYST EXCISION     neck     SOCIAL HISTORY:  Social History   Socioeconomic History   Marital status: Divorced    Spouse name: Not on file   Number of children: 2   Years of education: Not on file   Highest education level: Not on file  Occupational History   Not on file  Tobacco Use   Smoking status: Former    Packs/day: 0.50    Years: 13.00    Total pack years: 6.50    Types: Cigarettes    Quit date: 12/13/2016    Years since quitting: 5.7   Smokeless tobacco: Never  Vaping Use   Vaping Use: Former  Substance and Sexual Activity   Alcohol use: Not Currently    Comment: occasional   Drug use: Yes    Frequency: 3.0 times per week    Types: Marijuana    Comment: last used 07/30/22   Sexual activity: Yes    Birth control/protection: Post-menopausal  Other Topics Concern   Not on file  Social History Narrative   Not on file   Social Determinants of Health   Financial Resource Strain: High Risk (08/04/2022)   Overall Financial Resource Strain (CARDIA)    Difficulty of Paying Living Expenses: Hard  Food Insecurity: No Food Insecurity (07/27/2022)   Hunger Vital Sign    Worried About Running Out of Food in the Last Year: Never true    Ran Out of Food in the Last Year: Never true  Transportation Needs: No Transportation Needs (07/27/2022)   PRAPARE - Transportation    Lack of Transportation (  Medical): No    Lack of Transportation (Non-Medical): No  Physical Activity: Not on file  Stress: Not on file  Social Connections: Not on file  Intimate Partner Violence: Not At Risk (07/27/2022)   Humiliation, Afraid, Rape, and Kick questionnaire    Fear of Current or Ex-Partner: No    Emotionally Abused: No    Physically Abused: No    Sexually Abused: No    FAMILY HISTORY:  Family History  Problem Relation Age of Onset   Multiple myeloma Mother    Stroke Father    Breast cancer Sister    Stroke Other    Diabetes Other    Colon cancer Neg Hx     CURRENT MEDICATIONS:  Current Outpatient  Medications  Medication Sig Dispense Refill   acetaminophen (TYLENOL) 500 MG tablet Take 1,000 mg by mouth every 6 (six) hours as needed for moderate pain.     ALPRAZolam (XANAX) 1 MG tablet Take 1 mg by mouth 4 (four) times daily as needed for anxiety.      amLODipine (NORVASC) 10 MG tablet Take 1 tablet (10 mg total) by mouth daily. 30 tablet 0   fexofenadine (ALLEGRA) 180 MG tablet Take 180 mg by mouth daily as needed for allergies or rhinitis.     imipramine (TOFRANIL) 50 MG tablet Take 50 mg by mouth at bedtime.     metoprolol tartrate (LOPRESSOR) 25 MG tablet Take 1 tablet (25 mg total) by mouth 2 (two) times daily. 60 tablet 0   Multiple Vitamin (MULTIVITAMIN) tablet Take 1 tablet by mouth daily.     polyethylene glycol (MIRALAX / GLYCOLAX) 17 g packet Take 17 g by mouth daily as needed for moderate constipation.     potassium chloride SA (KLOR-CON M) 20 MEQ tablet Take 2 tablets (40 mEq total) by mouth daily for 4 days. (Patient not taking: Reported on 09/01/2022) 8 tablet 0   Probiotic Product (PROBIOTIC PO) Take 1 capsule by mouth daily.     rOPINIRole (REQUIP) 0.5 MG tablet TAKE 1 TABLET(0.5 MG) BY MOUTH AT BEDTIME 90 tablet 2   simvastatin (ZOCOR) 20 MG tablet Take 20 mg by mouth daily in the afternoon.     triamcinolone cream (KENALOG) 0.1 % Apply 1 Application topically 2 (two) times daily. (Patient not taking: Reported on 09/01/2022) 30 g 0   No current facility-administered medications for this visit.    ALLERGIES:  Allergies  Allergen Reactions   Doxycycline Nausea Only    Respiratory Distress, nausea, loss of appetite   Penicillins Hives and Nausea Only   Ciprofloxacin Other (See Comments)    Dry mouth, skin tingling, panicky, stomach pain, anxious and nervous    Estrace [Estradiol]     Vaginal discharge    Nitrofurantoin Other (See Comments)    Chills and hot flashes    Polymox [Amoxicillin] Hives   Progesterone     Burning / vaginal discharge     PHYSICAL EXAM:   Performance status (ECOG): 1 - Symptomatic but completely ambulatory  There were no vitals filed for this visit.  Wt Readings from Last 3 Encounters:  09/06/22 127 lb (57.6 kg)  08/01/22 125 lb 12.8 oz (57.1 kg)  07/28/22 134 lb 0.6 oz (60.8 kg)   Physical Exam Vitals reviewed.  Constitutional:      Appearance: Normal appearance.  Cardiovascular:     Rate and Rhythm: Normal rate and regular rhythm.     Pulses: Normal pulses.     Heart sounds: Normal heart  sounds.  Pulmonary:     Effort: Pulmonary effort is normal.     Breath sounds: Normal breath sounds.  Neurological:     General: No focal deficit present.     Mental Status: She is alert and oriented to person, place, and time.  Psychiatric:        Mood and Affect: Mood normal.        Behavior: Behavior normal.    LABORATORY DATA:  I have reviewed the labs as listed.     Latest Ref Rng & Units 09/06/2022    7:03 AM 07/30/2022    5:44 AM 07/28/2022    4:44 AM  CBC  WBC 4.0 - 10.5 K/uL 8.7  8.5  7.6   Hemoglobin 12.0 - 15.0 g/dL 12.8  9.9  9.8   Hematocrit 36.0 - 46.0 % 38.0  30.4  29.2   Platelets 150 - 400 K/uL 380  357  363       Latest Ref Rng & Units 08/23/2022   11:17 AM 07/30/2022    5:44 AM 07/29/2022   12:18 PM  CMP  Glucose 70 - 99 mg/dL 109  81    BUN 6 - 20 mg/dL 17  11    Creatinine 0.44 - 1.00 mg/dL 1.86  1.95    Sodium 135 - 145 mmol/L 133  140    Potassium 3.5 - 5.1 mmol/L 4.3  3.0    Chloride 98 - 111 mmol/L 108  119    CO2 22 - 32 mmol/L 22  17    Calcium 8.9 - 10.3 mg/dL 9.1  8.5  8.8   Total Protein 6.5 - 8.1 g/dL 8.0  6.2    Total Bilirubin 0.3 - 1.2 mg/dL 0.5  0.3    Alkaline Phos 38 - 126 U/L 103  123    AST 15 - 41 U/L 34  28    ALT 0 - 44 U/L 33  25        Component Value Date/Time   RBC 4.39 09/06/2022 0703   MCV 86.6 09/06/2022 0703   MCH 29.2 09/06/2022 0703   MCHC 33.7 09/06/2022 0703   RDW 13.5 09/06/2022 0703   LYMPHSABS 1.2 07/27/2022 1410   MONOABS 1.2 (H)  07/27/2022 1410   EOSABS 0.2 07/27/2022 1410   BASOSABS 0.1 07/27/2022 1410    DIAGNOSTIC IMAGING:  I have independently reviewed the scans and discussed with the patient. [place] CORE BIOPSY (LYMPH NODES)  Result Date: 09/06/2022 INDICATION: Right inguinal PET positive adenopathy, hypercalcemia EXAM: ULTRASOUND CORE BIOPSY RIGHT INGUINAL ADENOPATHY MEDICATIONS: 1% LIDOCAINE LOCAL ANESTHESIA/SEDATION: Moderate (conscious) sedation was employed during this procedure. A total of Versed 2.0 mg and Fentanyl 75 mcg was administered intravenously by the radiology nurse. Total intra-service moderate Sedation Time: 10 minutes. The patient's level of consciousness and vital signs were monitored continuously by radiology nursing throughout the procedure under my direct supervision. COMPLICATIONS: None immediate. PROCEDURE: Informed written consent was obtained from the patient after a thorough discussion of the procedural risks, benefits and alternatives. All questions were addressed. Maximal Sterile Barrier Technique was utilized including caps, mask, sterile gowns, sterile gloves, sterile drape, hand hygiene and skin antiseptic. A timeout was performed prior to the initiation of the procedure. Previous imaging reviewed. Preliminary ultrasound performed. The largest right inguinal superficial lymph node was localized and marked. This was correlated with the PET scan. Under sterile conditions and local anesthesia, 18 gauge core biopsy needle was advanced to the lymph node under direct  ultrasound. 4 18 gauge core biopsies were obtained. These were intact and non fragmented. Samples placed in saline. Postprocedure imaging demonstrates no hemorrhage or hematoma. Patient tolerated biopsy well. IMPRESSION: Successful ultrasound core biopsy of the right inguinal adenopathy Electronically Signed   By: Jerilynn Mages.  Shick M.D.   On: 09/06/2022 11:20     ASSESSMENT:  1.  Generalized lymphadenopathy and malignant hypercalcemia: -  Presentation with calcium level 13.4, creatinine 2.43, albumin 3.6. - Status post IV hydration and Zometa. - Calcium today improved to 11.1 and creatinine 1.8.  Albumin is 2.8. - 25-hydroxy vitamin D and one 25-hydroxy vitamin D within normal limits.  PTH was low at 12. - PTH RP is less than 2.  ACE level elevated at 116. - CT CAP mild bilateral hilar and subpectoral lymphadenopathy.  Right axillary lymph node 1.5 cm.  Multiple subcentimeter mediastinal lymph nodes.  Mild abdominal lymphadenopathy in the retroperitoneum and small bowel mesentery.  Mild pelvic lymphadenopathy seen throughout the iliac chains and bilateral inguinal region.  Spleen is normal. - Right axillary lymph node biopsy (01/17/2022): Noncaseating granulomatous inflammation.  No significant staining for CD30.  Predominant T-cell population.  CD68 highlights increased histiocytes.  HHV 8 and EBV by ISH negative. - PET scan (02/02/2022): Mild hypermetabolic lymphadenopathy throughout the neck, chest, abdomen and pelvis.  SUV value in the abdomen and pelvis range between 4.2-4.5. - Right inguinal lymph node core biopsy on 09/06/2022 - Pathology (09/06/2022): Granulomatous lymphadenitis.  Lymph node involved by abundant noncaseating well-circumscribed epithelioid type granulomas.  Findings similar to previous right axillary lymph node biopsy.  No evidence of Reed-Sternberg cells.   2.  Acute kidney injury: - Creatinine on admission 2.4.  Improved to 1.8 today with hydration and Zometa.  3.  Social/family history: -She lives at home with her son.  She works at a [employer] in [place].  She has exposure to acetone and IPA.  She quit smoking more than 5 years ago. - No family history of sarcoidosis.  Mother has multiple myeloma.  No other malignancies.   PLAN:  1.  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 - She had couple of admissions with malignant hypercalcemia. - We  reviewed biopsy from 09/06/2022 of the right inguinal lymph node.  It shows lymph node involved by abundant noncaseating well-circumscribed epithelioid type granulomas.  This is similar to previous right axillary lymph node biopsy.  Differential diagnosis includes sarcoidosis.  No evidence of Hodgkin's or non-Hodgkin's lymphoma. - There is no evidence of lymphoma.  Hence I will make a referral to sarcoidosis clinic at [facility].  RTC as needed.   2.  Hypercalcemia of malignancy: - Calcium level is 9.1 on 08/23/2022.  3.  CKD: - She had elevated calcium and creatinine levels due to AKI from hypercalcemia.  Last creatinine is 1.86.  4.  Normocytic anemia: - Last Venofer infusion on 03/10/2022.  Latest hemoglobin is 12.8.  Orders placed this encounter:  No orders of the defined types were placed in this encounter.    Derek Jack, MD [place] [phone]

## 2022-09-15 ENCOUNTER — Encounter (HOSPITAL_COMMUNITY): Payer: Self-pay | Admitting: Hematology

## 2022-09-21 ENCOUNTER — Encounter: Payer: Self-pay | Admitting: *Deleted

## 2022-09-21 ENCOUNTER — Encounter (HOSPITAL_COMMUNITY): Payer: Self-pay | Admitting: Hematology

## 2022-09-21 ENCOUNTER — Ambulatory Visit (INDEPENDENT_AMBULATORY_CARE_PROVIDER_SITE_OTHER): Payer: 59 | Admitting: Gastroenterology

## 2022-09-21 ENCOUNTER — Other Ambulatory Visit: Payer: Self-pay | Admitting: Licensed Clinical Social Worker

## 2022-10-09 ENCOUNTER — Encounter (HOSPITAL_COMMUNITY): Payer: Self-pay | Admitting: Hematology

## 2022-10-23 ENCOUNTER — Encounter (HOSPITAL_COMMUNITY): Payer: Self-pay | Admitting: Hematology

## 2022-10-24 ENCOUNTER — Ambulatory Visit (INDEPENDENT_AMBULATORY_CARE_PROVIDER_SITE_OTHER): Payer: Medicaid Other | Admitting: Family Medicine

## 2022-10-24 ENCOUNTER — Encounter: Payer: Self-pay | Admitting: Family Medicine

## 2022-10-24 VITALS — BP 97/67 | HR 100 | Ht 68.5 in | Wt 129.1 lb

## 2022-10-24 DIAGNOSIS — R350 Frequency of micturition: Secondary | ICD-10-CM | POA: Diagnosis not present

## 2022-10-24 DIAGNOSIS — N3090 Cystitis, unspecified without hematuria: Secondary | ICD-10-CM

## 2022-10-24 DIAGNOSIS — N3000 Acute cystitis without hematuria: Secondary | ICD-10-CM

## 2022-10-24 DIAGNOSIS — R300 Dysuria: Secondary | ICD-10-CM | POA: Diagnosis not present

## 2022-10-24 LAB — POCT URINALYSIS DIP (CLINITEK)
Bilirubin, UA: NEGATIVE
Glucose, UA: 100 mg/dL — AB
Ketones, POC UA: NEGATIVE mg/dL
Nitrite, UA: NEGATIVE
POC PROTEIN,UA: NEGATIVE
Spec Grav, UA: <=1.005 — AB (ref 1.010–1.025)
Urobilinogen, UA: 0.2 E.U./dL
pH, UA: 5.5 (ref 5.0–8.0)

## 2022-10-24 MED ORDER — SIMVASTATIN 20 MG PO TABS: 20.0000 mg | ORAL_TABLET | Freq: Every day | ORAL | 3 refills | Status: DC

## 2022-10-24 MED ORDER — PHENAZOPYRIDINE HCL 200 MG PO TABS: 200.0000 mg | ORAL_TABLET | Freq: Three times a day (TID) | ORAL | 0 refills | Status: AC | PRN

## 2022-10-24 MED ORDER — SULFAMETHOXAZOLE-TRIMETHOPRIM 800-160 MG PO TABS: 1.0000 | ORAL_TABLET | Freq: Two times a day (BID) | ORAL | 0 refills | Status: AC

## 2022-10-24 MED ORDER — AMLODIPINE BESYLATE 10 MG PO TABS: 10.0000 mg | ORAL_TABLET | Freq: Every day | ORAL | 0 refills | Status: DC

## 2022-10-24 NOTE — Patient Instructions (Addendum)
I appreciate the opportunity to provide care to you today!    Follow up:  4 months  Please complete the full course of the antibiotics   You can help prevent UTIs by doing the following:  -Avoid holding urine for prolonged periods; this stretches the bladder and causes bacteria to form because bacteria like warm and wet environments to grow -Empty the bladder as soon as the need arises.  -Empty your bladder soon after intercourse.  -Take showers instead of baths -Wipe front to back; doing so after urinating and after a bowel movement helps prevent bacteria in the anal region from spreading to the vagina and urethra. -Also, drink a full glass of water to help flush bacteria.   Pyridium can cause orange discoloration of the urine, I recommend wearing panty liners or pad to prevent staining of your undergarments    Please continue to a heart-healthy diet and increase your physical activities. Try to exercise for 30mns at least five times a week.      It was a pleasure to see you and I look forward to continuing to work together on your health and well-being. Please do not hesitate to call the office if you need care or have questions about your care.   Have a wonderful day and week. With Gratitude, GAlvira MondayMSN, FNP-BC

## 2022-10-24 NOTE — Progress Notes (Signed)
Acute Office Visit  Subjective:    Patient ID: Leah Padilla, female    DOB: 09/05/1961, 62 y.o.   MRN: [id number]  Chief Complaint  Patient presents with   Medication Refill    Needs refills on cholesterol and bp.   Urinary Tract Infection    Reports sx of uti since 10/10/2022.    HPI Patient is in today with complaints of urinary symptoms. For the details of today's visit, please refer to the assessment and plan.     Past Medical History:  Diagnosis Date   Anxiety    Bell's palsy    Depression    High cholesterol    Hypertension     Past Surgical History:  Procedure Laterality Date   AXILLARY LYMPH NODE BIOPSY Right 01/17/2022   Procedure: AXILLARY LYMPH NODE BIOPSY;  Surgeon: Rusty Aus, DO;  Location: [facility];  Service: General;  Laterality: Right;   BIOPSY  08/01/2022   Procedure: BIOPSY;  Surgeon: Eloise Harman, DO;  Location: [facility];  Service: Endoscopy;;   COLONOSCOPY N/A 04/17/2013   Procedure: COLONOSCOPY;  Surgeon: Rogene Houston, MD;  Location: [facility];  Service: Endoscopy;  Laterality: N/A;  930   COLONOSCOPY WITH PROPOFOL N/A 08/01/2022   Procedure: COLONOSCOPY WITH PROPOFOL;  Surgeon: Eloise Harman, DO;  Location: [facility];  Service: Endoscopy;  Laterality: N/A;  1:30 pm   CYST EXCISION     neck    Family History  Problem Relation Age of Onset   Multiple myeloma Mother    Stroke Father    Breast cancer Sister    Stroke Other    Diabetes Other    Colon cancer Neg Hx     Social History   Socioeconomic History   Marital status: Divorced    Spouse name: Not on file   Number of children: 2   Years of education: Not on file   Highest education level: Not on file  Occupational History   Not on file  Tobacco Use   Smoking status: Former    Packs/day: 0.50    Years: 13.00    Total pack years: 6.50    Types: Cigarettes    Quit date: 12/13/2016    Years since quitting: 5.8   Smokeless tobacco: Never   Vaping Use   Vaping Use: Former  Substance and Sexual Activity   Alcohol use: Not Currently    Comment: occasional   Drug use: Yes    Frequency: 3.0 times per week    Types: Marijuana    Comment: last used 07/30/22   Sexual activity: Yes    Birth control/protection: Post-menopausal  Other Topics Concern   Not on file  Social History Narrative   Not on file   Social Determinants of Health   Financial Resource Strain: High Risk (08/04/2022)   Overall Financial Resource Strain (CARDIA)    Difficulty of Paying Living Expenses: Hard  Food Insecurity: No Food Insecurity (07/27/2022)   Hunger Vital Sign    Worried About Running Out of Food in the Last Year: Never true    Ran Out of Food in the Last Year: Never true  Transportation Needs: No Transportation Needs (07/27/2022)   PRAPARE - Hydrologist (Medical): No    Lack of Transportation (Non-Medical): No  Physical Activity: Not on file  Stress: Not on file  Social Connections: Not on file  Intimate Partner Violence: Not At Risk (07/27/2022)  Humiliation, Afraid, Rape, and Kick questionnaire    Fear of Current or Ex-Partner: No    Emotionally Abused: No    Physically Abused: No    Sexually Abused: No    Outpatient Medications Prior to Visit  Medication Sig Dispense Refill   acetaminophen (TYLENOL) 500 MG tablet Take 1,000 mg by mouth every 6 (six) hours as needed for moderate pain.     ALPRAZolam (XANAX) 1 MG tablet Take 1 mg by mouth 4 (four) times daily as needed for anxiety.      fexofenadine (ALLEGRA) 180 MG tablet Take 180 mg by mouth daily as needed for allergies or rhinitis.     imipramine (TOFRANIL) 50 MG tablet Take 50 mg by mouth at bedtime.     metoprolol tartrate (LOPRESSOR) 25 MG tablet Take 1 tablet (25 mg total) by mouth 2 (two) times daily. 60 tablet 0   Multiple Vitamin (MULTIVITAMIN) tablet Take 1 tablet by mouth daily.     polyethylene glycol (MIRALAX / GLYCOLAX) 17 g packet  Take 17 g by mouth daily as needed for moderate constipation.     Probiotic Product (PROBIOTIC PO) Take 1 capsule by mouth daily.     rOPINIRole (REQUIP) 0.5 MG tablet TAKE 1 TABLET(0.5 MG) BY MOUTH AT BEDTIME 90 tablet 2   triamcinolone cream (KENALOG) 0.1 % Apply 1 Application topically 2 (two) times daily. 30 g 0   amLODipine (NORVASC) 10 MG tablet Take 1 tablet (10 mg total) by mouth daily. 30 tablet 0   simvastatin (ZOCOR) 20 MG tablet Take 20 mg by mouth daily in the afternoon.     potassium chloride SA (KLOR-CON M) 20 MEQ tablet Take 2 tablets (40 mEq total) by mouth daily for 4 days. (Patient not taking: Reported on 09/01/2022) 8 tablet 0   No facility-administered medications prior to visit.    Allergies  Allergen Reactions   Doxycycline Nausea Only    Respiratory Distress, nausea, loss of appetite   Penicillins Hives and Nausea Only   Ciprofloxacin Other (See Comments)    Dry mouth, skin tingling, panicky, stomach pain, anxious and nervous    Estrace [Estradiol]     Vaginal discharge    Nitrofurantoin Other (See Comments)    Chills and hot flashes    Polymox [Amoxicillin] Hives   Progesterone     Burning / vaginal discharge     Review of Systems  Constitutional:  Negative for chills and fever.  Eyes:  Negative for visual disturbance.  Respiratory:  Negative for chest tightness and shortness of breath.   Genitourinary:  Positive for dysuria.  Neurological:  Negative for dizziness and headaches.       Objective:    Physical Exam HENT:     Head: Normocephalic.     Mouth/Throat:     Mouth: Mucous membranes are moist.  Cardiovascular:     Rate and Rhythm: Normal rate.     Heart sounds: Normal heart sounds.  Pulmonary:     Effort: Pulmonary effort is normal.     Breath sounds: Normal breath sounds.  Abdominal:     Tenderness: There is no abdominal tenderness. There is no right CVA tenderness or left CVA tenderness.  Neurological:     Mental Status: She is  alert.     BP 97/67   Pulse 100   Ht 5' 8.5" (1.74 m)   Wt 129 lb 1.9 oz (58.6 kg)   SpO2 99%   BMI 19.35 kg/m  Wt Readings from Last  3 Encounters:  10/24/22 129 lb 1.9 oz (58.6 kg)  09/14/22 126 lb 8 oz (57.4 kg)  09/06/22 127 lb (57.6 kg)       Assessment & Plan:  Acute cystitis without hematuria Assessment & Plan: She complains of burning with urination She denies fever and chills UA shows a trace of leukocytes Will treat with Bactrim and Pyridium Encouraged to wear pads or pantyliners when taking Pyridium Inform patient that Pyridium can cause a red to orange discoloration of the urine Please complete the full course of the antibiotics   You can help prevent UTIs by doing the following:  -Avoid holding urine for prolonged periods; this stretches the bladder and causes bacteria to form because bacteria like warm and wet environments to grow -Empty the bladder as soon as the need arises.  -Empty your bladder soon after intercourse.  -Take showers instead of baths -Wipe front to back; doing so after urinating and after a bowel movement helps prevent bacteria in the anal region from spreading to the vagina and urethra. -Also, drink a full glass of water to help flush bacteria.    Urinary frequency -     POCT URINALYSIS DIP (CLINITEK) -     Sulfamethoxazole-Trimethoprim; Take 1 tablet by mouth 2 (two) times daily for 5 days.  Dispense: 10 tablet; Refill: 0  Cystitis -     Sulfamethoxazole-Trimethoprim; Take 1 tablet by mouth 2 (two) times daily for 5 days.  Dispense: 10 tablet; Refill: 0  Dysuria -     Phenazopyridine HCl; Take 1 tablet (200 mg total) by mouth 3 (three) times daily as needed for up to 2 days for pain.  Dispense: 6 tablet; Refill: 0  Other orders -     amLODIPine Besylate; Take 1 tablet (10 mg total) by mouth daily.  Dispense: 30 tablet; Refill: 0 -     Simvastatin; Take 1 tablet (20 mg total) by mouth daily in the afternoon.  Dispense: 30 tablet;  Refill: Purdy, FNP

## 2022-10-24 NOTE — Assessment & Plan Note (Signed)
She complains of burning with urination She denies fever and chills UA shows a trace of leukocytes Will treat with Bactrim and Pyridium Encouraged to wear pads or pantyliners when taking Pyridium Inform patient that Pyridium can cause a red to orange discoloration of the urine Please complete the full course of the antibiotics   You can help prevent UTIs by doing the following:  -Avoid holding urine for prolonged periods; this stretches the bladder and causes bacteria to form because bacteria like warm and wet environments to grow -Empty the bladder as soon as the need arises.  -Empty your bladder soon after intercourse.  -Take showers instead of baths -Wipe front to back; doing so after urinating and after a bowel movement helps prevent bacteria in the anal region from spreading to the vagina and urethra. -Also, drink a full glass of water to help flush bacteria.

## 2022-10-31 ENCOUNTER — Ambulatory Visit: Payer: 59 | Admitting: Gastroenterology

## 2022-11-02 DIAGNOSIS — D869 Sarcoidosis, unspecified: Secondary | ICD-10-CM | POA: Diagnosis not present

## 2022-11-08 DIAGNOSIS — D869 Sarcoidosis, unspecified: Secondary | ICD-10-CM | POA: Diagnosis not present

## 2022-11-08 DIAGNOSIS — R591 Generalized enlarged lymph nodes: Secondary | ICD-10-CM | POA: Diagnosis not present

## 2022-11-08 DIAGNOSIS — R739 Hyperglycemia, unspecified: Secondary | ICD-10-CM | POA: Diagnosis not present

## 2022-11-08 DIAGNOSIS — D860 Sarcoidosis of lung: Secondary | ICD-10-CM | POA: Diagnosis not present

## 2022-11-09 DIAGNOSIS — D869 Sarcoidosis, unspecified: Secondary | ICD-10-CM | POA: Diagnosis not present

## 2022-11-13 ENCOUNTER — Encounter (HOSPITAL_COMMUNITY): Payer: Self-pay | Admitting: Hematology

## 2022-11-20 ENCOUNTER — Other Ambulatory Visit: Payer: Self-pay | Admitting: Family Medicine

## 2022-11-20 ENCOUNTER — Telehealth: Payer: Self-pay

## 2022-11-20 NOTE — Telephone Encounter (Signed)
[employer] phone call was returned regarding the patient's disability [id number]. Claim reference number [id number].  They were requesting a return to work date.  They were told the patient was referred to [facility] for her cancer and treatment and they would need to call them for any paperwork for return to work dates.  Understanding was verbalized.

## 2022-11-27 ENCOUNTER — Encounter (HOSPITAL_COMMUNITY): Payer: Self-pay | Admitting: Hematology

## 2022-11-28 ENCOUNTER — Ambulatory Visit (INDEPENDENT_AMBULATORY_CARE_PROVIDER_SITE_OTHER): Payer: Medicaid Other | Admitting: Internal Medicine

## 2022-11-28 ENCOUNTER — Encounter: Payer: Self-pay | Admitting: Internal Medicine

## 2022-11-28 VITALS — BP 106/66 | HR 91 | Resp 16 | Ht 68.5 in | Wt 135.0 lb

## 2022-11-28 DIAGNOSIS — N3001 Acute cystitis with hematuria: Secondary | ICD-10-CM

## 2022-11-28 LAB — POCT URINALYSIS DIP (CLINITEK)
Bilirubin, UA: NEGATIVE
Glucose, UA: NEGATIVE mg/dL
Ketones, POC UA: NEGATIVE mg/dL
Nitrite, UA: NEGATIVE
POC PROTEIN,UA: NEGATIVE
Spec Grav, UA: 1.01 (ref 1.010–1.025)
Urobilinogen, UA: 0.2 E.U./dL
pH, UA: 7 (ref 5.0–8.0)

## 2022-11-28 MED ORDER — CEPHALEXIN 500 MG PO CAPS: 500.0000 mg | ORAL_CAPSULE | Freq: Two times a day (BID) | ORAL | 0 refills | Status: AC

## 2022-11-28 MED ORDER — CEPHALEXIN 500 MG PO CAPS: 500.0000 mg | ORAL_CAPSULE | Freq: Two times a day (BID) | ORAL | 0 refills | Status: DC

## 2022-11-28 NOTE — Progress Notes (Unsigned)
   HPI:[name] is a 62 y.o. female who presents for evaluation of UTI symptoms.. For the details of today's visit, please refer to the assessment and plan.  Patient has experienced 2 weeks of increased frequency and pain in her vagina.  She reports a normal Pap smear and vaginal exam in October.  She has had vaginal dryness and tried on estrogen but this did not improve her symptoms.  She has trace leukocytes today. Last urine culture 7 months ago grew Klebsiella aerogenes . she reports previously having these similar symptoms in having a urinary tract infection.  Physical Exam: Vitals:   11/28/22 1405  BP: 106/66  Pulse: 91  Resp: 16  SpO2: 99%  Weight: 135 lb (61.2 kg)  Height: 5' 8.5" (1.74 m)     Physical Exam   Assessment & Plan:   There are no diagnoses linked to this encounter.    Lorene Dy, MD

## 2022-11-28 NOTE — Patient Instructions (Signed)
Thank you, Leah Padilla for allowing [place] to provide your care today.   I have ordered the following labs for you:   Lab Orders         Urine Culture         POCT URINALYSIS DIP (CLINITEK)        - cephALEXin (KEFLEX) 500 MG capsule; Take 1 capsule (500 mg total) by mouth 2 (two) times daily for 5 days.  Dispense: 10 capsule; Refill: 0   Reminders: Follow-up if your symptoms worsen or fail to improve.  I will follow-up with the results of your urine culture.   Tamsen Snider, M.D.

## 2022-11-29 ENCOUNTER — Encounter: Payer: Self-pay | Admitting: Internal Medicine

## 2022-11-29 NOTE — Assessment & Plan Note (Signed)
Patient has experienced 2 weeks of increased frequency and pain in her vagina. Not sexually active. She reports a normal Pap smear and vaginal exam in October.  She has had vaginal dryness and tried on estrogen cream but this did not improve her symptoms. She is on prednisone for sarcoidosis, but no history of T2DM.  She says this feels like previous urinary tract infections. She has trace leukocytes today and microscopic hematuria. Last urine culture 7 months ago grew Klebsiella aerogenes. She was treated a month ago for UTI with Bactrim.   Plan - Urine Culture - Keflex -Patient deferred pelvic exam today, but encouraged to follow up with Ob/gyn or PCP if continuing to have pain in vagina.

## 2022-11-30 LAB — URINE CULTURE

## 2022-12-04 ENCOUNTER — Telehealth (INDEPENDENT_AMBULATORY_CARE_PROVIDER_SITE_OTHER): Payer: Medicaid Other | Admitting: Family Medicine

## 2022-12-04 ENCOUNTER — Encounter: Payer: Self-pay | Admitting: Family Medicine

## 2022-12-04 DIAGNOSIS — F439 Reaction to severe stress, unspecified: Secondary | ICD-10-CM | POA: Diagnosis not present

## 2022-12-04 NOTE — Progress Notes (Signed)
Virtual Visit via Video Note  I connected with Leah Padilla on 12/04/22 at 11:20 AM EST by a video enabled telemedicine application and verified that I am speaking with the correct person using two identifiers.  Patient Location: Home Provider Location: Office/Clinic  I discussed the limitations, risks, security, and privacy concerns of performing an evaluation and management service by video and the availability of in person appointments. I also discussed with the patient that there may be a patient responsible charge related to this service. The patient expressed understanding and agreed to proceed.  Subjective: PCP: Alvira Monday, FNP  Chief Complaint  Patient presents with   Anxiety    Pt needs a therapist would like a referral.    HPI The patient would like a referral to our therapists.  She denies suicidal ideation and thoughts.  She reports increased life stresses.  ROS: Per HPI  Current Outpatient Medications:    acetaminophen (TYLENOL) 500 MG tablet, Take 1,000 mg by mouth every 6 (six) hours as needed for moderate pain., Disp: , Rfl:    ALPRAZolam (XANAX) 1 MG tablet, Take 1 mg by mouth 4 (four) times daily as needed for anxiety. , Disp: , Rfl:    amLODipine (NORVASC) 10 MG tablet, TAKE 1 TABLET(10 MG) BY MOUTH DAILY, Disp: 30 tablet, Rfl: 0   cephALEXin (KEFLEX) 500 MG capsule, Take 1 capsule (500 mg total) by mouth 2 (two) times daily for 7 days., Disp: 14 capsule, Rfl: 0   fexofenadine (ALLEGRA) 180 MG tablet, Take 180 mg by mouth daily as needed for allergies or rhinitis., Disp: , Rfl:    imipramine (TOFRANIL) 50 MG tablet, Take 50 mg by mouth at bedtime., Disp: , Rfl:    metoprolol tartrate (LOPRESSOR) 25 MG tablet, Take 1 tablet (25 mg total) by mouth 2 (two) times daily., Disp: 60 tablet, Rfl: 0   Multiple Vitamin (MULTIVITAMIN) tablet, Take 1 tablet by mouth daily., Disp: , Rfl:    polyethylene glycol (MIRALAX / GLYCOLAX) 17 g packet, Take 17 g by mouth daily  as needed for moderate constipation., Disp: , Rfl:    predniSONE (DELTASONE) 5 MG tablet, Take 1 tablet by mouth daily., Disp: , Rfl:    Probiotic Product (PROBIOTIC PO), Take 1 capsule by mouth daily., Disp: , Rfl:    rOPINIRole (REQUIP) 0.5 MG tablet, TAKE 1 TABLET(0.5 MG) BY MOUTH AT BEDTIME, Disp: 90 tablet, Rfl: 2   simvastatin (ZOCOR) 20 MG tablet, Take 1 tablet (20 mg total) by mouth daily in the afternoon., Disp: 30 tablet, Rfl: 3   triamcinolone cream (KENALOG) 0.1 %, Apply 1 Application topically 2 (two) times daily., Disp: 30 g, Rfl: 0   potassium chloride SA (KLOR-CON M) 20 MEQ tablet, Take 2 tablets (40 mEq total) by mouth daily for 4 days. (Patient not taking: Reported on 09/01/2022), Disp: 8 tablet, Rfl: 0  Observations/Objective: There were no vitals filed for this visit. Physical Exam No labored breathing.  Speech is clear and coherent with logical content.  Patient is alert and oriented at baseline.   Assessment and Plan: Stress -     Amb ref to Integrated Behavioral Health    Follow Up Instructions: Return in about 1 month (around 01/04/2023) for anxiety and depression.   I discussed the assessment and treatment plan with the patient. The patient was provided an opportunity to ask questions, and all were answered. The patient agreed with the plan and demonstrated an understanding of the instructions.   The patient  was advised to call back or seek an in-person evaluation if the symptoms worsen or if the condition fails to improve as anticipated.  The above assessment and management plan was discussed with the patient. The patient verbalized understanding of and has agreed to the management plan.   Alvira Monday, FNP

## 2022-12-06 ENCOUNTER — Encounter (HOSPITAL_COMMUNITY): Payer: Self-pay | Admitting: Hematology

## 2022-12-07 ENCOUNTER — Ambulatory Visit: Payer: Medicaid Other | Admitting: Licensed Clinical Social Worker

## 2022-12-07 DIAGNOSIS — F4321 Adjustment disorder with depressed mood: Secondary | ICD-10-CM

## 2022-12-11 NOTE — BH Specialist Note (Signed)
Integrated Behavioral Health via Telemedicine Visit  12/11/2022 Leah Padilla [id number]  Number of [place] Clinician visits: 1 Session Start time:  1:03pm Session End time: 1:42pm Total time in minutes: 39 mins via mychart video   Referring Provider: Verne Grain NP  Patient/Family location: Home  [facility] Provider location: [place]  All persons participating in visit: Pt Leah Padilla and LCSW A Renji Berwick  Types of Service: Individual psychotherapy and Video visit  I connected with Leah Padilla and/or Leah Padilla n/a via  Telephone or Video Enabled Telemedicine Application  (Video is Caregility application) and verified that I am speaking with the correct person using two identifiers. Discussed confidentiality: Yes   I discussed the limitations of telemedicine and the availability of in person appointments.  Discussed there is a possibility of technology failure and discussed alternative modes of communication if that failure occurs.  I discussed that engaging in this telemedicine visit, they consent to the provision of behavioral healthcare and the services will be billed under their insurance.  Patient and/or legal guardian expressed understanding and consented to Telemedicine visit: Yes   Presenting Concerns: Patient and/or family reports the following symptoms/concerns: depressed mood, sadness and stress  Duration of problem: approx one year; Severity of problem: mild  Patient and/or Family's Strengths/Protective Factors: Concrete supports in place (healthy food, safe environments, etc.)  Goals Addressed: Patient will:  Reduce symptoms of: depression and stress   Increase knowledge and/or ability of: coping skills and stress reduction   Demonstrate ability to: Increase healthy adjustment to current life circumstances  Progress towards Goals: Ongoing  Interventions: Interventions utilized:  Motivational Interviewing and Supportive  Counseling Standardized Assessments completed: Not Needed  Patient and/or Family Response: Ms. Leah Padilla reports feeling overwhelmed, and depressed mood. Ms. Leah Padilla reports family stressors are triggers.   Assessment: Patient currently experiencing adjustment disorder with depressed mood .   Patient may benefit from integrated behavioral health.  Plan: Follow up with behavioral health clinician on : as needed  Behavioral recommendations: prioritize rest and self care, create and prioritize task list, communicate need with supportive person for added support  Referral(s): [place] (In Clinic)  I discussed the assessment and treatment plan with the patient and/or parent/guardian. They were provided an opportunity to ask questions and all were answered. They agreed with the plan and demonstrated an understanding of the instructions.   They were advised to call back or seek an in-person evaluation if the symptoms worsen or if the condition fails to improve as anticipated.  Lynnea Ferrier, LCSW

## 2022-12-20 ENCOUNTER — Other Ambulatory Visit: Payer: Self-pay | Admitting: Family Medicine

## 2022-12-20 ENCOUNTER — Other Ambulatory Visit: Payer: Self-pay

## 2022-12-20 ENCOUNTER — Telehealth: Payer: Self-pay | Admitting: Family Medicine

## 2022-12-20 MED ORDER — AMLODIPINE BESYLATE 10 MG PO TABS: ORAL_TABLET | ORAL | 0 refills | Status: DC

## 2022-12-20 NOTE — Telephone Encounter (Signed)
Prescription Request  12/20/2022  LOV: 10/24/2022  What is the name of the medication or equipment? amLODipine (NORVASC) 10 MG tablet [id number]   Have you contacted your pharmacy to request a refill? No   Which pharmacy would you like this sent to?  [facility] [phone] - [place], [place] AT [address] [id number] [place] [address] Phone: [phone] Fax: [phone]    Patient notified that their request is being sent to the clinical staff for review and that they should receive a response within 2 business days.   Please advise at Mobile [phone] (mobile)

## 2022-12-20 NOTE — Telephone Encounter (Signed)
Refills sent to pharmacy. 

## 2022-12-21 ENCOUNTER — Telehealth: Payer: Self-pay | Admitting: *Deleted

## 2022-12-21 NOTE — Telephone Encounter (Signed)
Received faxed confirmation

## 2022-12-21 NOTE — Telephone Encounter (Signed)
Received fax from [place] ~ [phone]~ telephone/ [phone]~ fax.    Electronically signed HIPPA Compliant Authorization for Release of Medical Information enclosed with date of 11/16/2022.   Medical records requested: 07/11/2021- present   Medical Record Request ID: Case Number [id number]   Records from [place] faxed to [phone].

## 2022-12-25 DIAGNOSIS — D869 Sarcoidosis, unspecified: Secondary | ICD-10-CM | POA: Diagnosis not present

## 2022-12-28 ENCOUNTER — Ambulatory Visit: Payer: Medicaid Other | Admitting: Internal Medicine

## 2023-01-22 ENCOUNTER — Other Ambulatory Visit: Payer: Self-pay | Admitting: Family Medicine

## 2023-01-28 IMAGING — CT CT HEAD W/O CM
4 of 6 series · 17 of 47 positions shown, 19 images · non-contrast
Comparison: 12/02/2021

CLINICAL DATA: Altered mental status



[Series 2: head w o · axial · 0.43mm/px · z∈[-568,-463]mm · 5 of 33 slices shown, 7 images]
[im 6/33  brain]
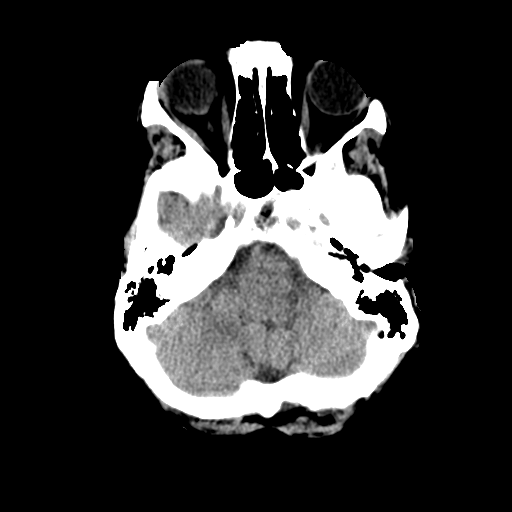
[im 6/33  bone]
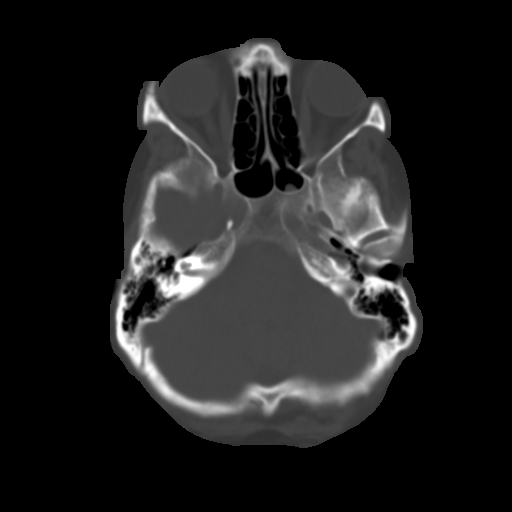
[im 11/33  brain]
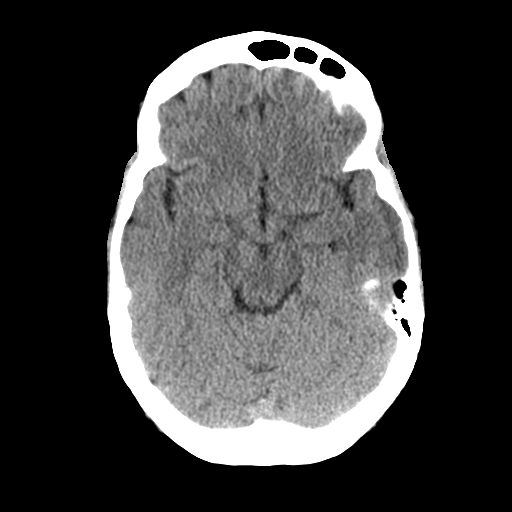
[im 17/33  brain]
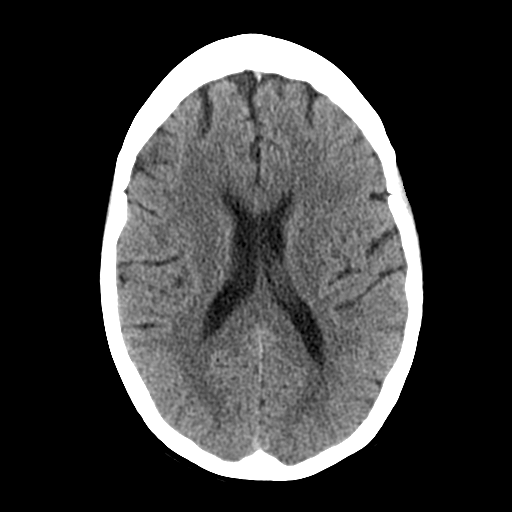
[im 22/33  brain]
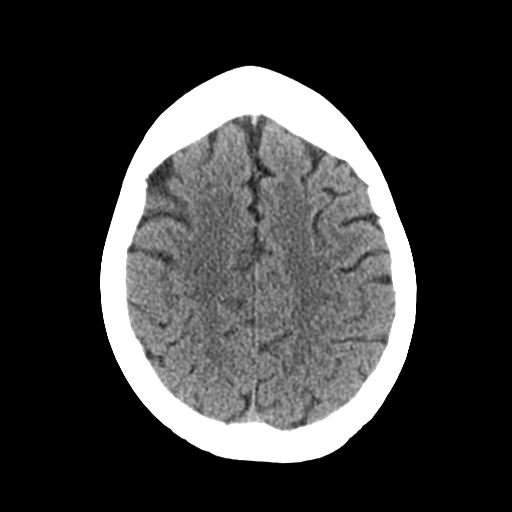
[im 27/33  brain]
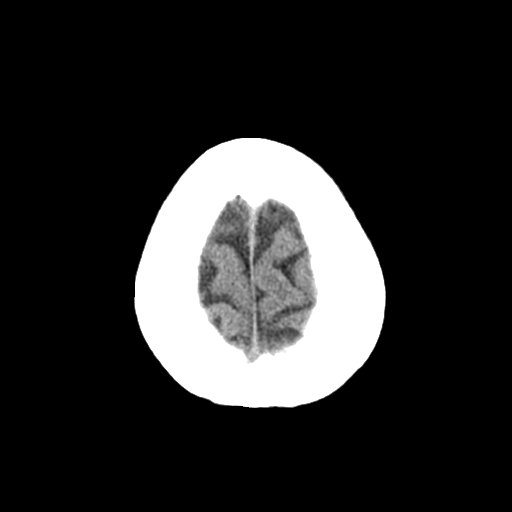
[im 27/33  bone]
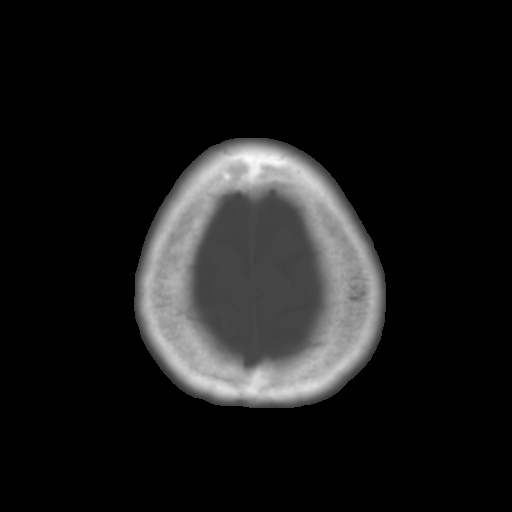

[Series 3: head bone · axial · 0.43mm/px · z∈[-583,-483]mm · 6 of 82 slices shown]
[im 6/82  bone]
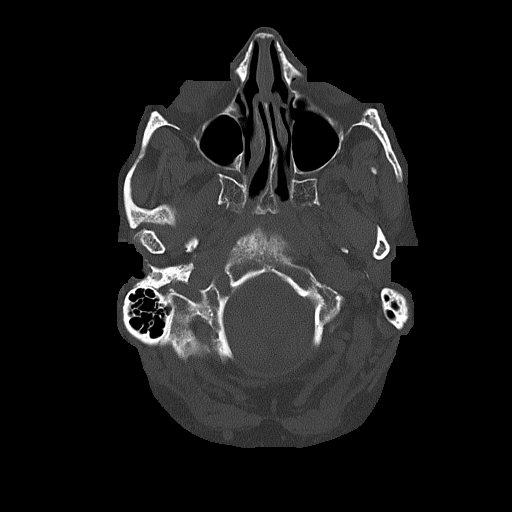
[im 16/82  bone]
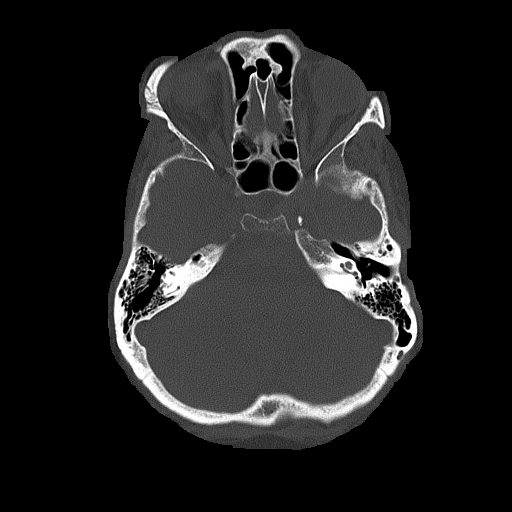
[im 26/82  bone]
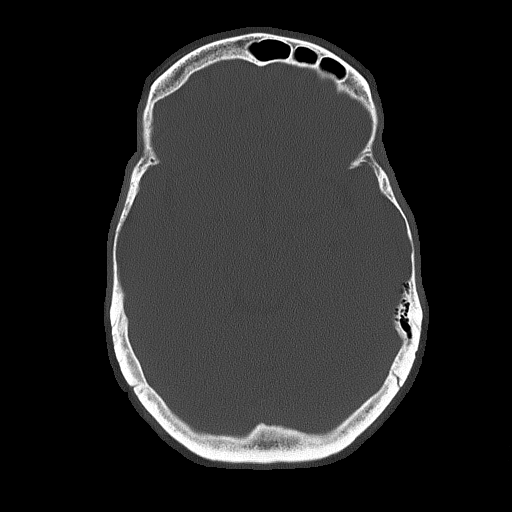
[im 36/82  bone]
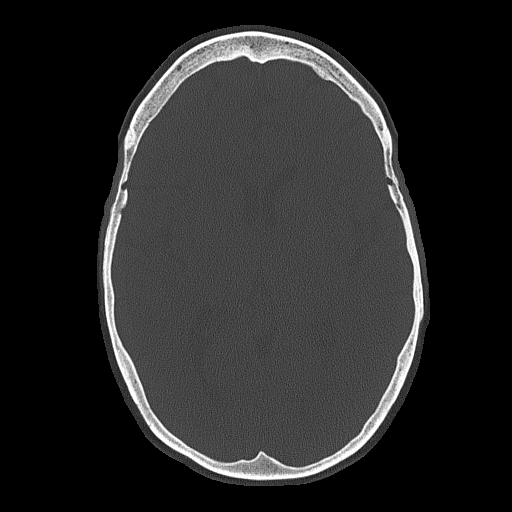
[im 46/82  bone]
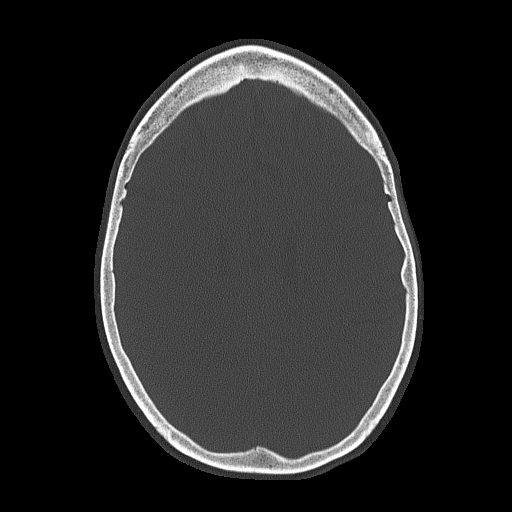
[im 56/82  bone]
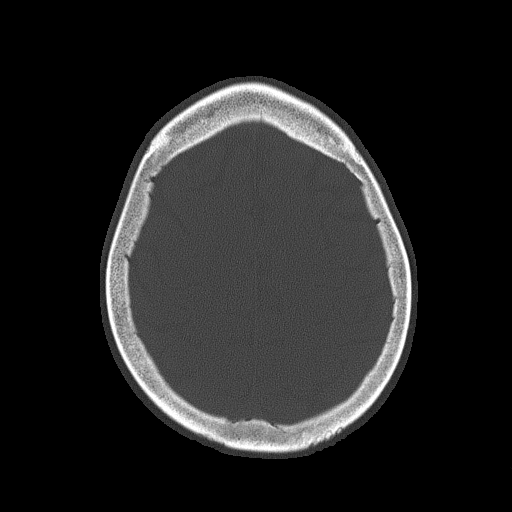

[Series 4: coronal soft · coronal · 0.33mm/px · 3 of 71 slices shown]
[im 24/71  brain]
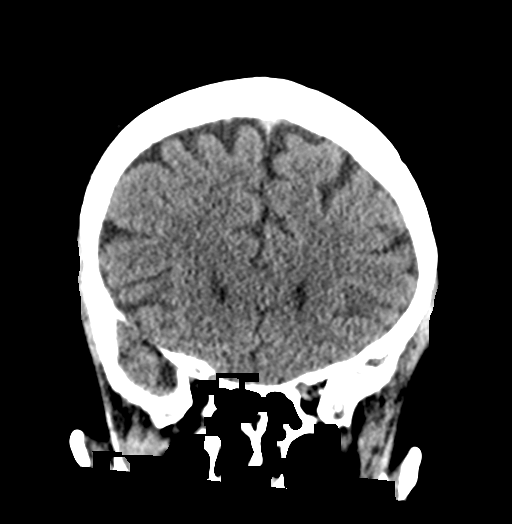
[im 32/71  brain]
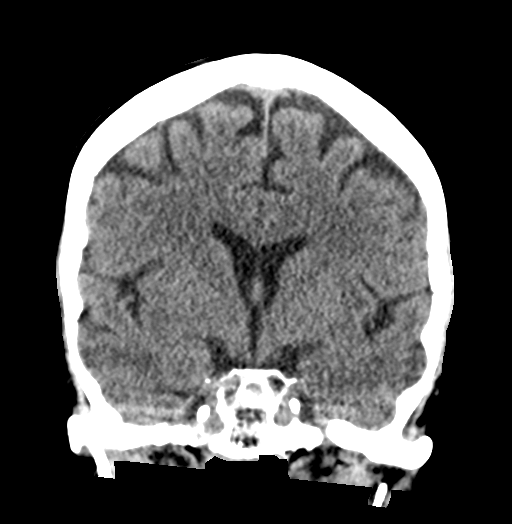
[im 39/71  brain]
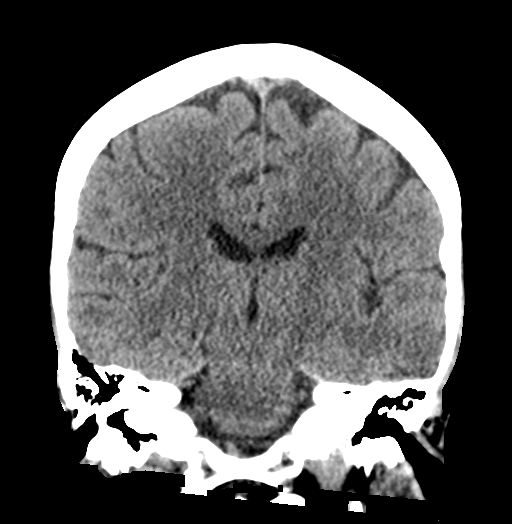

[Series 5: sagittal soft · sagittal · 0.34mm/px · 3 of 57 slices shown]
[im 19/57  brain]
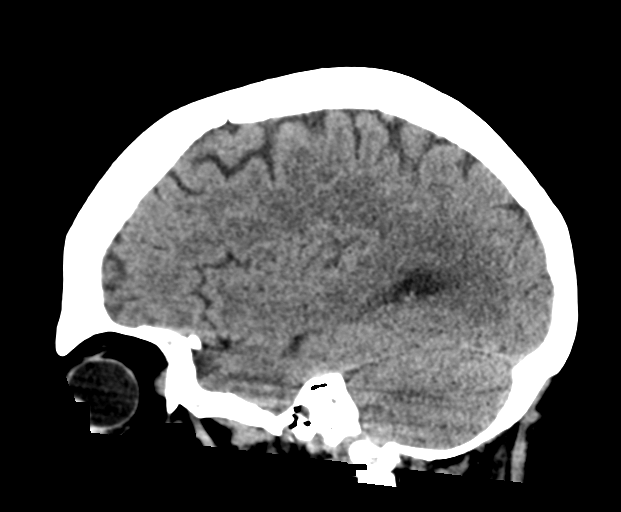
[im 29/57  brain]
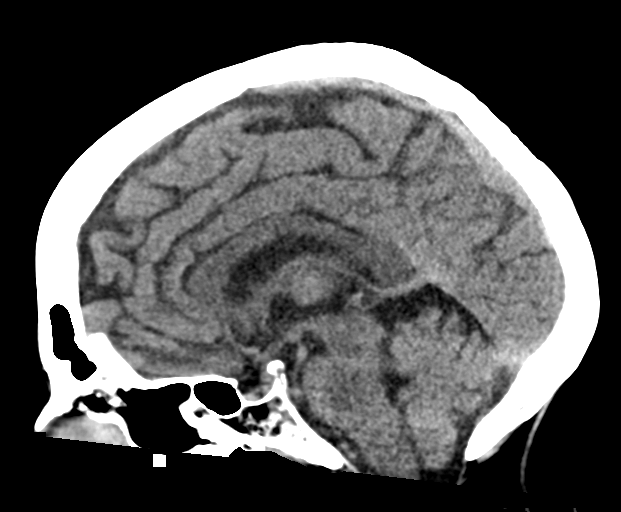
[im 38/57  brain]
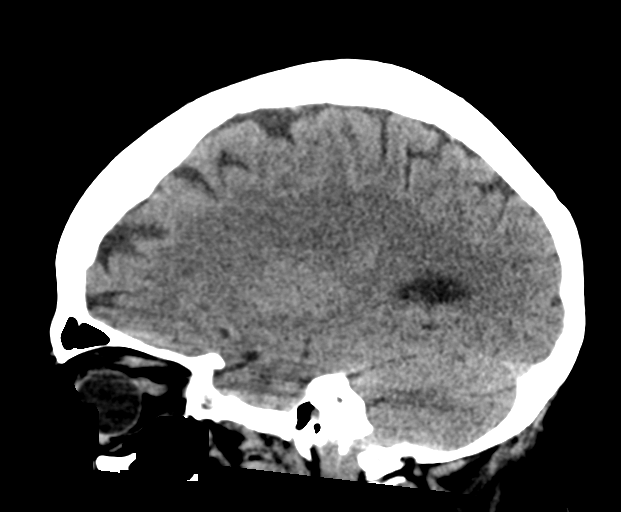

[17 of 47 positions shown; findings below may reference images not displayed]

FINDINGS: Brain: No acute intracranial findings are seen. Cortical sulci are
prominent. Ventricles are not dilated. There is no shift of midline
structures. There are no epidural or subdural fluid collections.

Vascular: Unremarkable.

Skull: No fracture is seen in the calvarium.

Sinuses/Orbits: Mucous retention cyst is seen in the left side of
sphenoid sinus.

Other: None
IMPRESSION: No acute intracranial findings are seen in noncontrast CT brain.

Mild chronic sphenoid sinusitis.

## 2023-01-29 IMAGING — DX DG CHEST 1V PORT
1 series · 1 of 1 positions shown · non-contrast
Comparison: None.

CLINICAL DATA: Chest pain, shortness of breath, hypertension

EXAM:
PORTABLE CHEST 1 VIEW

[chest ap]
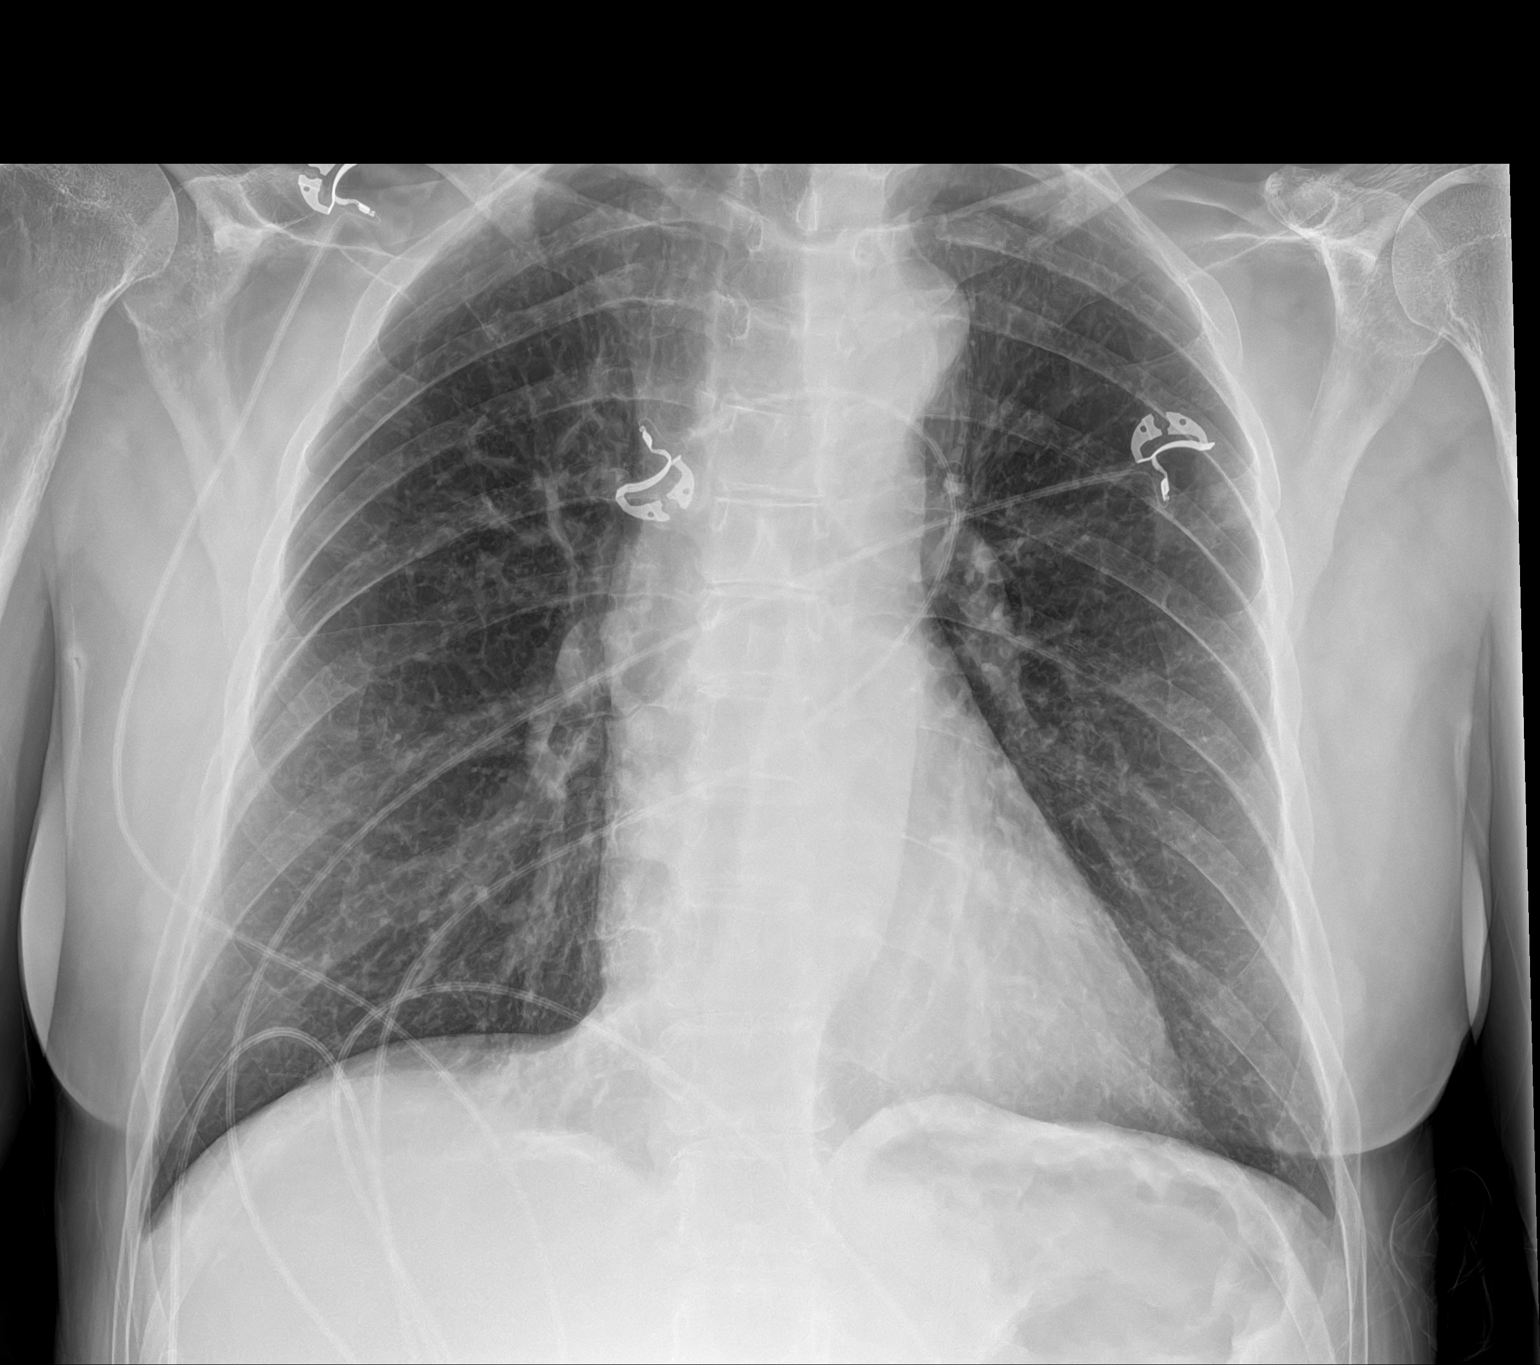

[1 of 1 positions shown; findings below may reference images not displayed]

FINDINGS: Single frontal view of the chest demonstrates an unremarkable
cardiac silhouette. No acute airspace disease, effusion, or
pneumothorax. No acute bony abnormalities.
IMPRESSION: 1. No acute intrathoracic process.

## 2023-02-21 ENCOUNTER — Other Ambulatory Visit: Payer: Self-pay | Admitting: Family Medicine

## 2023-02-22 ENCOUNTER — Ambulatory Visit: Payer: Medicaid Other | Admitting: Family Medicine

## 2023-02-22 ENCOUNTER — Encounter: Payer: Self-pay | Admitting: Family Medicine

## 2023-02-22 VITALS — BP 104/71 | HR 82 | Ht 68.5 in | Wt 135.1 lb

## 2023-02-22 DIAGNOSIS — F419 Anxiety disorder, unspecified: Secondary | ICD-10-CM

## 2023-02-22 DIAGNOSIS — I10 Essential (primary) hypertension: Secondary | ICD-10-CM | POA: Diagnosis not present

## 2023-02-22 DIAGNOSIS — E7849 Other hyperlipidemia: Secondary | ICD-10-CM | POA: Diagnosis not present

## 2023-02-22 DIAGNOSIS — E559 Vitamin D deficiency, unspecified: Secondary | ICD-10-CM | POA: Diagnosis not present

## 2023-02-22 DIAGNOSIS — E0789 Other specified disorders of thyroid: Secondary | ICD-10-CM

## 2023-02-22 DIAGNOSIS — R7301 Impaired fasting glucose: Secondary | ICD-10-CM | POA: Diagnosis not present

## 2023-02-22 NOTE — Assessment & Plan Note (Signed)
She takes simvastatin 20 mg daily No muscle aches or pain reported Pending lipid panel

## 2023-02-22 NOTE — Patient Instructions (Signed)
I appreciate the opportunity to provide care to you today!    Follow up:  3 months  Labs: please stop by the lab during the week to get your blood drawn (CBC, CMP, TSH, Lipid profile, HgA1c, Vit D)    Referrals today- [facility] for talk therapy   Please continue to a heart-healthy diet and increase your physical activities. Try to exercise for at least five days a week.      It was a pleasure to see you and I look forward to continuing to work together on your health and well-being. Please do not hesitate to call the office if you need care or have questions about your care.   Have a wonderful day and week. With Gratitude, [place] Laroche MSN, FNP-BC

## 2023-02-22 NOTE — Progress Notes (Signed)
Established Patient Office Visit  Subjective:  Patient ID: Leah Padilla, female    DOB: 1961/02/17  Age: 62 y.o. MRN: [id number]  CC:  Chief Complaint  Patient presents with   Chronic Care Management    4 month follow up appt.     HPI Leah Padilla is a 62 y.o. female with past medical history of hyperlipidemia and hypertension presents for f/u of  chronic medical conditions. For the details of today's visit, please refer to the assessment and plan.     Past Medical History:  Diagnosis Date   Anxiety    Bell's palsy    Depression    High cholesterol    Hypertension     Past Surgical History:  Procedure Laterality Date   AXILLARY LYMPH NODE BIOPSY Right 01/17/2022   Procedure: AXILLARY LYMPH NODE BIOPSY;  Surgeon: Lewie Chamber, DO;  Location: [facility];  Service: General;  Laterality: Right;   BIOPSY  08/01/2022   Procedure: BIOPSY;  Surgeon: Lanelle Bal, DO;  Location: [facility];  Service: Endoscopy;;   COLONOSCOPY N/A 04/17/2013   Procedure: COLONOSCOPY;  Surgeon: Malissa Hippo, MD;  Location: [facility];  Service: Endoscopy;  Laterality: N/A;  930   COLONOSCOPY WITH PROPOFOL N/A 08/01/2022   Procedure: COLONOSCOPY WITH PROPOFOL;  Surgeon: Lanelle Bal, DO;  Location: [facility];  Service: Endoscopy;  Laterality: N/A;  1:30 pm   CYST EXCISION     neck    Family History  Problem Relation Age of Onset   Multiple myeloma Mother    Stroke Father    Breast cancer Sister    Stroke Other    Diabetes Other    Colon cancer Neg Hx     Social History   Socioeconomic History   Marital status: Divorced    Spouse name: Not on file   Number of children: 2   Years of education: Not on file   Highest education level: Not on file  Occupational History   Not on file  Tobacco Use   Smoking status: Former    Packs/day: 0.50    Years: 13.00    Additional pack years: 0.00    Total pack years: 6.50    Types: Cigarettes    Quit date:  12/13/2016    Years since quitting: 6.1   Smokeless tobacco: Never  Vaping Use   Vaping Use: Former  Substance and Sexual Activity   Alcohol use: Not Currently    Comment: occasional   Drug use: Yes    Frequency: 3.0 times per week    Types: Marijuana    Comment: last used 07/30/22   Sexual activity: Yes    Birth control/protection: Post-menopausal  Other Topics Concern   Not on file  Social History Narrative   Not on file   Social Determinants of Health   Financial Resource Strain: High Risk (08/04/2022)   Overall Financial Resource Strain (CARDIA)    Difficulty of Paying Living Expenses: Hard  Food Insecurity: No Food Insecurity (07/27/2022)   Hunger Vital Sign    Worried About Running Out of Food in the Last Year: Never true    Ran Out of Food in the Last Year: Never true  Transportation Needs: No Transportation Needs (07/27/2022)   PRAPARE - Administrator, Civil Service (Medical): No    Lack of Transportation (Non-Medical): No  Physical Activity: Not on file  Stress: Not on file  Social Connections: Not on file  Intimate Partner Violence: Not At Risk (07/27/2022)   Humiliation, Afraid, Rape, and Kick questionnaire    Fear of Current or Ex-Partner: No    Emotionally Abused: No    Physically Abused: No    Sexually Abused: No    Outpatient Medications Prior to Visit  Medication Sig Dispense Refill   acetaminophen (TYLENOL) 500 MG tablet Take 1,000 mg by mouth every 6 (six) hours as needed for moderate pain.     ALPRAZolam (XANAX) 1 MG tablet Take 1 mg by mouth 4 (four) times daily as needed for anxiety.      amLODipine (NORVASC) 10 MG tablet TAKE 1 TABLET(10 MG) BY MOUTH DAILY 90 tablet 0   fexofenadine (ALLEGRA) 180 MG tablet Take 180 mg by mouth daily as needed for allergies or rhinitis.     imipramine (TOFRANIL) 50 MG tablet Take 50 mg by mouth at bedtime.     metoprolol tartrate (LOPRESSOR) 25 MG tablet Take 1 tablet (25 mg total) by mouth 2 (two)  times daily. 60 tablet 0   Multiple Vitamin (MULTIVITAMIN) tablet Take 1 tablet by mouth daily.     polyethylene glycol (MIRALAX / GLYCOLAX) 17 g packet Take 17 g by mouth daily as needed for moderate constipation.     Probiotic Product (PROBIOTIC PO) Take 1 capsule by mouth daily.     rOPINIRole (REQUIP) 0.5 MG tablet TAKE 1 TABLET(0.5 MG) BY MOUTH AT BEDTIME 90 tablet 2   simvastatin (ZOCOR) 20 MG tablet TAKE 1 TABLET(20 MG) BY MOUTH DAILY IN THE AFTERNOON 30 tablet 3   triamcinolone cream (KENALOG) 0.1 % Apply 1 Application topically 2 (two) times daily. 30 g 0   potassium chloride SA (KLOR-CON M) 20 MEQ tablet Take 2 tablets (40 mEq total) by mouth daily for 4 days. (Patient not taking: Reported on 09/01/2022) 8 tablet 0   No facility-administered medications prior to visit.    Allergies  Allergen Reactions   Doxycycline Nausea Only    Respiratory Distress, nausea, loss of appetite   Penicillins Hives and Nausea Only   Ciprofloxacin Other (See Comments)    Dry mouth, skin tingling, panicky, stomach pain, anxious and nervous    Estrace [Estradiol]     Vaginal discharge    Mirtazapine     Made BP drop and felt faint    Nitrofurantoin Other (See Comments)    Chills and hot flashes    Polymox [Amoxicillin] Hives   Progesterone     Burning / vaginal discharge     ROS Review of Systems  Constitutional:  Negative for chills and fever.  Eyes:  Negative for visual disturbance.  Respiratory:  Negative for chest tightness and shortness of breath.   Neurological:  Negative for dizziness and headaches.  Psychiatric/Behavioral:  Negative for suicidal ideas.        Depressed      Objective:    Physical Exam HENT:     Head: Normocephalic.     Mouth/Throat:     Mouth: Mucous membranes are moist.  Cardiovascular:     Rate and Rhythm: Normal rate.     Heart sounds: Normal heart sounds.  Pulmonary:     Effort: Pulmonary effort is normal.     Breath sounds: Normal breath sounds.   Neurological:     Mental Status: She is alert.     BP 104/71   Pulse 82   Ht 5' 8.5" (1.74 m)   Wt 135 lb 1.9 oz (61.3 kg)   SpO2 98%  BMI 20.25 kg/m  Wt Readings from Last 3 Encounters:  02/22/23 135 lb 1.9 oz (61.3 kg)  11/28/22 135 lb (61.2 kg)  10/24/22 129 lb 1.9 oz (58.6 kg)    Lab Results  Component Value Date   TSH 0.385 01/13/2022   Lab Results  Component Value Date   WBC 8.7 09/06/2022   HGB 12.8 09/06/2022   HCT 38.0 09/06/2022   MCV 86.6 09/06/2022   PLT 380 09/06/2022   Lab Results  Component Value Date   NA 133 (L) 08/23/2022   K 4.3 08/23/2022   CO2 22 08/23/2022   GLUCOSE 109 (H) 08/23/2022   BUN 17 08/23/2022   CREATININE 1.86 (H) 08/23/2022   BILITOT 0.5 08/23/2022   ALKPHOS 103 08/23/2022   AST 34 08/23/2022   ALT 33 08/23/2022   PROT 8.0 08/23/2022   ALBUMIN 3.9 08/23/2022   CALCIUM 9.1 08/23/2022   ANIONGAP 3 (L) 08/23/2022   No results found for: "CHOL" No results found for: "HDL" No results found for: "LDLCALC" No results found for: "TRIG" No results found for: "CHOLHDL" No results found for: "HGBA1C"    Assessment & Plan:  Essential hypertension Assessment & Plan: Controlled  She takes amlodipine 10 mg and metoprolol 25 mg twice daily Asymptomatic in the clinic Encouraged to continue treatment regimen BP Readings from Last 3 Encounters:  02/22/23 104/71  11/28/22 106/66  10/24/22 97/67     Orders: -     CBC with Differential/Platelet -     CMP14+EGFR  Other hyperlipidemia Assessment & Plan: She takes simvastatin 20 mg daily No muscle aches or pain reported Pending lipid panel   Orders: -     Lipid panel  Anxiety Assessment & Plan: GAD-7 is 13 PHQ-9 is 14 Patient was recently placed on disability and reports feeling depressed not being able to work She would like to speak with her therapists to develop some coping skills in dealing with her current state  Denies suicidal thoughts and ideation Referral  placed  Patient and/or legal guardian verbally consented to [facility] services about presenting concerns and psychiatric consultation as appropriate.  The services will be billed as appropriate for the patient.   Orders: -     Amb ref to [facility]  Vitamin D deficiency -     VITAMIN D 25 Hydroxy (Vit-D Deficiency, Fractures)  Impaired fasting blood sugar -     Hemoglobin A1c  Other specified disorders of thyroid -     TSH + free T4    Follow-up: Return in about 3 months (around 05/25/2023).   Gilmore Laroche, FNP

## 2023-02-22 NOTE — Assessment & Plan Note (Signed)
Controlled  She takes amlodipine 10 mg and metoprolol 25 mg twice daily Asymptomatic in the clinic Encouraged to continue treatment regimen BP Readings from Last 3 Encounters:  02/22/23 104/71  11/28/22 106/66  10/24/22 97/67

## 2023-02-22 NOTE — Assessment & Plan Note (Signed)
GAD-7 is 13 PHQ-9 is 14 Patient was recently placed on disability and reports feeling depressed not being able to work She would like to speak with her therapists to develop some coping skills in dealing with her current state  Denies suicidal thoughts and ideation Referral placed  Patient and/or legal guardian verbally consented to [facility] services about presenting concerns and psychiatric consultation as appropriate.  The services will be billed as appropriate for the patient.

## 2023-02-28 DIAGNOSIS — D869 Sarcoidosis, unspecified: Secondary | ICD-10-CM | POA: Diagnosis not present

## 2023-02-28 DIAGNOSIS — F419 Anxiety disorder, unspecified: Secondary | ICD-10-CM | POA: Diagnosis not present

## 2023-02-28 DIAGNOSIS — R300 Dysuria: Secondary | ICD-10-CM | POA: Diagnosis not present

## 2023-03-06 DIAGNOSIS — R911 Solitary pulmonary nodule: Secondary | ICD-10-CM | POA: Diagnosis not present

## 2023-03-08 ENCOUNTER — Telehealth: Payer: Self-pay | Admitting: Family Medicine

## 2023-03-08 ENCOUNTER — Other Ambulatory Visit: Payer: Self-pay | Admitting: Family Medicine

## 2023-03-08 ENCOUNTER — Other Ambulatory Visit: Payer: Self-pay

## 2023-03-08 MED ORDER — METOPROLOL TARTRATE 25 MG PO TABS: 25.0000 mg | ORAL_TABLET | Freq: Two times a day (BID) | ORAL | 0 refills | Status: DC

## 2023-03-08 MED ORDER — SIMVASTATIN 20 MG PO TABS: ORAL_TABLET | ORAL | 3 refills | Status: DC

## 2023-03-08 MED ORDER — AMLODIPINE BESYLATE 10 MG PO TABS: ORAL_TABLET | ORAL | 0 refills | Status: DC

## 2023-03-08 NOTE — Telephone Encounter (Signed)
Refills sent

## 2023-03-08 NOTE — Telephone Encounter (Signed)
Script refilled

## 2023-03-08 NOTE — Telephone Encounter (Signed)
Prescription Request  03/08/2023  LOV: 02/22/2023  What is the name of the medication or equipment? amLODipine (NORVASC) 10 MG tablet   metoprolol tartrate (LOPRESSOR) 25 MG tablet   simvastatin (ZOCOR) 20 MG tablet   Have you contacted your pharmacy to request a refill? Yes   Which pharmacy would you like this sent to?  [facility] [phone] - [place], [place] - [address] AT [facility] [address] [place] [address] Phone: [phone] Fax: [phone]    Patient notified that their request is being sent to the clinical staff for review and that they should receive a response within 2 business days.   Please advise at [facility] [phone]    Patient also wants [facility] referral placed here in office with Arlys John

## 2023-03-20 ENCOUNTER — Telehealth: Payer: Self-pay | Admitting: Family Medicine

## 2023-03-20 NOTE — Telephone Encounter (Signed)
Spoke to pt let her know, Arlys John has been out of the office that may be why she has not got a call, advised she should be receiving a call soon.

## 2023-03-20 NOTE — Telephone Encounter (Signed)
Patient called still waiting on referral for [facility] with Arlys John in our office. Patient call back # [phone]

## 2023-04-09 ENCOUNTER — Other Ambulatory Visit: Payer: Self-pay | Admitting: Family Medicine

## 2023-05-10 ENCOUNTER — Encounter: Payer: Self-pay | Admitting: Internal Medicine

## 2023-05-10 ENCOUNTER — Ambulatory Visit (INDEPENDENT_AMBULATORY_CARE_PROVIDER_SITE_OTHER): Payer: Medicaid Other | Admitting: Internal Medicine

## 2023-05-10 VITALS — BP 114/74 | HR 95 | Ht 68.5 in | Wt 135.4 lb

## 2023-05-10 DIAGNOSIS — R300 Dysuria: Secondary | ICD-10-CM

## 2023-05-10 DIAGNOSIS — N3001 Acute cystitis with hematuria: Secondary | ICD-10-CM | POA: Diagnosis not present

## 2023-05-10 MED ORDER — SULFAMETHOXAZOLE-TRIMETHOPRIM 800-160 MG PO TABS
1.0000 | ORAL_TABLET | Freq: Two times a day (BID) | ORAL | 0 refills | Status: AC
Start: 2023-05-10 — End: 2023-05-13

## 2023-05-10 NOTE — Progress Notes (Signed)
Acute Office Visit  Subjective:     Patient ID: Leah Padilla, female    DOB: [birth year], 62 y.o.   MRN: [id number]  Chief Complaint  Patient presents with   Urinary Tract Infection    Frequency and burning for 2 weeks.   Ms. Care presents today for an acute visit with concern for UTI.  She endorses 2-week history of dysuria, increased urinary frequency, fever/chills, and foul odor of urine.  She states that her current symptoms are similar to what she has previously experienced when she has had a urinary tract infection.  She states that because of a history of sarcoidosis she does not appreciate her symptoms until they are advanced.  Last treated for UTI with Bactrim in May.  Review of Systems  Constitutional:  Positive for chills.  Genitourinary:  Positive for dysuria and frequency.      Objective:    BP 114/74   Pulse 95   Ht 5' 8.5" (1.74 m)   Wt 135 lb 6.4 oz (61.4 kg)   SpO2 98%   BMI 20.29 kg/m   Physical Exam Vitals reviewed.  Constitutional:      General: She is not in acute distress.    Appearance: Normal appearance. She is not toxic-appearing.  HENT:     Head: Normocephalic and atraumatic.     Right Ear: External ear normal.     Left Ear: External ear normal.     Nose: Nose normal. No congestion or rhinorrhea.     Mouth/Throat:     Mouth: Mucous membranes are moist.     Pharynx: Oropharynx is clear. No oropharyngeal exudate or posterior oropharyngeal erythema.  Eyes:     General: No scleral icterus.    Extraocular Movements: Extraocular movements intact.     Conjunctiva/sclera: Conjunctivae normal.     Pupils: Pupils are equal, round, and reactive to light.  Cardiovascular:     Rate and Rhythm: Normal rate and regular rhythm.     Pulses: Normal pulses.     Heart sounds: Normal heart sounds. No murmur heard.    No friction rub. No gallop.  Pulmonary:     Effort: Pulmonary effort is normal.     Breath sounds: Normal breath sounds. No wheezing,  rhonchi or rales.  Abdominal:     General: Abdomen is flat. Bowel sounds are normal. There is no distension.     Palpations: Abdomen is soft.     Tenderness: There is no abdominal tenderness.  Musculoskeletal:        General: No swelling. Normal range of motion.     Cervical back: Normal range of motion.     Right lower leg: No edema.     Left lower leg: No edema.  Lymphadenopathy:     Cervical: No cervical adenopathy.  Skin:    General: Skin is warm and dry.     Capillary Refill: Capillary refill takes less than 2 seconds.     Coloration: Skin is not jaundiced.  Neurological:     General: No focal deficit present.     Mental Status: She is alert and oriented to person, place, and time.  Psychiatric:        Mood and Affect: Mood normal.        Behavior: Behavior normal.       Assessment & Plan:   Problem List Items Addressed This Visit       Acute cystitis with hematuria    Presenting today for an acute visit  endorsing symptoms concerning for UTI.  Symptoms have been present for the last 2 weeks.  UA today is leukocyte esterase positive and notable for trace blood. -Given her current symptoms and UA findings, I have prescribed Bactrim DS x 3 days.  Urine culture pending.  Her most recent urine culture from July 2023 grew Klebsiella resistant to Augmentin and cefuroxime.  Bactrim susceptible. -She was instructed return to care if her symptoms worsen or fail to improve.  Otherwise, she will return for routine follow-up with her PCP later this month.       Meds ordered this encounter  Medications   sulfamethoxazole-trimethoprim (BACTRIM DS) 800-160 MG tablet    Sig: Take 1 tablet by mouth 2 (two) times daily for 3 days.    Dispense:  6 tablet    Refill:  0    Return if symptoms worsen or fail to improve.  Billie Lade, MD

## 2023-05-10 NOTE — Assessment & Plan Note (Signed)
Presenting today for an acute visit endorsing symptoms concerning for UTI.  Symptoms have been present for the last 2 weeks.  UA today is leukocyte esterase positive and notable for trace blood. -Given her current symptoms and UA findings, I have prescribed Bactrim DS x 3 days.  Urine culture pending.  Her most recent urine culture from July 2023 grew Klebsiella resistant to Augmentin and cefuroxime.  Bactrim susceptible. -She was instructed return to care if her symptoms worsen or fail to improve.  Otherwise, she will return for routine follow-up with her PCP later this month.

## 2023-05-10 NOTE — Patient Instructions (Signed)
It was a pleasure to see you today.  Thank you for giving [place] the opportunity to be involved in your care.  Below is a brief recap of your visit and next steps.  We will plan to see you again in August.  Summary Bactrim prescribed for treatment of UTI Follow up with Leah Padilla as scheduled in August

## 2023-05-17 ENCOUNTER — Telehealth: Payer: Self-pay | Admitting: Family Medicine

## 2023-05-17 NOTE — Telephone Encounter (Signed)
Patient said she was told this morning she would need some more antibiotics but her pharmacy has not received any. Please advise [facility] Dr Danae Chen you

## 2023-05-18 ENCOUNTER — Other Ambulatory Visit: Payer: Self-pay | Admitting: Internal Medicine

## 2023-05-18 MED ORDER — NITROFURANTOIN MONOHYD MACRO 100 MG PO CAPS: 100.0000 mg | ORAL_CAPSULE | Freq: Two times a day (BID) | ORAL | 0 refills | Status: AC

## 2023-05-18 NOTE — Telephone Encounter (Signed)
Pt needs an appointment for antibiotics.

## 2023-05-20 ENCOUNTER — Other Ambulatory Visit: Payer: Self-pay | Admitting: Family Medicine

## 2023-05-28 ENCOUNTER — Ambulatory Visit (INDEPENDENT_AMBULATORY_CARE_PROVIDER_SITE_OTHER): Payer: Medicaid Other | Admitting: Family Medicine

## 2023-05-28 ENCOUNTER — Encounter: Payer: Self-pay | Admitting: Family Medicine

## 2023-05-28 VITALS — BP 93/63 | HR 83 | Ht 68.0 in | Wt 134.1 lb

## 2023-05-28 DIAGNOSIS — R7301 Impaired fasting glucose: Secondary | ICD-10-CM

## 2023-05-28 DIAGNOSIS — E7849 Other hyperlipidemia: Secondary | ICD-10-CM

## 2023-05-28 DIAGNOSIS — E559 Vitamin D deficiency, unspecified: Secondary | ICD-10-CM | POA: Diagnosis not present

## 2023-05-28 DIAGNOSIS — I10 Essential (primary) hypertension: Secondary | ICD-10-CM

## 2023-05-28 DIAGNOSIS — F419 Anxiety disorder, unspecified: Secondary | ICD-10-CM | POA: Diagnosis not present

## 2023-05-28 DIAGNOSIS — E038 Other specified hypothyroidism: Secondary | ICD-10-CM

## 2023-05-28 NOTE — Assessment & Plan Note (Signed)
The patient's condition is currently controlled and asymptomatic. She is taking Amlodipine 10 mg daily and Metoprolol 25 mg twice daily. Although her blood pressure was slightly low during the clinic visit, she denies experiencing low readings in her ambulatory measurements. It is recommended that she monitor her blood pressure daily before taking her medication and withhold the medication if her reading is below 90/60 mmHg. Additionally, she should ensure adequate fluid intake of at least 64 ounces per day.

## 2023-05-28 NOTE — Progress Notes (Signed)
Established Patient Office Visit  Subjective:  Patient ID: Leah Padilla, female    DOB: Feb 13, 1961  Age: 62 y.o. MRN: [id number]  CC:  Chief Complaint  Patient presents with   Follow-up    Follow up requesting to see brian for therapy     HPI [name] is a 62 y.o. female with past medical history of hyperlipidemia, hypertension and sarcoidosis presents for f/u of  chronic medical conditions. For the details of today's visit, please refer to the assessment and plan.     Past Medical History:  Diagnosis Date   Anxiety    Bell's palsy    Depression    High cholesterol    Hypertension     Past Surgical History:  Procedure Laterality Date   AXILLARY LYMPH NODE BIOPSY Right 01/17/2022   Procedure: AXILLARY LYMPH NODE BIOPSY;  Surgeon: Lewie Chamber, DO;  Location: [facility];  Service: General;  Laterality: Right;   BIOPSY  08/01/2022   Procedure: BIOPSY;  Surgeon: Lanelle Bal, DO;  Location: [facility];  Service: Endoscopy;;   COLONOSCOPY N/A 04/17/2013   Procedure: COLONOSCOPY;  Surgeon: Malissa Hippo, MD;  Location: [facility];  Service: Endoscopy;  Laterality: N/A;  930   COLONOSCOPY WITH PROPOFOL N/A 08/01/2022   Procedure: COLONOSCOPY WITH PROPOFOL;  Surgeon: Lanelle Bal, DO;  Location: [facility];  Service: Endoscopy;  Laterality: N/A;  1:30 pm   CYST EXCISION     neck    Family History  Problem Relation Age of Onset   Multiple myeloma Mother    Stroke Father    Breast cancer Sister    Stroke Other    Diabetes Other    Colon cancer Neg Hx     Social History   Socioeconomic History   Marital status: Divorced    Spouse name: Not on file   Number of children: 2   Years of education: Not on file   Highest education level: Not on file  Occupational History   Not on file  Tobacco Use   Smoking status: Former    Current packs/day: 0.00    Average packs/day: 0.5 packs/day for 13.0 years (6.5 ttl pk-yrs)    Types: Cigarettes     Start date: 12/14/2003    Quit date: 12/13/2016    Years since quitting: 6.4   Smokeless tobacco: Never  Vaping Use   Vaping status: Former  Substance and Sexual Activity   Alcohol use: Not Currently    Comment: occasional   Drug use: Yes    Frequency: 3.0 times per week    Types: Marijuana    Comment: last used 07/30/22   Sexual activity: Yes    Birth control/protection: Post-menopausal  Other Topics Concern   Not on file  Social History Narrative   Not on file   Social Determinants of Health   Financial Resource Strain: High Risk (08/04/2022)   Overall Financial Resource Strain (CARDIA)    Difficulty of Paying Living Expenses: Hard  Food Insecurity: No Food Insecurity (07/27/2022)   Hunger Vital Sign    Worried About Running Out of Food in the Last Year: Never true    Ran Out of Food in the Last Year: Never true  Transportation Needs: No Transportation Needs (07/27/2022)   PRAPARE - Administrator, Civil Service (Medical): No    Lack of Transportation (Non-Medical): No  Physical Activity: Not on file  Stress: Not on file  Social Connections: Not  on file  Intimate Partner Violence: Not At Risk (07/27/2022)   Humiliation, Afraid, Rape, and Kick questionnaire    Fear of Current or Ex-Partner: No    Emotionally Abused: No    Physically Abused: No    Sexually Abused: No    Outpatient Medications Prior to Visit  Medication Sig Dispense Refill   acetaminophen (TYLENOL) 500 MG tablet Take 1,000 mg by mouth every 6 (six) hours as needed for moderate pain.     ALPRAZolam (XANAX) 1 MG tablet Take 1 mg by mouth 4 (four) times daily as needed for anxiety.      amLODipine (NORVASC) 10 MG tablet TAKE 1 TABLET(10 MG) BY MOUTH DAILY 90 tablet 0   fexofenadine (ALLEGRA) 180 MG tablet Take 180 mg by mouth daily as needed for allergies or rhinitis.     imipramine (TOFRANIL) 50 MG tablet Take 50 mg by mouth at bedtime.     metoprolol tartrate (LOPRESSOR) 25 MG tablet TAKE  1 TABLET(25 MG) BY MOUTH TWICE DAILY 180 tablet 0   Multiple Vitamin (MULTIVITAMIN) tablet Take 1 tablet by mouth daily.     polyethylene glycol (MIRALAX / GLYCOLAX) 17 g packet Take 17 g by mouth daily as needed for moderate constipation.     Probiotic Product (PROBIOTIC PO) Take 1 capsule by mouth daily.     rOPINIRole (REQUIP) 0.5 MG tablet TAKE 1 TABLET(0.5 MG) BY MOUTH AT BEDTIME 90 tablet 2   simvastatin (ZOCOR) 20 MG tablet TAKE 1 TABLET(20 MG) BY MOUTH DAILY IN THE AFTERNOON 90 tablet 1   triamcinolone cream (KENALOG) 0.1 % Apply 1 Application topically 2 (two) times daily. 30 g 0   potassium chloride SA (KLOR-CON M) 20 MEQ tablet Take 2 tablets (40 mEq total) by mouth daily for 4 days. 8 tablet 0   No facility-administered medications prior to visit.    Allergies  Allergen Reactions   Doxycycline Nausea Only    Respiratory Distress, nausea, loss of appetite   Penicillins Hives and Nausea Only   Ciprofloxacin Other (See Comments)    Dry mouth, skin tingling, panicky, stomach pain, anxious and nervous    Estrace [Estradiol]     Vaginal discharge    Mirtazapine     Made BP drop and felt faint    Nitrofurantoin Other (See Comments)    Chills and hot flashes    Polymox [Amoxicillin] Hives   Progesterone     Burning / vaginal discharge     ROS Review of Systems  Constitutional:  Positive for fatigue. Negative for chills and fever.  Eyes:  Negative for visual disturbance.  Respiratory:  Negative for chest tightness and shortness of breath.   Neurological:  Negative for dizziness and headaches.      Objective:    Physical Exam HENT:     Head: Normocephalic.     Mouth/Throat:     Mouth: Mucous membranes are moist.  Cardiovascular:     Rate and Rhythm: Normal rate.     Heart sounds: Normal heart sounds.  Pulmonary:     Effort: Pulmonary effort is normal.     Breath sounds: Normal breath sounds.  Neurological:     Mental Status: She is alert.     BP 93/63 (BP  Location: Right Arm, Patient Position: Sitting, Cuff Size: Normal)   Pulse 83   Ht 5\' 8"  (1.727 m)   Wt 134 lb 1.9 oz (60.8 kg)   SpO2 97%   BMI 20.39 kg/m  Wt Readings from  Last 3 Encounters:  05/28/23 134 lb 1.9 oz (60.8 kg)  05/10/23 135 lb 6.4 oz (61.4 kg)  02/22/23 135 lb 1.9 oz (61.3 kg)    Lab Results  Component Value Date   TSH 0.385 01/13/2022   Lab Results  Component Value Date   WBC 8.7 09/06/2022   HGB 12.8 09/06/2022   HCT 38.0 09/06/2022   MCV 86.6 09/06/2022   PLT 380 09/06/2022   Lab Results  Component Value Date   NA 133 (L) 08/23/2022   K 4.3 08/23/2022   CO2 22 08/23/2022   GLUCOSE 109 (H) 08/23/2022   BUN 17 08/23/2022   CREATININE 1.86 (H) 08/23/2022   BILITOT 0.5 08/23/2022   ALKPHOS 103 08/23/2022   AST 34 08/23/2022   ALT 33 08/23/2022   PROT 8.0 08/23/2022   ALBUMIN 3.9 08/23/2022   CALCIUM 9.1 08/23/2022   ANIONGAP 3 (L) 08/23/2022   No results found for: "CHOL" No results found for: "HDL" No results found for: "LDLCALC" No results found for: "TRIG" No results found for: "CHOLHDL" No results found for: "HGBA1C"    Assessment & Plan:  Anxiety Assessment & Plan: The patient's GAD-7 score is 14, and her PHQ-9 score is also 14. She reports increased financial worries since being on disability for sarcoidosis and experiences significant social isolation, remaining at home most days and avoiding leaving her home. She describes a lack of social support, feeling compelled to live alone, and mentions that her family members are also financially struggling. The patient expresses distress over not working, having worked throughout her life, and is uncertain about the duration of her Social Security benefits. She denies having suicidal thoughts or ideation but reports increased fatigue.  Non-pharmacological interventions were reviewed, including mindfulness, meditation, maintaining a well-balanced diet, and ensuring adequate sleep. The patient has  requested to speak with Arlys John for talk therapy. She is currently under the care of a psychiatrist who manages her medications.  Patient and/or legal guardian verbally consented to [facility] services about presenting concerns and psychiatric consultation as appropriate.  The services will be billed as appropriate for the patient   Orders: -     Amb ref to [facility]  Essential hypertension Assessment & Plan: The patient's condition is currently controlled and asymptomatic. She is taking Amlodipine 10 mg daily and Metoprolol 25 mg twice daily. Although her blood pressure was slightly low during the clinic visit, she denies experiencing low readings in her ambulatory measurements. It is recommended that she monitor her blood pressure daily before taking her medication and withhold the medication if her reading is below 90/60 mmHg. Additionally, she should ensure adequate fluid intake of at least 64 ounces per day.    Other hyperlipidemia Assessment & Plan: The patient is currently taking Simvastatin 20 mg daily and reports no muscle aches or pain. She is encouraged to reduce her intake of saturated fats, trans fats, and cholesterol, while increasing her consumption of fruits, vegetables, whole grains, and omega-3 fatty acids, along with engaging in increased physical activity.    Orders: -     Lipid panel -     CMP14+EGFR -     CBC with Differential/Platelet  IFG (impaired fasting glucose) -     Hemoglobin A1c  Vitamin D deficiency -     VITAMIN D 25 Hydroxy (Vit-D Deficiency, Fractures)  Other specified hypothyroidism -     TSH + free T4  Note: This chart has been completed using Dragon  Medical Dictation software, and while attempts have been made to ensure accuracy, certain words and phrases may not be transcribed as intended.    Follow-up: Return in about 4 months (around 09/27/2023).   Gilmore Laroche, FNP

## 2023-05-28 NOTE — Patient Instructions (Addendum)
I appreciate the opportunity to provide care to you today!    Follow up:  4 months  Labs: please stop by the lab today to get your blood drawn (CBC, CMP, TSH, Lipid profile, HgA1c, Vit D)  Your blood pressure was slightly low during today's clinic visit. I recommend monitoring your blood pressure daily before taking your blood pressure medication and withholding the medication if it is below 90/60 mmHg. Additionally, please ensure adequate fluid intake of at least 64 ounces daily.   Nonpharmacologic management of anxiety and depression  Mindfulness and Meditation Practices like mindfulness meditation can help reduce symptoms by promoting relaxation and present-moment awareness.  Exercise  Regular physical activity has been shown to improve mood and reduce anxiety through the release of endorphins and other neurochemicals.  Healthy Diet Eating a balanced diet rich in fruits, vegetables, whole grains, and lean proteins can support overall mental health.  Sleep Hygiene  Establishing a regular sleep routine and ensuring good sleep quality can significantly impact mood and anxiety levels.  Stress Management Techniques Activities such as yoga, tai chi, and deep breathing exercises can help manage stress.  Social Support Maintaining strong relationships and seeking support from friends, family, or support groups can provide emotional comfort and reduce feelings of isolation.  Lifestyle Modifications Reducing alcohol and caffeine intake, quitting smoking, and avoiding recreational drugs can improve symptoms.  Art and Music Therapy Engaging in creative activities like painting, drawing, or playing music can be therapeutic and help express emotions.  Light Therapy Particularly useful for seasonal affective disorder (SAD), exposure to bright light can help regulate mood. Marland Kitchen    Referrals today- Integrated behavioral [facility])  [facility] for talk therapy  Please continue to a heart-healthy diet and  increase your physical activities. Try to exercise for at least five days a week.    It was a pleasure to see you and I look forward to continuing to work together on your health and well-being. Please do not hesitate to call the office if you need care or have questions about your care.  In case of emergency, please visit the Emergency Department for urgent care, or contact our clinic at [phone] to schedule an appointment. We're here to help you!   Have a wonderful day and week. With Gratitude, Gilmore Laroche MSN, FNP-BC

## 2023-05-28 NOTE — Assessment & Plan Note (Signed)
The patient is currently taking Simvastatin 20 mg daily and reports no muscle aches or pain. She is encouraged to reduce her intake of saturated fats, trans fats, and cholesterol, while increasing her consumption of fruits, vegetables, whole grains, and omega-3 fatty acids, along with engaging in increased physical activity.

## 2023-05-28 NOTE — Assessment & Plan Note (Signed)
The patient's GAD-7 score is 14, and her PHQ-9 score is also 14. She reports increased financial worries since being on disability for sarcoidosis and experiences significant social isolation, remaining at home most days and avoiding leaving her home. She describes a lack of social support, feeling compelled to live alone, and mentions that her family members are also financially struggling. The patient expresses distress over not working, having worked throughout her life, and is uncertain about the duration of her Social Security benefits. She denies having suicidal thoughts or ideation but reports increased fatigue.  Non-pharmacological interventions were reviewed, including mindfulness, meditation, maintaining a well-balanced diet, and ensuring adequate sleep. The patient has requested to speak with Arlys John for talk therapy. She is currently under the care of a psychiatrist who manages her medications.  Patient and/or legal guardian verbally consented to [facility] services about presenting concerns and psychiatric consultation as appropriate.  The services will be billed as appropriate for the patient

## 2023-05-29 LAB — CBC WITH DIFFERENTIAL/PLATELET
Basophils Absolute: 0.1 10*3/uL (ref 0.0–0.2)
Basos: 1 %
EOS (ABSOLUTE): 0.2 10*3/uL (ref 0.0–0.4)
Eos: 2 %
Hematocrit: 46.3 % (ref 34.0–46.6)
Hemoglobin: 14.5 g/dL (ref 11.1–15.9)
Immature Grans (Abs): 0 10*3/uL (ref 0.0–0.1)
Immature Granulocytes: 0 %
Lymphocytes Absolute: 1.9 10*3/uL (ref 0.7–3.1)
Lymphs: 17 %
MCH: 28 pg (ref 26.6–33.0)
MCHC: 31.3 g/dL — ABNORMAL LOW (ref 31.5–35.7)
MCV: 89 fL (ref 79–97)
Monocytes Absolute: 0.8 10*3/uL (ref 0.1–0.9)
Monocytes: 8 %
Neutrophils Absolute: 8.1 10*3/uL — ABNORMAL HIGH (ref 1.4–7.0)
Neutrophils: 72 %
Platelets: 408 10*3/uL (ref 150–450)
RBC: 5.18 x10E6/uL (ref 3.77–5.28)
RDW: 12 % (ref 11.7–15.4)
WBC: 11.1 10*3/uL — ABNORMAL HIGH (ref 3.4–10.8)

## 2023-05-29 LAB — LIPID PANEL
Chol/HDL Ratio: 3.3 ratio (ref 0.0–4.4)
Cholesterol, Total: 147 mg/dL (ref 100–199)
HDL: 45 mg/dL (ref >39)
LDL Chol Calc (NIH): 84 mg/dL (ref 0–99)
Triglycerides: 98 mg/dL (ref 0–149)
VLDL Cholesterol Cal: 18 mg/dL (ref 5–40)

## 2023-05-29 LAB — CMP14+EGFR
ALT: 18 IU/L (ref 0–32)
AST: 26 IU/L (ref 0–40)
Albumin: 4.3 g/dL (ref 3.9–4.9)
Alkaline Phosphatase: 118 IU/L (ref 44–121)
BUN/Creatinine Ratio: 9 — ABNORMAL LOW (ref 12–28)
BUN: 11 mg/dL (ref 8–27)
Bilirubin Total: 0.3 mg/dL (ref 0.0–1.2)
CO2: 24 mmol/L (ref 20–29)
Calcium: 9.5 mg/dL (ref 8.7–10.3)
Chloride: 102 mmol/L (ref 96–106)
Creatinine, Ser: 1.19 mg/dL — ABNORMAL HIGH (ref 0.57–1.00)
Globulin, Total: 3.2 g/dL (ref 1.5–4.5)
Glucose: 95 mg/dL (ref 70–99)
Potassium: 4.5 mmol/L (ref 3.5–5.2)
Sodium: 138 mmol/L (ref 134–144)
Total Protein: 7.5 g/dL (ref 6.0–8.5)
eGFR: 52 mL/min/{1.73_m2} — ABNORMAL LOW (ref >59)

## 2023-05-29 LAB — VITAMIN D 25 HYDROXY (VIT D DEFICIENCY, FRACTURES): Vit D, 25-Hydroxy: 8.4 ng/mL — ABNORMAL LOW (ref 30.0–100.0)

## 2023-05-29 LAB — HEMOGLOBIN A1C
Est. average glucose Bld gHb Est-mCnc: 103 mg/dL
Hgb A1c MFr Bld: 5.2 % (ref 4.8–5.6)

## 2023-05-29 LAB — TSH+FREE T4
Free T4: 1.41 ng/dL (ref 0.82–1.77)
TSH: 1.16 u[IU]/mL (ref 0.450–4.500)

## 2023-05-30 ENCOUNTER — Other Ambulatory Visit: Payer: Self-pay | Admitting: Family Medicine

## 2023-05-30 DIAGNOSIS — E559 Vitamin D deficiency, unspecified: Secondary | ICD-10-CM

## 2023-05-30 MED ORDER — VITAMIN D (ERGOCALCIFEROL) 1.25 MG (50000 UNIT) PO CAPS
50000.0000 [IU] | ORAL_CAPSULE | ORAL | 1 refills | Status: DC
Start: 2023-05-30 — End: 2024-06-20

## 2023-06-07 ENCOUNTER — Ambulatory Visit: Payer: Medicaid Other | Admitting: Professional Counselor

## 2023-06-07 ENCOUNTER — Telehealth: Payer: Self-pay | Admitting: Family Medicine

## 2023-06-07 DIAGNOSIS — F419 Anxiety disorder, unspecified: Secondary | ICD-10-CM

## 2023-06-07 NOTE — Telephone Encounter (Signed)
Pt following up on note below

## 2023-06-07 NOTE — Patient Instructions (Signed)
If your symptoms worsen or you have thoughts of suicide/homicide, PLEASE SEEK IMMEDIATE MEDICAL ATTENTION.  You may always call:   National Suicide Hotline: 988 or 800-273-8255 [place] Crisis Line: 336-832-9700 Crisis Recovery in [place]: 800-939-5911      These are available 24 hours a day, 7 days a week.  

## 2023-06-07 NOTE — Telephone Encounter (Signed)
Patient called in regard to UTI , patient had visit on 8/8 in regard to UTI. Has not cleared up.  Patient has to come in office this afternoon for [facility] wants to know if she can leave sample when she comes in to re check for UTI.   Wants a call back in regard

## 2023-06-07 NOTE — BH Specialist Note (Signed)
Collaborative Care Initial Assessment  Session Start time: 1:00 pm   Session End time: 2:00 pm  Total time in minutes: 60 min   Type of Contact:  Face to Face Patient consent obtained:  Yes or No Types of Service: Collaborative care  Summary  Patient is a 62 yo female being referred to collaborative care by her pcp for anxiety and depression. Patient was engaged and cooperative during session.   Reason for referral in patient/family's own words:  "Depression and panic  Patient's goal for today's visit: "I want to get out more and stop worrying so much"  History of Present illness:   The patient is a 62 year old female with a history of depression, anxiety, and panic disorder. She presents with a chief complaint of financial fear, isolation, difficulty with day-to-day functioning, loss of interest in pleasurable activities, panic attacks, and excessive worrying. About a year ago, she was misdiagnosed with Bell's Palsy and lost 50 pounds while experiencing frequent illness, which led to extended leave from work. After extensive medical evaluations, she was eventually diagnosed with Sarcoidosis and is now working with a specialist to find appropriate treatment. She has been out of work for over a year and was recently laid off, leading to significant fear around her financial situation. Although she has filed for and received disability benefits, she now worries about potentially losing them and is anxious about taxes.  The patient has tried multiple medications, including Wellbutrin and Zoloft, but reports difficulty tolerating them for more than a day or two. She has two children for support and currently lives with her son, though he also struggles with social anxiety and mental health issues, limiting his ability to fully support her. She describes having low self-esteem, which worsened after her traumatic divorce in 2003 from a narcissistic partner. Although she experienced a period of  success when she felt confident and was working, the deaths of her father in 42 and her mother in 2001 contributed significantly to her depression.  The patient often experiences passive suicidal thoughts, feeling that life would be easier if she did not have to live, but denies any plan or intent to harm herself. A behavioral health counselor assessed her for suicidality and determined she is at moderate risk due to her ongoing suicidal thoughts but is not an immediate danger. The patient identifies her children as protective factors, stating they are the main reason she would never hurt herself. She also mentioned that she could call her son for support if needed. Emergency resources and a safety plan were discussed with her, and she accepted them.  She is currently being treated for panic disorder and major depressive disorder by a psychiatrist and takes Xanax, 1 mg, four times a day. She also uses THC, taking two to three hits from a bowl once a day, which she says helps with her anxiety. Despite the medication, she appeared visibly shaky during the session and continues to feel anxious, particularly in social settings, such as grocery stores, and avoids leaving her house. She is seeking support to stop worrying so much, get out more, and learn new coping skills to manage daily life. A behavioral health counselor will consult with a psychiatric consultant to determine the next steps in her care.  Clinical Assessment   PHQ-9 Assessments:    05/28/2023   10:49 AM 05/10/2023   12:06 PM 02/22/2023   10:57 AM 12/04/2022   10:57 AM 11/28/2022    2:08 PM  Depression screen PHQ 2/9  Decreased Interest 2 2 2 3 2   Down, Depressed, Hopeless 3 2 2 2 2   PHQ - 2 Score 5 4 4 5 4   Altered sleeping 2 2 2 2 2   Tired, decreased energy 3 3 3 3 3   Change in appetite 2 0 0 2 2  Feeling bad or failure about yourself  2 2 2 3 2   Trouble concentrating 0 1 1 0 0  Moving slowly or fidgety/restless 0 0 2 0 0  Suicidal  thoughts 0 0 0 0 0  PHQ-9 Score 14 12 14 15 13   Difficult doing work/chores   Somewhat difficult Somewhat difficult Very difficult    GAD-7 Assessments:    05/28/2023   10:49 AM 05/10/2023   12:06 PM 02/22/2023   10:58 AM 12/04/2022   10:59 AM  GAD 7 : Generalized Anxiety Score  Nervous, Anxious, on Edge 3 2 2 3   Control/stop worrying 3 3 3 3   Worry too much - different things 2 3 3 3   Trouble relaxing 2 2 2 3   Restless 2 1 1  0  Easily annoyed or irritable 0 0 0 0  Afraid - awful might happen 2 2 2 2   Total GAD 7 Score 14 13 13 14   Anxiety Difficulty Very difficult  Somewhat difficult Very difficult     Social History:  Household: Lives alone Marital status: Divorced Number of Children:  Employment: Unemployed Education:   Psychiatric Review of systems: Insomnia: Fair . Trouble falling asleep sometimes. But wakes up and can't fall asleep.  Changes in appetite: Better Decreased need for sleep: No Family history of bipolar disorder: No Hallucinations: No   Paranoia: No    Psychotropic medications: Current medications: Zanax 10mg  4 times a day, impipramine 50mg  Patient taking medications as prescribed:  Yes Side effects reported: Yes  Current medications (medication list) Current Outpatient Medications on File Prior to Visit  Medication Sig Dispense Refill   acetaminophen (TYLENOL) 500 MG tablet Take 1,000 mg by mouth every 6 (six) hours as needed for moderate pain.     ALPRAZolam (XANAX) 1 MG tablet Take 1 mg by mouth 4 (four) times daily as needed for anxiety.      amLODipine (NORVASC) 10 MG tablet TAKE 1 TABLET(10 MG) BY MOUTH DAILY 90 tablet 0   fexofenadine (ALLEGRA) 180 MG tablet Take 180 mg by mouth daily as needed for allergies or rhinitis.     imipramine (TOFRANIL) 50 MG tablet Take 50 mg by mouth at bedtime.     metoprolol tartrate (LOPRESSOR) 25 MG tablet TAKE 1 TABLET(25 MG) BY MOUTH TWICE DAILY 180 tablet 0   Multiple Vitamin (MULTIVITAMIN) tablet Take 1 tablet  by mouth daily.     polyethylene glycol (MIRALAX / GLYCOLAX) 17 g packet Take 17 g by mouth daily as needed for moderate constipation.     potassium chloride SA (KLOR-CON M) 20 MEQ tablet Take 2 tablets (40 mEq total) by mouth daily for 4 days. 8 tablet 0   Probiotic Product (PROBIOTIC PO) Take 1 capsule by mouth daily.     rOPINIRole (REQUIP) 0.5 MG tablet TAKE 1 TABLET(0.5 MG) BY MOUTH AT BEDTIME 90 tablet 2   simvastatin (ZOCOR) 20 MG tablet TAKE 1 TABLET(20 MG) BY MOUTH DAILY IN THE AFTERNOON 90 tablet 1   triamcinolone cream (KENALOG) 0.1 % Apply 1 Application topically 2 (two) times daily. 30 g 0   Vitamin D, Ergocalciferol, (DRISDOL) 1.25 MG (50000 UNIT) CAPS capsule Take 1 capsule (50,000 Units  total) by mouth every 7 (seven) days. 20 capsule 1   No current facility-administered medications on file prior to visit.    Psychiatric History: Past psychiatry diagnosis: Panic disorder, MDD,  Patient currently being seen by therapist/psychiatrist: Dr. Lafayette Dragon  Prior Suicide Attempts: No Past psychiatry Hospitalization(s): No Past history of violence: No  Traumatic Experiences: History or current traumatic events (natural disaster, house fire, etc.)? no History or current physical trauma?  no History or current emotional trauma?  no History or current sexual trauma?  no History or current domestic or intimate partner violence?  no PTSD symptoms if any traumatic experiences no   Alcohol and/or Substance Use History   Tobacco Alcohol Other substances  Current use  10 years since last use THC once a day 2-3 hots off a bowl  Past use  Use to drink on weekends 2-3 mixed drinks  20 years 2-3 bowls ago  Past treatment      Withdrawal Potential: Moderate due taking Zanax  Self-harm Behaviors Risk Assessment Self-harm risk factors: Current thoughts  Patient endorses recent thoughts of harming self: Yes [place] Suicide Severity Rating Scale:   Guns in the home:  No   Protective factors:  "my kids, My son would help me if I needed and my fiend"  Danger to Others Risk Assessment Danger to others risk factors:   Patient endorses recent thoughts of harming others:   Dynamic Appraisal of Situational Aggression (DASA):   BH Counselor discussed emergency crisis plan with client and provided local emergency services resources.  Mental status exam:   General Appearance Leah Padilla:  Casual Eye Contact:  Good Motor Behavior:  Restlestness Speech:  Pressured Level of Consciousness:  Alert Mood:  Anxious Affect:  Depressed Anxiety Level:  Moderate Thought Process:  Coherent Thought Content:  WNL Perception:  Normal Judgment:  Good Insight:  Present  Diagnosis:   Goals:  Decrease social anxiety and be more active   Interventions: Mindfulness or Relaxation Training and CBT Cognitive Behavioral Therapy

## 2023-06-08 NOTE — Telephone Encounter (Signed)
Please advise the patient to provide a urine sample.

## 2023-06-11 ENCOUNTER — Other Ambulatory Visit: Payer: Self-pay

## 2023-06-11 ENCOUNTER — Other Ambulatory Visit: Payer: Self-pay | Admitting: Family Medicine

## 2023-06-11 DIAGNOSIS — R399 Unspecified symptoms and signs involving the genitourinary system: Secondary | ICD-10-CM

## 2023-06-11 NOTE — Progress Notes (Signed)
The patient was informed that her urinalysis (UA) was negative for leukocytes and nitrates. She reported being asymptomatic, with no symptoms of urgency, frequency, or pain with urination. She was encouraged to follow up if she develops fever, suprapubic pain, urinary urgency, frequency, or dysuria. The patient verbalized understanding of these instructions.

## 2023-06-12 LAB — URINALYSIS
Bilirubin, UA: NEGATIVE
Glucose, UA: NEGATIVE
Ketones, UA: NEGATIVE
Leukocytes,UA: NEGATIVE
Nitrite, UA: NEGATIVE
Protein,UA: NEGATIVE
Specific Gravity, UA: 1.015 (ref 1.005–1.030)
Urobilinogen, Ur: 0.2 mg/dL (ref 0.2–1.0)
pH, UA: 7 (ref 5.0–7.5)

## 2023-06-13 ENCOUNTER — Telehealth (INDEPENDENT_AMBULATORY_CARE_PROVIDER_SITE_OTHER): Payer: Medicaid Other | Admitting: Professional Counselor

## 2023-06-13 DIAGNOSIS — R591 Generalized enlarged lymph nodes: Secondary | ICD-10-CM | POA: Diagnosis not present

## 2023-06-13 DIAGNOSIS — D869 Sarcoidosis, unspecified: Secondary | ICD-10-CM | POA: Diagnosis not present

## 2023-06-13 DIAGNOSIS — F411 Generalized anxiety disorder: Secondary | ICD-10-CM

## 2023-06-13 NOTE — BH Specialist Note (Unsigned)
Virtual Behavioral Health Treatment Plan Team Note  MRN: [id number] NAME: Leah Padilla  DATE: 06/14/23  Start time: Start Time: 0400 End time: Stop Time: 0411 Total time: Total Time in Minutes (Visit): 11  Total number of Virtual BH Treatment Team Plan encounters: 1/4  Treatment Team Attendees: Dr. Vanetta Shawl and Esmond Harps  Collaborative Care Psychiatric Consultant Case Review    Assessment/Provisional Diagnosis Leah Padilla is a 62 y.o. year old female with history of depression, anxiety, sarcoidosis, hypertension, hyperlipidemia, marijuana use. The patient is referred for depression, anxiety. The patient is under the care by Dr. Evelene Croon, psychiatrist, and is on imipramine 50 mg at night, Xanax 1 mg QID.    # MDD, moderate, recurrent The patient experiences depressive symptoms and anxiety in the context of the following stressors. Given that she is under the care of a psychiatrist and currently taking Xanax, we anticipate potential conflicts in the treatment plan, as it is generally recommended to taper off this medication. We would recommend continuing treatment with Dr. Evelene Croon and referring her for traditional therapy.   Recommendation Referral to therapy for CBT Continue care with her psychiatrist, Dr. Evelene Croon.   Goals, Interventions and Follow-up Plan Goals: Decrease social anxiety and be more active Interventions: Mindfulness or Relaxation Training CBT Cognitive Behavioral Therapy Medication Management Recommendations: Defer Follow-up Plan:  Refer to CBT traditional therapist  History of the present illness Presenting Problem/Current Symptoms:  The patient is a 62 year old female with a history of depression, anxiety, and panic disorder. She presents with a chief complaint of financial fear, isolation, difficulty with day-to-day functioning, loss of interest in pleasurable activities, panic attacks, and excessive worrying. About a year ago, she was misdiagnosed with Bell's Palsy  and lost 50 pounds while experiencing frequent illness, which led to extended leave from work. After extensive medical evaluations, she was eventually diagnosed with Sarcoidosis and is now working with a specialist to find appropriate treatment. She has been out of work for over a year and was recently laid off, leading to significant fear around her financial situation. Although she has filed for and received disability benefits, she now worries about potentially losing them and is anxious about taxes.   The patient has tried multiple medications, including Wellbutrin and Zoloft, but reports difficulty tolerating them for more than a day or two. She has two children for support and currently lives with her son, though he also struggles with social anxiety and mental health issues, limiting his ability to fully support her. She describes having low self-esteem, which worsened after her traumatic divorce in 2003 from a narcissistic partner. Although she experienced a period of success when she felt confident and was working, the deaths of her father in 66 and her mother in 2001 contributed significantly to her depression.   The patient often experiences passive suicidal thoughts, feeling that life would be easier if she did not have to live, but denies any plan or intent to harm herself. A behavioral health counselor assessed her for suicidality and determined she is at moderate risk due to her ongoing suicidal thoughts but is not an immediate danger. The patient identifies her children as protective factors, stating they are the main reason she would never hurt herself. She also mentioned that she could call her son for support if needed. Emergency resources and a safety plan were discussed with her, and she accepted them.   She is currently being treated for panic disorder and major depressive disorder by a psychiatrist and takes Xanax,  1 mg, four times a day. She also uses THC, taking two to three hits  from a bowl once a day, which she says helps with her anxiety. Despite the medication, she appeared visibly shaky during the session and continues to feel anxious, particularly in social settings, such as grocery stores, and avoids leaving her house. She is seeking support to stop worrying so much, get out more, and learn new coping skills to manage daily life. A behavioral health counselor will consult with a psychiatric consultant to determine the next steps in her care.   Screenings PHQ-9 Assessments:     06/14/2023   11:10 AM 06/07/2023    1:21 PM 05/28/2023   10:49 AM  Depression screen PHQ 2/9  Decreased Interest 2 3 2   Down, Depressed, Hopeless 3 2 3   PHQ - 2 Score 5 5 5   Altered sleeping 2 2 2   Tired, decreased energy 3 3 3   Change in appetite 2 2 2   Feeling bad or failure about yourself  3 2 2   Trouble concentrating 1 1 0  Moving slowly or fidgety/restless 1 0 0  Suicidal thoughts 1 0 0  PHQ-9 Score 18 15 14   Difficult doing work/chores  Somewhat difficult    GAD-7 Assessments:     06/07/2023    1:32 PM 05/28/2023   10:49 AM 05/10/2023   12:06 PM 02/22/2023   10:58 AM  GAD 7 : Generalized Anxiety Score  Nervous, Anxious, on Edge 2 3 2 2   Control/stop worrying 3 3 3 3   Worry too much - different things 3 2 3 3   Trouble relaxing 2 2 2 2   Restless 2 2 1 1   Easily annoyed or irritable 1 0 0 0  Afraid - awful might happen 2 2 2 2   Total GAD 7 Score 15 14 13 13   Anxiety Difficulty Very difficult Very difficult  Somewhat difficult    Past Medical History Past Medical History:  Diagnosis Date   Anxiety    Bell's palsy    Depression    High cholesterol    Hypertension     Vital signs: There were no vitals filed for this visit.  Allergies:  Allergies as of 06/13/2023 - Review Complete 05/28/2023  Allergen Reaction Noted   Doxycycline Nausea Only 07/23/2019   Penicillins Hives and Nausea Only 09/12/2015   Ciprofloxacin Other (See Comments) 09/09/2021   Estrace  [estradiol]  07/23/2019   Mirtazapine  11/28/2022   Nitrofurantoin Other (See Comments) 03/27/2022   Polymox [amoxicillin] Hives 09/12/2015   Progesterone  07/23/2019    Medication History Current medications:  Outpatient Encounter Medications as of 06/13/2023  Medication Sig   acetaminophen (TYLENOL) 500 MG tablet Take 1,000 mg by mouth every 6 (six) hours as needed for moderate pain.   ALPRAZolam (XANAX) 1 MG tablet Take 1 mg by mouth 4 (four) times daily as needed for anxiety.    amLODipine (NORVASC) 10 MG tablet TAKE 1 TABLET(10 MG) BY MOUTH DAILY   fexofenadine (ALLEGRA) 180 MG tablet Take 180 mg by mouth daily as needed for allergies or rhinitis.   imipramine (TOFRANIL) 50 MG tablet Take 50 mg by mouth at bedtime.   metoprolol tartrate (LOPRESSOR) 25 MG tablet TAKE 1 TABLET(25 MG) BY MOUTH TWICE DAILY   Multiple Vitamin (MULTIVITAMIN) tablet Take 1 tablet by mouth daily.   polyethylene glycol (MIRALAX / GLYCOLAX) 17 g packet Take 17 g by mouth daily as needed for moderate constipation.   potassium chloride SA (KLOR-CON  M) 20 MEQ tablet Take 2 tablets (40 mEq total) by mouth daily for 4 days.   Probiotic Product (PROBIOTIC PO) Take 1 capsule by mouth daily.   rOPINIRole (REQUIP) 0.5 MG tablet TAKE 1 TABLET(0.5 MG) BY MOUTH AT BEDTIME   simvastatin (ZOCOR) 20 MG tablet TAKE 1 TABLET(20 MG) BY MOUTH DAILY IN THE AFTERNOON   triamcinolone cream (KENALOG) 0.1 % Apply 1 Application topically 2 (two) times daily.   Vitamin D, Ergocalciferol, (DRISDOL) 1.25 MG (50000 UNIT) CAPS capsule Take 1 capsule (50,000 Units total) by mouth every 7 (seven) days.   No facility-administered encounter medications on file as of 06/13/2023.     Scribe for Treatment Team: Reuel Boom

## 2023-06-14 ENCOUNTER — Encounter: Payer: Self-pay | Admitting: Emergency Medicine

## 2023-06-14 ENCOUNTER — Other Ambulatory Visit: Payer: Self-pay

## 2023-06-14 ENCOUNTER — Ambulatory Visit
Admission: EM | Admit: 2023-06-14 | Discharge: 2023-06-14 | Disposition: A | Discharge status: Home / Self Care | Payer: Medicaid Other | Attending: Family Medicine | Admitting: Family Medicine

## 2023-06-14 ENCOUNTER — Ambulatory Visit: Payer: Medicaid Other | Admitting: Professional Counselor

## 2023-06-14 DIAGNOSIS — F411 Generalized anxiety disorder: Secondary | ICD-10-CM

## 2023-06-14 DIAGNOSIS — L03113 Cellulitis of right upper limb: Secondary | ICD-10-CM

## 2023-06-14 MED ORDER — MUPIROCIN CALCIUM 2 % EX CREA: 1.0000 | TOPICAL_CREAM | Freq: Two times a day (BID) | CUTANEOUS | 0 refills | Status: DC

## 2023-06-14 MED ORDER — SULFAMETHOXAZOLE-TRIMETHOPRIM 800-160 MG PO TABS: 1.0000 | ORAL_TABLET | Freq: Two times a day (BID) | ORAL | 0 refills | Status: AC

## 2023-06-14 NOTE — Patient Instructions (Addendum)
If your symptoms worsen or you have thoughts of suicide/homicide, PLEASE SEEK IMMEDIATE MEDICAL ATTENTION.  You may always call:   National Suicide Hotline: 988 or 504-082-0989 [facility] Crisis Line: 660-553-3727 Crisis Recovery in [place]: [phone]     These are available 24 hours a day, 7 days a week.   CBT therapy referral.  [facility]  [address]  [address]  [place], [place] [address]  (In the [facility])  [email or web address]  [phone]

## 2023-06-14 NOTE — BH Specialist Note (Signed)
[facility] Follow-up  MRN: [id number] NAME: Leah Padilla Date: 06/14/23  Start time: Start Time: 1100 End time: Stop Time: 1130 Total time: Total Time in Minutes (Visit): 30 Call number: Visit Number: 3- Third Visit  Reason for call today: The patient is a 62 year old female returning for a collaborative care follow-up to discuss the findings from her psychiatric consultation and receive recommendations and resources. At this time, she is not an appropriate fit for collaborative care as she is already connected with psychiatry and requires traditional CBT therapy. The Behavioral Counselor explained the rationale behind these recommendations, and the patient understood and agreed. She was provided with resources for a local therapy agency offering ongoing CBT and was advised to continue seeing her current psychiatrist for medication management.  During today's session, the patient presented with similar symptoms and elevated PHQ-9 and GAD-7 scores. The [employer] Counselor educated her on the use of Xanax for symptom relief, explaining that while it can be effective, it may hinder her ability to practice non-medication-based techniques for relief, as she has become reliant on the Xanax. The potential for Xanax abuse was also discussed, with the counselor noting that the patient is now taking Xanax up to four times a day, indicating a possible increase in tolerance, which may explain why she is no longer experiencing the desired effects and continues to feel heightened anxiety. The patient understood this and will consult with her psychiatrist about her Xanax use. We will sign off on this patient.   PHQ-9 Scores:     06/14/2023   11:10 AM 06/07/2023    1:21 PM 05/28/2023   10:49 AM 05/10/2023   12:06 PM 02/22/2023   10:57 AM  Depression screen PHQ 2/9  Decreased Interest 2 3 2 2 2   Down, Depressed, Hopeless 3 2 3 2 2   PHQ - 2 Score 5 5 5 4 4   Altered sleeping 2 2 2 2 2   Tired, decreased energy  3 3 3 3 3   Change in appetite 2 2 2  0 0  Feeling bad or failure about yourself  3 2 2 2 2   Trouble concentrating 1 1 0 1 1  Moving slowly or fidgety/restless 1 0 0 0 2  Suicidal thoughts 1 0 0 0 0  PHQ-9 Score 18 15 14 12 14   Difficult doing work/chores  Somewhat difficult   Somewhat difficult   GAD-7 Scores:     06/07/2023    1:32 PM 05/28/2023   10:49 AM 05/10/2023   12:06 PM 02/22/2023   10:58 AM  GAD 7 : Generalized Anxiety Score  Nervous, Anxious, on Edge 2 3 2 2   Control/stop worrying 3 3 3 3   Worry too much - different things 3 2 3 3   Trouble relaxing 2 2 2 2   Restless 2 2 1 1   Easily annoyed or irritable 1 0 0 0  Afraid - awful might happen 2 2 2 2   Total GAD 7 Score 15 14 13 13   Anxiety Difficulty Very difficult Very difficult  Somewhat difficult    Stress Current stressors:  Financial stress, medical condition Sleep:  Fair Appetite:  fair Coping ability:  Fair Patient taking medications as prescribed:  Yes  Current medications:  Outpatient Encounter Medications as of 06/14/2023  Medication Sig   acetaminophen (TYLENOL) 500 MG tablet Take 1,000 mg by mouth every 6 (six) hours as needed for moderate pain.   ALPRAZolam (XANAX) 1 MG tablet Take 1 mg by mouth 4 (four) times daily  as needed for anxiety.    amLODipine (NORVASC) 10 MG tablet TAKE 1 TABLET(10 MG) BY MOUTH DAILY   fexofenadine (ALLEGRA) 180 MG tablet Take 180 mg by mouth daily as needed for allergies or rhinitis.   imipramine (TOFRANIL) 50 MG tablet Take 50 mg by mouth at bedtime.   metoprolol tartrate (LOPRESSOR) 25 MG tablet TAKE 1 TABLET(25 MG) BY MOUTH TWICE DAILY   Multiple Vitamin (MULTIVITAMIN) tablet Take 1 tablet by mouth daily.   polyethylene glycol (MIRALAX / GLYCOLAX) 17 g packet Take 17 g by mouth daily as needed for moderate constipation.   potassium chloride SA (KLOR-CON M) 20 MEQ tablet Take 2 tablets (40 mEq total) by mouth daily for 4 days.   Probiotic Product (PROBIOTIC PO) Take 1 capsule by  mouth daily.   rOPINIRole (REQUIP) 0.5 MG tablet TAKE 1 TABLET(0.5 MG) BY MOUTH AT BEDTIME   simvastatin (ZOCOR) 20 MG tablet TAKE 1 TABLET(20 MG) BY MOUTH DAILY IN THE AFTERNOON   triamcinolone cream (KENALOG) 0.1 % Apply 1 Application topically 2 (two) times daily.   Vitamin D, Ergocalciferol, (DRISDOL) 1.25 MG (50000 UNIT) CAPS capsule Take 1 capsule (50,000 Units total) by mouth every 7 (seven) days.   No facility-administered encounter medications on file as of 06/14/2023.     Self-harm Behaviors Risk Assessment Self-harm risk factors:  Past thoughts of self harm Patient endorses recent thoughts of harming self:  Denies   Danger to Others Risk Assessment Danger to others risk factors:  None Patient endorses recent thoughts of harming others:  Denies   Goals, Interventions and Follow-up Plan Goals: Increase healthy adjustment to current life circumstances Interventions: CBT Cognitive Behavioral Therapy Follow-up Plan: Refer to Psychiatrist for Medication Management   Reuel Boom

## 2023-06-14 NOTE — ED Provider Notes (Signed)
[facility]    CSN: [id number] Arrival date & time: 06/14/23  1154      History   Chief Complaint Chief Complaint  Patient presents with   Insect Bite    HPI Leah Padilla is a 62 y.o. female.   Patient presenting today with what started out looking like an insect bite to the right forearm and is now a blister with redness and swelling, drainage.  Denies fever, chills, body aches, nausea, vomiting.  Trying to use topical antibacterial creams with no relief.    Past Medical History:  Diagnosis Date   Anxiety    Bell's palsy    Depression    High cholesterol    Hypertension     Patient Active Problem List   Diagnosis Date Noted   Stage 3b chronic kidney disease (CKD) (HCC)    Positive colorectal cancer screening using Cologuard test 07/25/2022   Acute cystitis with hematuria 06/29/2022   History of hypercholesterolemia 06/29/2022   Restless legs syndrome 06/29/2022   Rash and nonspecific skin eruption 06/13/2022   Chronic sinusitis of both maxillary sinuses 06/13/2022   Left-sided Bell's palsy 05/17/2022   Constipation 05/17/2022   Absolute anemia 02/14/2022   Hypophosphatemia 01/17/2022   Hypomagnesemia 01/17/2022   HLD (hyperlipidemia) 01/17/2022   History of Bell's palsy 01/17/2022   Anxiety 01/17/2022   Nausea and vomiting 01/17/2022   Lymphadenopathy, generalized    Hypercalcemia 01/13/2022   AKI (acute kidney injury) (HCC) 01/13/2022   Acute metabolic encephalopathy 01/13/2022   Hypokalemia 01/13/2022   Essential hypertension 01/13/2022   Recurrent UTI 08/03/2021   Burning with urination 08/03/2021   Vaginal atrophy 08/03/2021   History of UTI 02/25/2020   Urinary tract infection with hematuria 02/11/2020   Urinary frequency 02/11/2020   Vulvar irritation 07/23/2019   Vaginal discharge 07/23/2019   Allergic reaction 07/23/2019   Rectocele 07/16/2019   Vaginal dryness, menopausal 07/16/2019   Hot flashes 07/16/2019   Hematuria  07/16/2019   Dysuria 07/16/2019   Psychotic disorder (HCC) 03/13/2018    Past Surgical History:  Procedure Laterality Date   AXILLARY LYMPH NODE BIOPSY Right 01/17/2022   Procedure: AXILLARY LYMPH NODE BIOPSY;  Surgeon: Lewie Chamber, DO;  Location: [facility];  Service: General;  Laterality: Right;   BIOPSY  08/01/2022   Procedure: BIOPSY;  Surgeon: Lanelle Bal, DO;  Location: [facility];  Service: Endoscopy;;   COLONOSCOPY N/A 04/17/2013   Procedure: COLONOSCOPY;  Surgeon: Malissa Hippo, MD;  Location: [facility];  Service: Endoscopy;  Laterality: N/A;  930   COLONOSCOPY WITH PROPOFOL N/A 08/01/2022   Procedure: COLONOSCOPY WITH PROPOFOL;  Surgeon: Lanelle Bal, DO;  Location: [facility];  Service: Endoscopy;  Laterality: N/A;  1:30 pm   CYST EXCISION     neck    OB History     Gravida  2   Para  2   Term  2   Preterm      AB      Living  2      SAB      IAB      Ectopic      Multiple      Live Births  2            Home Medications    Prior to Admission medications   Medication Sig Start Date End Date Taking? Authorizing Provider  mupirocin cream (BACTROBAN) 2 % Apply 1 Application topically 2 (two) times daily. 06/14/23  Yes Particia Nearing, PA-C  sulfamethoxazole-trimethoprim (BACTRIM DS) 800-160 MG tablet Take 1 tablet by mouth 2 (two) times daily for 7 days. 06/14/23 06/21/23 Yes Particia Nearing, PA-C  acetaminophen (TYLENOL) 500 MG tablet Take 1,000 mg by mouth every 6 (six) hours as needed for moderate pain.    [provider]  ALPRAZolam Prudy Feeler) 1 MG tablet Take 1 mg by mouth 4 (four) times daily as needed for anxiety.  09/08/15   [provider]  amLODipine (NORVASC) 10 MG tablet TAKE 1 TABLET(10 MG) BY MOUTH DAILY 03/08/23   Kerri Perches, MD  fexofenadine (ALLEGRA) 180 MG tablet Take 180 mg by mouth daily as needed for allergies or rhinitis.    [provider]  imipramine  (TOFRANIL) 50 MG tablet Take 50 mg by mouth at bedtime.    [provider]  metoprolol tartrate (LOPRESSOR) 25 MG tablet TAKE 1 TABLET(25 MG) BY MOUTH TWICE DAILY 04/09/23   Kerri Perches, MD  Multiple Vitamin (MULTIVITAMIN) tablet Take 1 tablet by mouth daily.    [provider]  polyethylene glycol (MIRALAX / GLYCOLAX) 17 g packet Take 17 g by mouth daily as needed for moderate constipation.    [provider]  potassium chloride SA (KLOR-CON M) 20 MEQ tablet Take 2 tablets (40 mEq total) by mouth daily for 4 days. 07/30/22 05/10/23  Vassie Loll, MD  Probiotic Product (PROBIOTIC PO) Take 1 capsule by mouth daily.    [provider]  rOPINIRole (REQUIP) 0.5 MG tablet TAKE 1 TABLET(0.5 MG) BY MOUTH AT BEDTIME 07/26/22   Gilmore Laroche, FNP  simvastatin (ZOCOR) 20 MG tablet TAKE 1 TABLET(20 MG) BY MOUTH DAILY IN THE AFTERNOON 05/21/23   Kerri Perches, MD  triamcinolone cream (KENALOG) 0.1 % Apply 1 Application topically 2 (two) times daily. 06/30/22   Anabel Halon, MD  Vitamin D, Ergocalciferol, (DRISDOL) 1.25 MG (50000 UNIT) CAPS capsule Take 1 capsule (50,000 Units total) by mouth every 7 (seven) days. 05/30/23   Gilmore Laroche, FNP    Family History Family History  Problem Relation Age of Onset   Multiple myeloma Mother    Stroke Father    Breast cancer Sister    Stroke Other    Diabetes Other    Colon cancer Neg Hx     Social History Social History   Tobacco Use   Smoking status: Former    Current packs/day: 0.00    Average packs/day: 0.5 packs/day for 13.0 years (6.5 ttl pk-yrs)    Types: Cigarettes    Start date: 12/14/2003    Quit date: 12/13/2016    Years since quitting: 6.5   Smokeless tobacco: Never  Vaping Use   Vaping status: Former  Substance Use Topics   Alcohol use: Not Currently    Comment: occasional   Drug use: Yes    Frequency: 3.0 times per week    Types: Marijuana    Comment: last used 07/30/22      Allergies   Doxycycline, Penicillins, Ciprofloxacin, Estrace [estradiol], Mirtazapine, Nitrofurantoin, Polymox [amoxicillin], and Progesterone   Review of Systems Review of Systems PER HPI  Physical Exam Triage Vital Signs ED Triage Vitals  Encounter Vitals Group     BP 06/14/23 1259 103/69     Systolic BP Percentile --      Diastolic BP Percentile --      Pulse Rate 06/14/23 1259 77     Resp 06/14/23 1259 20     Temp 06/14/23 1259  98 F (36.7 C)     Temp Source 06/14/23 1259 Oral     SpO2 06/14/23 1259 97 %     Weight --      Height --      Head Circumference --      Peak Flow --      Pain Score 06/14/23 1258 0     Pain Loc --      Pain Education --      Exclude from Growth Chart --    No data found.  Updated Vital Signs BP 103/69 (BP Location: Right Arm)   Pulse 77   Temp 98 F (36.7 C) (Oral)   Resp 20   SpO2 97%   Visual Acuity Right Eye Distance:   Left Eye Distance:   Bilateral Distance:    Right Eye Near:   Left Eye Near:    Bilateral Near:     Physical Exam Vitals and nursing note reviewed.  Constitutional:      Appearance: Normal appearance. She is not ill-appearing.  HENT:     Head: Atraumatic.  Eyes:     Extraocular Movements: Extraocular movements intact.     Conjunctiva/sclera: Conjunctivae normal.  Cardiovascular:     Rate and Rhythm: Normal rate and regular rhythm.     Heart sounds: Normal heart sounds.  Pulmonary:     Effort: Pulmonary effort is normal.     Breath sounds: Normal breath sounds.  Musculoskeletal:        General: Normal range of motion.     Cervical back: Normal range of motion and neck supple.  Skin:    General: Skin is warm.     Findings: Erythema present.     Comments: Firm blister to the right forearm proximally, surrounding erythema, edema, warmth  Neurological:     Mental Status: She is alert and oriented to person, place, and time.     Comments: Right upper extremity neurovascularly intact   Psychiatric:        Mood and Affect: Mood normal.        Thought Content: Thought content normal.        Judgment: Judgment normal.      UC Treatments / Results  Labs (all labs ordered are listed, but only abnormal results are displayed) Labs Reviewed - No data to display  EKG   Radiology No results found.  Procedures Procedures (including critical care time)  Medications Ordered in UC Medications - No data to display  Initial Impression / Assessment and Plan / UC Course  I have reviewed the triage vital signs and the nursing notes.  Pertinent labs & imaging results that were available during my care of the patient were reviewed by me and considered in my medical decision making (see chart for details).     Treat with Bactrim, mupirocin cream, warm compresses, good home wound care.  Return for worsening symptoms.  Final Clinical Impressions(s) / UC Diagnoses   Final diagnoses:  Cellulitis of right arm   Discharge Instructions   None    ED Prescriptions     Medication Sig Dispense Auth. Provider   sulfamethoxazole-trimethoprim (BACTRIM DS) 800-160 MG tablet Take 1 tablet by mouth 2 (two) times daily for 7 days. 14 tablet Particia Nearing, [place]   mupirocin cream (BACTROBAN) 2 % Apply 1 Application topically 2 (two) times daily. 30 g Particia Nearing, [place]      PDMP not reviewed this encounter.   Particia Nearing, [place] 06/14/23  1356  

## 2023-06-14 NOTE — ED Triage Notes (Addendum)
Pt reports possible insect bite to right upper forearm. States "started out as what I thought was a mosquito bite" and is now "blister-like". Pt reports redness and drainage noted to site since Tuesday evening.

## 2023-06-19 DIAGNOSIS — D869 Sarcoidosis, unspecified: Secondary | ICD-10-CM | POA: Diagnosis not present

## 2023-07-15 ENCOUNTER — Other Ambulatory Visit: Payer: Self-pay | Admitting: Family Medicine

## 2023-07-24 DIAGNOSIS — G510 Bell's palsy: Secondary | ICD-10-CM | POA: Diagnosis not present

## 2023-07-24 DIAGNOSIS — D869 Sarcoidosis, unspecified: Secondary | ICD-10-CM | POA: Diagnosis not present

## 2023-07-24 DIAGNOSIS — G250 Essential tremor: Secondary | ICD-10-CM | POA: Diagnosis not present

## 2023-08-06 ENCOUNTER — Other Ambulatory Visit: Payer: Self-pay | Admitting: Family Medicine

## 2023-08-06 DIAGNOSIS — Z1231 Encounter for screening mammogram for malignant neoplasm of breast: Secondary | ICD-10-CM

## 2023-08-08 ENCOUNTER — Other Ambulatory Visit: Payer: Self-pay | Admitting: Family Medicine

## 2023-08-20 ENCOUNTER — Ambulatory Visit: Payer: Self-pay | Admitting: Family Medicine

## 2023-08-20 NOTE — Telephone Encounter (Signed)
Copied from CRM [id number]. Topic: Clinical - Medical Advice >> Aug 20, 2023  4:16 PM Leah Padilla wrote: Reason for NWG:NFAOZHY wants schedule appt for UTI there is no slot  open. She wants to talk to someone on what to do.  Chief Complaint: pressure with urination Symptoms: pressure with urination and blood in urine - patient states it is normal for her to have blood in urine, also having increase frequency Frequency: s/s ongoing for 1 week or so Pertinent Negatives: Patient denies back pain Disposition: [] ED /[] Urgent Care (no appt availability in office) / [] Appointment(In office/virtual)/ []  [facility]/ [] Home Care/ [] Refused Recommended Disposition /[] [facility]/ [x]  Follow-up with PCP Additional Notes: pt c/o increased urinary frequency,  and pressure with urination, pt stated some blood in urine but that is normal.  Pt states no fever and no back pain.  Attempt to schedule appt with PCP but appt available at present time: pt request to be worked in with PCP. Answer Assessment - Initial Assessment Questions 1. SEVERITY: "How bad is the pain?"  (e.g., Scale 1-10; mild, moderate, or severe)   - MILD (1-3): complains slightly about urination hurting   - MODERATE (4-7): interferes with normal activities     - SEVERE (8-10): excruciating, unwilling or unable to urinate because of the pain      Pressure but no pain 2. FREQUENCY: "How many times have you had painful urination today?"      Ongoing since 1 week or so 3. PATTERN: "Is pain present every time you urinate or just sometimes?"      Pressure each time 4. ONSET: "When did the painful urination start?"      1 week or so 5. FEVER: "Do you have a fever?" If Yes, ask: "What is your temperature, how was it measured, and when did it start?"     no 6. PAST UTI: "Have you had a urine infection before?" If Yes, ask: "When was the last time?" and "What happened that time?"      Yes -and received medication in the past  7.  CAUSE: "What do you think is causing the painful urination?"  (e.g., UTI, scratch, Herpes sore)     unknown 8. OTHER SYMPTOMS: "Do you have any other symptoms?" (e.g., blood in urine, flank pain, genital sores, urgency, vaginal discharge)     Blood in urine - bur normally has blood in urine  9. PREGNANCY: "Is there any chance you are pregnant?" "When was your last menstrual period?"     no  Protocols used: Urination Pain - Female-A-AH

## 2023-08-23 ENCOUNTER — Ambulatory Visit (INDEPENDENT_AMBULATORY_CARE_PROVIDER_SITE_OTHER): Payer: Medicaid Other | Admitting: *Deleted

## 2023-08-23 ENCOUNTER — Ambulatory Visit: Payer: Self-pay | Admitting: Family Medicine

## 2023-08-23 DIAGNOSIS — R319 Hematuria, unspecified: Secondary | ICD-10-CM | POA: Diagnosis not present

## 2023-08-23 DIAGNOSIS — R300 Dysuria: Secondary | ICD-10-CM

## 2023-08-23 DIAGNOSIS — R350 Frequency of micturition: Secondary | ICD-10-CM

## 2023-08-23 LAB — POCT URINALYSIS DIPSTICK OB
Glucose, UA: NEGATIVE
Ketones, UA: NEGATIVE
Nitrite, UA: NEGATIVE
POC,PROTEIN,UA: NEGATIVE

## 2023-08-23 NOTE — Progress Notes (Unsigned)
   NURSE VISIT- UTI SYMPTOMS   SUBJECTIVE:  Leah Padilla is a 62 y.o. G53P2002 female here for UTI symptoms. She is a GYN patient. She reports hematuria, lower abdominal pain, and urinary frequency. Most recent UTI in August and completed course of Bactrim.   OBJECTIVE:  There were no vitals taken for this visit.  Appears well, in no apparent distress  Results for orders placed or performed in visit on 08/23/23 (from the past 24 hour(s))  POC Urinalysis Dipstick OB   Collection Time: 08/23/23  1:43 PM  Result Value Ref Range   Color, UA     Clarity, UA     Glucose, UA Negative Negative   Bilirubin, UA     Ketones, UA neg    Spec Grav, UA     Blood, UA trace    pH, UA     POC,PROTEIN,UA Negative Negative, Trace, Small (1+), Moderate (2+), Large (3+), 4+   Urobilinogen, UA     Nitrite, UA neg    Leukocytes, UA Small (1+) (A) Negative   Appearance     Odor      ASSESSMENT: GYN patient with UTI symptoms and negative nitrites  PLAN: Note routed to Cyril Mourning, AGNP   Rx sent by provider today: Yes, patient is requesting Bactrim Urine culture sent Call or return to clinic prn if these symptoms worsen or fail to improve as anticipated. Follow-up: as needed   Jobe Marker  08/23/2023 2:24 PM

## 2023-08-24 LAB — URINALYSIS, ROUTINE W REFLEX MICROSCOPIC
Bilirubin, UA: NEGATIVE
Glucose, UA: NEGATIVE
Ketones, UA: NEGATIVE
Nitrite, UA: NEGATIVE
Protein,UA: NEGATIVE
RBC, UA: NEGATIVE
Specific Gravity, UA: <=1.005 — AB (ref 1.005–1.030)
Urobilinogen, Ur: 0.2 mg/dL (ref 0.2–1.0)
pH, UA: 7.5 (ref 5.0–7.5)

## 2023-08-24 LAB — MICROSCOPIC EXAMINATION
Bacteria, UA: NONE SEEN
Casts: NONE SEEN /[LPF]
Epithelial Cells (non renal): NONE SEEN /[HPF] (ref 0–10)
RBC, Urine: NONE SEEN /[HPF] (ref 0–2)

## 2023-08-29 ENCOUNTER — Telehealth: Payer: Self-pay | Admitting: *Deleted

## 2023-08-29 LAB — URINE CULTURE

## 2023-08-29 MED ORDER — NITROFURANTOIN MONOHYD MACRO 100 MG PO CAPS: 100.0000 mg | ORAL_CAPSULE | Freq: Two times a day (BID) | ORAL | 0 refills | Status: DC

## 2023-08-29 NOTE — Telephone Encounter (Signed)
Patient called requesting antibiotic for UTI.  Culture positive that was sent out on 11/21.  Advised medication will be sent and to check back with pharmacy later this afternoon.

## 2023-09-04 ENCOUNTER — Telehealth: Payer: Self-pay | Admitting: Adult Health

## 2023-09-04 ENCOUNTER — Telehealth: Payer: Self-pay

## 2023-09-04 NOTE — Telephone Encounter (Signed)
Patient would like for Victorino Dike to give her a call back, she has more questions.

## 2023-09-04 NOTE — Telephone Encounter (Signed)
Pt taking macrobid without any problems can check POT after finished by 3-4 days

## 2023-09-04 NOTE — Telephone Encounter (Signed)
Returned patient's call. States she didn't know exactly what Leah Padilla said when she called earlier.  Note from chart read to patient with no further questions.

## 2023-10-01 ENCOUNTER — Ambulatory Visit (INDEPENDENT_AMBULATORY_CARE_PROVIDER_SITE_OTHER): Payer: Medicaid Other | Admitting: Family Medicine

## 2023-10-01 ENCOUNTER — Encounter: Payer: Self-pay | Admitting: Family Medicine

## 2023-10-01 VITALS — BP 106/71 | HR 78 | Ht 68.0 in | Wt 140.0 lb

## 2023-10-01 DIAGNOSIS — E7849 Other hyperlipidemia: Secondary | ICD-10-CM | POA: Diagnosis not present

## 2023-10-01 DIAGNOSIS — E038 Other specified hypothyroidism: Secondary | ICD-10-CM

## 2023-10-01 DIAGNOSIS — G250 Essential tremor: Secondary | ICD-10-CM | POA: Insufficient documentation

## 2023-10-01 DIAGNOSIS — E559 Vitamin D deficiency, unspecified: Secondary | ICD-10-CM

## 2023-10-01 DIAGNOSIS — I10 Essential (primary) hypertension: Secondary | ICD-10-CM | POA: Diagnosis not present

## 2023-10-01 DIAGNOSIS — G2581 Restless legs syndrome: Secondary | ICD-10-CM | POA: Diagnosis not present

## 2023-10-01 DIAGNOSIS — R7301 Impaired fasting glucose: Secondary | ICD-10-CM

## 2023-10-01 MED ORDER — AMLODIPINE BESYLATE 10 MG PO TABS: ORAL_TABLET | ORAL | 0 refills | Status: DC

## 2023-10-01 MED ORDER — METOPROLOL TARTRATE 25 MG PO TABS: 25.0000 mg | ORAL_TABLET | Freq: Two times a day (BID) | ORAL | 2 refills | Status: DC

## 2023-10-01 MED ORDER — SIMVASTATIN 20 MG PO TABS: ORAL_TABLET | ORAL | 1 refills | Status: DC

## 2023-10-01 MED ORDER — ROPINIROLE HCL 0.5 MG PO TABS: 0.5000 mg | ORAL_TABLET | Freq: Every day | ORAL | 2 refills | Status: DC

## 2023-10-01 NOTE — Assessment & Plan Note (Signed)
The patient's condition is currently controlled and asymptomatic She is taking Amlodipine 10 mg daily and Metoprolol 25 mg twice daily She reports compliance with current treatment regimen Low-sodium diet with increase physical activity encouraged BP Readings from Last 3 Encounters:  10/01/23 106/71  06/14/23 103/69  05/28/23 93/63

## 2023-10-01 NOTE — Assessment & Plan Note (Addendum)
The patient reports that her condition is currently stable and will defer pharmacological therapy therapy with gabapentin 100 mg nightly today The patient is encouraged to follow-up if her symptoms worsens or interfere with her ADLs

## 2023-10-01 NOTE — Assessment & Plan Note (Signed)
Refill sent to the pharmacy 

## 2023-10-01 NOTE — Patient Instructions (Signed)
I appreciate the opportunity to provide care to you today!    Follow up:  4 months  Labs: please stop by the lab today to get your blood drawn (CBC, CMP, TSH, Lipid profile, HgA1c, Vit D)   Attached with your AVS, you will find valuable resources for self-education. I highly recommend dedicating some time to thoroughly examine them.   Please continue to a heart-healthy diet and increase your physical activities. Try to exercise for 30mins at least five days a week.    It was a pleasure to see you and I look forward to continuing to work together on your health and well-being. Please do not hesitate to call the office if you need care or have questions about your care.  In case of emergency, please visit the Emergency Department for urgent care, or contact our clinic at [phone] to schedule an appointment. We're here to help you!   Have a wonderful day and week. With Gratitude, Kylan Veach MSN, FNP-BC  

## 2023-10-01 NOTE — Assessment & Plan Note (Signed)
The patient is currently taking Simvastatin 20 mg daily and reports no muscle aches or pain. She is encouraged to reduce her intake of saturated fats, trans fats, and cholesterol, while increasing her consumption of fruits, vegetables, whole grains, and omega-3 fatty acids, along with engaging in increased physical activity. Lab Results  Component Value Date   CHOL 147 05/28/2023   HDL 45 05/28/2023   LDLCALC 84 05/28/2023   TRIG 98 05/28/2023   CHOLHDL 3.3 05/28/2023

## 2023-10-01 NOTE — Progress Notes (Signed)
Established Patient Office Visit  Subjective:  Patient ID: Leah Padilla, female    DOB: 07/27/1961  Age: 62 y.o. MRN: [id number]  CC:  Chief Complaint  Patient presents with   Care Management    4 month f/u, states she saw neurology and was adviced  If the tremor gets worse, PCP can consider starting low dosage of 100 mg Gabapentin nightly.     HPI Leah Padilla is a 62 y.o. female with past medical history of hypertension, hyperlipidemia, essential tremors and vitamin D deficiency presents for f/u of  chronic medical conditions. For the details of today's visit, please refer to the assessment and plan.     Past Medical History:  Diagnosis Date   Anxiety    Bell's palsy    Depression    High cholesterol    Hypertension     Past Surgical History:  Procedure Laterality Date   AXILLARY LYMPH NODE BIOPSY Right 01/17/2022   Procedure: AXILLARY LYMPH NODE BIOPSY;  Surgeon: Lewie Chamber, DO;  Location: [facility];  Service: General;  Laterality: Right;   BIOPSY  08/01/2022   Procedure: BIOPSY;  Surgeon: Lanelle Bal, DO;  Location: [facility];  Service: Endoscopy;;   COLONOSCOPY N/A 04/17/2013   Procedure: COLONOSCOPY;  Surgeon: Malissa Hippo, MD;  Location: [facility];  Service: Endoscopy;  Laterality: N/A;  930   COLONOSCOPY WITH PROPOFOL N/A 08/01/2022   Procedure: COLONOSCOPY WITH PROPOFOL;  Surgeon: Lanelle Bal, DO;  Location: [facility];  Service: Endoscopy;  Laterality: N/A;  1:30 pm   CYST EXCISION     neck    Family History  Problem Relation Age of Onset   Multiple myeloma Mother    Stroke Father    Breast cancer Sister    Stroke Other    Diabetes Other    Colon cancer Neg Hx     Social History   Socioeconomic History   Marital status: Divorced    Spouse name: Not on file   Number of children: 2   Years of education: Not on file   Highest education level: Not on file  Occupational History   Not on file  Tobacco Use    Smoking status: Former    Current packs/day: 0.00    Average packs/day: 0.5 packs/day for 13.0 years (6.5 ttl pk-yrs)    Types: Cigarettes    Start date: 12/14/2003    Quit date: 12/13/2016    Years since quitting: 6.8   Smokeless tobacco: Never  Vaping Use   Vaping status: Former  Substance and Sexual Activity   Alcohol use: Not Currently    Comment: occasional   Drug use: Yes    Frequency: 3.0 times per week    Types: Marijuana    Comment: last used 07/30/22   Sexual activity: Yes    Birth control/protection: Post-menopausal  Other Topics Concern   Not on file  Social History Narrative   Not on file   Social Drivers of Health   Financial Resource Strain: High Risk (08/04/2022)   Overall Financial Resource Strain (CARDIA)    Difficulty of Paying Living Expenses: Hard  Food Insecurity: No Food Insecurity (07/27/2022)   Hunger Vital Sign    Worried About Running Out of Food in the Last Year: Never true    Ran Out of Food in the Last Year: Never true  Transportation Needs: No Transportation Needs (07/27/2022)   PRAPARE - Administrator, Civil Service (Medical):  No    Lack of Transportation (Non-Medical): No  Physical Activity: Not on file  Stress: Not on file  Social Connections: Not on file  Intimate Partner Violence: Not At Risk (07/27/2022)   Humiliation, Afraid, Rape, and Kick questionnaire    Fear of Current or Ex-Partner: No    Emotionally Abused: No    Physically Abused: No    Sexually Abused: No    Outpatient Medications Prior to Visit  Medication Sig Dispense Refill   acetaminophen (TYLENOL) 500 MG tablet Take 1,000 mg by mouth every 6 (six) hours as needed for moderate pain.     ALPRAZolam (XANAX) 1 MG tablet Take 1 mg by mouth 4 (four) times daily as needed for anxiety.      fexofenadine (ALLEGRA) 180 MG tablet Take 180 mg by mouth daily as needed for allergies or rhinitis.     imipramine (TOFRANIL) 50 MG tablet Take 50 mg by mouth at bedtime.      Multiple Vitamin (MULTIVITAMIN) tablet Take 1 tablet by mouth daily.     mupirocin cream (BACTROBAN) 2 % Apply 1 Application topically 2 (two) times daily. 30 g 0   polyethylene glycol (MIRALAX / GLYCOLAX) 17 g packet Take 17 g by mouth daily as needed for moderate constipation.     Probiotic Product (PROBIOTIC PO) Take 1 capsule by mouth daily.     triamcinolone cream (KENALOG) 0.1 % Apply 1 Application topically 2 (two) times daily. 30 g 0   Vitamin D, Ergocalciferol, (DRISDOL) 1.25 MG (50000 UNIT) CAPS capsule Take 1 capsule (50,000 Units total) by mouth every 7 (seven) days. 20 capsule 1   amLODipine (NORVASC) 10 MG tablet TAKE 1 TABLET(10 MG) BY MOUTH DAILY 90 tablet 0   metoprolol tartrate (LOPRESSOR) 25 MG tablet TAKE 1 TABLET(25 MG) BY MOUTH TWICE DAILY 180 tablet 0   nitrofurantoin, macrocrystal-monohydrate, (MACROBID) 100 MG capsule Take 1 capsule (100 mg total) by mouth 2 (two) times daily. 14 capsule 0   rOPINIRole (REQUIP) 0.5 MG tablet TAKE 1 TABLET(0.5 MG) BY MOUTH AT BEDTIME 90 tablet 2   simvastatin (ZOCOR) 20 MG tablet TAKE 1 TABLET(20 MG) BY MOUTH DAILY IN THE AFTERNOON 90 tablet 1   potassium chloride SA (KLOR-CON M) 20 MEQ tablet Take 2 tablets (40 mEq total) by mouth daily for 4 days. 8 tablet 0   No facility-administered medications prior to visit.    Allergies  Allergen Reactions   Doxycycline Nausea Only    Respiratory Distress, nausea, loss of appetite   Penicillins Hives and Nausea Only   Ciprofloxacin Other (See Comments)    Dry mouth, skin tingling, panicky, stomach pain, anxious and nervous    Estrace [Estradiol]     Vaginal discharge    Mirtazapine     Made BP drop and felt faint    Nitrofurantoin Other (See Comments)    Chills and hot flashes    Polymox [Amoxicillin] Hives   Progesterone     Burning / vaginal discharge     ROS Review of Systems  Constitutional:  Negative for chills and fever.  Eyes:  Negative for visual disturbance.   Respiratory:  Negative for chest tightness and shortness of breath.   Neurological:  Negative for dizziness and headaches.      Objective:    Physical Exam HENT:     Head: Normocephalic.     Mouth/Throat:     Mouth: Mucous membranes are moist.  Cardiovascular:     Rate and Rhythm: Normal rate.  Heart sounds: Normal heart sounds.  Pulmonary:     Effort: Pulmonary effort is normal.     Breath sounds: Normal breath sounds.  Neurological:     Mental Status: She is alert.     BP 106/71   Pulse 78   Ht 5\' 8"  (1.727 m)   Wt 140 lb 0.6 oz (63.5 kg)   SpO2 94%   BMI 21.29 kg/m  Wt Readings from Last 3 Encounters:  10/01/23 140 lb 0.6 oz (63.5 kg)  05/28/23 134 lb 1.9 oz (60.8 kg)  05/10/23 135 lb 6.4 oz (61.4 kg)    Lab Results  Component Value Date   TSH 1.160 05/28/2023   Lab Results  Component Value Date   WBC 11.1 (H) 05/28/2023   HGB 14.5 05/28/2023   HCT 46.3 05/28/2023   MCV 89 05/28/2023   PLT 408 05/28/2023   Lab Results  Component Value Date   NA 138 05/28/2023   K 4.5 05/28/2023   CO2 24 05/28/2023   GLUCOSE 95 05/28/2023   BUN 11 05/28/2023   CREATININE 1.19 (H) 05/28/2023   BILITOT 0.3 05/28/2023   ALKPHOS 118 05/28/2023   AST 26 05/28/2023   ALT 18 05/28/2023   PROT 7.5 05/28/2023   ALBUMIN 4.3 05/28/2023   CALCIUM 9.5 05/28/2023   ANIONGAP 3 (L) 08/23/2022   EGFR 52 (L) 05/28/2023   Lab Results  Component Value Date   CHOL 147 05/28/2023   Lab Results  Component Value Date   HDL 45 05/28/2023   Lab Results  Component Value Date   LDLCALC 84 05/28/2023   Lab Results  Component Value Date   TRIG 98 05/28/2023   Lab Results  Component Value Date   CHOLHDL 3.3 05/28/2023   Lab Results  Component Value Date   HGBA1C 5.2 05/28/2023      Assessment & Plan:  Essential hypertension Assessment & Plan: The patient's condition is currently controlled and asymptomatic She is taking Amlodipine 10 mg daily and Metoprolol 25  mg twice daily She reports compliance with current treatment regimen Low-sodium diet with increase physical activity encouraged BP Readings from Last 3 Encounters:  10/01/23 106/71  06/14/23 103/69  05/28/23 93/63       Orders: -     amLODIPine Besylate; TAKE 1 TABLET(10 MG) BY MOUTH DAILY  Dispense: 90 tablet; Refill: 0 -     Metoprolol Tartrate; Take 1 tablet (25 mg total) by mouth 2 (two) times daily.  Dispense: 180 tablet; Refill: 2  Benign essential tremor Assessment & Plan: The patient reports that her condition is currently stable and will defer pharmacological therapy therapy with gabapentin 100 mg nightly today The patient is encouraged to follow-up if her symptoms worsens or interfere with her ADLs    Other hyperlipidemia Assessment & Plan: The patient is currently taking Simvastatin 20 mg daily and reports no muscle aches or pain. She is encouraged to reduce her intake of saturated fats, trans fats, and cholesterol, while increasing her consumption of fruits, vegetables, whole grains, and omega-3 fatty acids, along with engaging in increased physical activity. Lab Results  Component Value Date   CHOL 147 05/28/2023   HDL 45 05/28/2023   LDLCALC 84 05/28/2023   TRIG 98 05/28/2023   CHOLHDL 3.3 05/28/2023       Orders: -     Simvastatin; TAKE 1 TABLET(20 MG) BY MOUTH DAILY IN THE AFTERNOON  Dispense: 90 tablet; Refill: 1 -     Lipid panel -  CMP14+EGFR -     CBC with Differential/Platelet  Restless legs syndrome Assessment & Plan: Refill sent to the pharmacy  Orders: -     rOPINIRole HCl; Take 1 tablet (0.5 mg total) by mouth at bedtime.  Dispense: 90 tablet; Refill: 2  IFG (impaired fasting glucose) -     Hemoglobin A1c  Vitamin D deficiency -     VITAMIN D 25 Hydroxy (Vit-D Deficiency, Fractures)  TSH (thyroid-stimulating hormone deficiency) -     TSH + free T4  Note: This chart has been completed using Engineer, civil (consulting) software, and  while attempts have been made to ensure accuracy, certain words and phrases may not be transcribed as intended.    Follow-up: Return in about 4 months (around 01/30/2024).   Gilmore Laroche, FNP

## 2023-10-02 LAB — CMP14+EGFR
ALT: 20 [IU]/L (ref 0–32)
AST: 25 [IU]/L (ref 0–40)
Albumin: 4.2 g/dL (ref 3.9–4.9)
Alkaline Phosphatase: 116 [IU]/L (ref 44–121)
BUN/Creatinine Ratio: 8 — ABNORMAL LOW (ref 12–28)
BUN: 9 mg/dL (ref 8–27)
Bilirubin Total: 0.3 mg/dL (ref 0.0–1.2)
CO2: 25 mmol/L (ref 20–29)
Calcium: 9.8 mg/dL (ref 8.7–10.3)
Chloride: 100 mmol/L (ref 96–106)
Creatinine, Ser: 1.11 mg/dL — ABNORMAL HIGH (ref 0.57–1.00)
Globulin, Total: 2.8 g/dL (ref 1.5–4.5)
Glucose: 95 mg/dL (ref 70–99)
Potassium: 4.5 mmol/L (ref 3.5–5.2)
Sodium: 136 mmol/L (ref 134–144)
Total Protein: 7 g/dL (ref 6.0–8.5)
eGFR: 57 mL/min/{1.73_m2} — ABNORMAL LOW (ref >59)

## 2023-10-02 LAB — CBC WITH DIFFERENTIAL/PLATELET
Basophils Absolute: 0.1 10*3/uL (ref 0.0–0.2)
Basos: 1 %
EOS (ABSOLUTE): 0.2 10*3/uL (ref 0.0–0.4)
Eos: 1 %
Hematocrit: 44.3 % (ref 34.0–46.6)
Hemoglobin: 14.1 g/dL (ref 11.1–15.9)
Immature Grans (Abs): 0 10*3/uL (ref 0.0–0.1)
Immature Granulocytes: 0 %
Lymphocytes Absolute: 2 10*3/uL (ref 0.7–3.1)
Lymphs: 17 %
MCH: 28 pg (ref 26.6–33.0)
MCHC: 31.8 g/dL (ref 31.5–35.7)
MCV: 88 fL (ref 79–97)
Monocytes Absolute: 0.8 10*3/uL (ref 0.1–0.9)
Monocytes: 7 %
Neutrophils Absolute: 8.7 10*3/uL — ABNORMAL HIGH (ref 1.4–7.0)
Neutrophils: 74 %
Platelets: 395 10*3/uL (ref 150–450)
RBC: 5.03 x10E6/uL (ref 3.77–5.28)
RDW: 12.6 % (ref 11.7–15.4)
WBC: 11.9 10*3/uL — ABNORMAL HIGH (ref 3.4–10.8)

## 2023-10-02 LAB — TSH+FREE T4
Free T4: 1.4 ng/dL (ref 0.82–1.77)
TSH: 1.27 u[IU]/mL (ref 0.450–4.500)

## 2023-10-02 LAB — VITAMIN D 25 HYDROXY (VIT D DEFICIENCY, FRACTURES): Vit D, 25-Hydroxy: 75.8 ng/mL (ref 30.0–100.0)

## 2023-10-02 LAB — LIPID PANEL
Chol/HDL Ratio: 3.3 {ratio} (ref 0.0–4.4)
Cholesterol, Total: 139 mg/dL (ref 100–199)
HDL: 42 mg/dL (ref >39)
LDL Chol Calc (NIH): 76 mg/dL (ref 0–99)
Triglycerides: 114 mg/dL (ref 0–149)
VLDL Cholesterol Cal: 21 mg/dL (ref 5–40)

## 2023-10-02 LAB — HEMOGLOBIN A1C
Est. average glucose Bld gHb Est-mCnc: 108 mg/dL
Hgb A1c MFr Bld: 5.4 % (ref 4.8–5.6)

## 2023-10-02 NOTE — Progress Notes (Signed)
Please inform the patient her labs are stable

## 2023-10-16 ENCOUNTER — Other Ambulatory Visit: Payer: Self-pay | Admitting: Adult Health

## 2023-10-16 MED ORDER — SULFAMETHOXAZOLE-TRIMETHOPRIM 800-160 MG PO TABS: 1.0000 | ORAL_TABLET | Freq: Two times a day (BID) | ORAL | 0 refills | Status: DC

## 2023-10-16 NOTE — Progress Notes (Signed)
 Rx septra ds

## 2023-11-05 ENCOUNTER — Other Ambulatory Visit: Payer: Self-pay | Admitting: Family Medicine

## 2023-11-05 DIAGNOSIS — I10 Essential (primary) hypertension: Secondary | ICD-10-CM

## 2023-11-16 ENCOUNTER — Encounter: Payer: Self-pay | Admitting: Family Medicine

## 2023-11-19 ENCOUNTER — Other Ambulatory Visit: Payer: Self-pay | Admitting: Adult Health

## 2023-11-19 ENCOUNTER — Other Ambulatory Visit (INDEPENDENT_AMBULATORY_CARE_PROVIDER_SITE_OTHER): Payer: Medicaid Other | Admitting: *Deleted

## 2023-11-19 DIAGNOSIS — R300 Dysuria: Secondary | ICD-10-CM

## 2023-11-19 DIAGNOSIS — R350 Frequency of micturition: Secondary | ICD-10-CM

## 2023-11-19 LAB — POCT URINALYSIS DIPSTICK
Glucose, UA: NEGATIVE
Ketones, UA: NEGATIVE
Nitrite, UA: NEGATIVE
Protein, UA: NEGATIVE

## 2023-11-19 MED ORDER — SULFAMETHOXAZOLE-TRIMETHOPRIM 800-160 MG PO TABS: 1.0000 | ORAL_TABLET | Freq: Two times a day (BID) | ORAL | 0 refills | Status: DC

## 2023-11-19 NOTE — Progress Notes (Signed)
Rx sent for septra ds

## 2023-11-19 NOTE — Progress Notes (Signed)
   NURSE VISIT- UTI SYMPTOMS   SUBJECTIVE:  Leah Padilla is a 63 y.o. G17P2002 female here for UTI symptoms. She is a GYN patient. She reports dysuria and urinary frequency.  OBJECTIVE:  There were no vitals taken for this visit.  Appears well, in no apparent distress  No results found for this or any previous visit (from the past 24 hours).  ASSESSMENT: GYN patient with UTI symptoms and negative nitrites  PLAN: Note routed to Cyril Mourning, AGNP   Rx sent by provider today: Yes Urine culture sent Call or return to clinic prn if these symptoms worsen or fail to improve as anticipated. Follow-up: as needed  Pt requested rx be sent in due to her being in pain and possible snow later this week. Aware to check at pharmacy.    Annamarie Dawley  11/19/2023 10:08 AM

## 2023-11-20 LAB — URINALYSIS
Bilirubin, UA: NEGATIVE
Glucose, UA: NEGATIVE
Ketones, UA: NEGATIVE
Nitrite, UA: NEGATIVE
Protein,UA: NEGATIVE
Specific Gravity, UA: 1.015 (ref 1.005–1.030)
Urobilinogen, Ur: 0.2 mg/dL (ref 0.2–1.0)
pH, UA: 6.5 (ref 5.0–7.5)

## 2023-11-21 LAB — URINE CULTURE

## 2023-11-28 DIAGNOSIS — F411 Generalized anxiety disorder: Secondary | ICD-10-CM | POA: Diagnosis not present

## 2023-12-14 DIAGNOSIS — F411 Generalized anxiety disorder: Secondary | ICD-10-CM | POA: Diagnosis not present

## 2023-12-18 DIAGNOSIS — F411 Generalized anxiety disorder: Secondary | ICD-10-CM | POA: Diagnosis not present

## 2023-12-19 DIAGNOSIS — D869 Sarcoidosis, unspecified: Secondary | ICD-10-CM | POA: Diagnosis not present

## 2024-01-04 DIAGNOSIS — F411 Generalized anxiety disorder: Secondary | ICD-10-CM | POA: Diagnosis not present

## 2024-01-10 DIAGNOSIS — F411 Generalized anxiety disorder: Secondary | ICD-10-CM | POA: Diagnosis not present

## 2024-01-18 DIAGNOSIS — F411 Generalized anxiety disorder: Secondary | ICD-10-CM | POA: Diagnosis not present

## 2024-01-25 DIAGNOSIS — F411 Generalized anxiety disorder: Secondary | ICD-10-CM | POA: Diagnosis not present

## 2024-02-01 ENCOUNTER — Ambulatory Visit: Payer: Medicaid Other | Admitting: Family Medicine

## 2024-02-01 DIAGNOSIS — F411 Generalized anxiety disorder: Secondary | ICD-10-CM | POA: Diagnosis not present

## 2024-02-15 DIAGNOSIS — F411 Generalized anxiety disorder: Secondary | ICD-10-CM | POA: Diagnosis not present

## 2024-02-18 ENCOUNTER — Ambulatory Visit

## 2024-02-18 DIAGNOSIS — R8290 Unspecified abnormal findings in urine: Secondary | ICD-10-CM

## 2024-02-18 DIAGNOSIS — R300 Dysuria: Secondary | ICD-10-CM

## 2024-02-18 DIAGNOSIS — R350 Frequency of micturition: Secondary | ICD-10-CM

## 2024-02-18 LAB — POCT URINALYSIS DIPSTICK OB
Blood, UA: NEGATIVE
Glucose, UA: NEGATIVE
Ketones, UA: NEGATIVE
Nitrite, UA: NEGATIVE
POC,PROTEIN,UA: NEGATIVE

## 2024-02-18 NOTE — Progress Notes (Signed)
   NURSE VISIT- UTI SYMPTOMS   SUBJECTIVE:  Leah Padilla is a 63 y.o. G37P2002 female here for UTI symptoms. She is a GYN patient. She reports dysuria, urinary frequency, and Odor.  OBJECTIVE:  There were no vitals taken for this visit.  Appears well, in no apparent distress  Results for orders placed or performed in visit on 02/18/24 (from the past 24 hours)  POC Urinalysis Dipstick OB   Collection Time: 02/18/24  4:11 PM  Result Value Ref Range   Color, UA     Clarity, UA     Glucose, UA Negative Negative   Bilirubin, UA     Ketones, UA neg    Spec Grav, UA     Blood, UA neg    pH, UA     POC,PROTEIN,UA Negative Negative, Trace, Small (1+), Moderate (2+), Large (3+), 4+   Urobilinogen, UA     Nitrite, UA neg    Leukocytes, UA Large (3+) (A) Negative   Appearance     Odor      ASSESSMENT: GYN patient with UTI symptoms and negative nitrites  PLAN: Note routed to Lendia Quay, AGNP   Rx sent by provider today: No Urine culture sent Call or return to clinic prn if these symptoms worsen or fail to improve as anticipated. Follow-up: as needed   Bryla Burek E Temeka Pore  02/18/2024 4:12 PM

## 2024-02-19 LAB — MICROSCOPIC EXAMINATION
Casts: NONE SEEN /LPF
Epithelial Cells (non renal): NONE SEEN /HPF (ref 0–10)
WBC, UA: >30 /HPF — AB (ref 0–5)

## 2024-02-19 LAB — URINALYSIS, ROUTINE W REFLEX MICROSCOPIC
Bilirubin, UA: NEGATIVE
Glucose, UA: NEGATIVE
Ketones, UA: NEGATIVE
Nitrite, UA: NEGATIVE
Specific Gravity, UA: 1.011 (ref 1.005–1.030)
Urobilinogen, Ur: 0.2 mg/dL (ref 0.2–1.0)
pH, UA: 7.5 (ref 5.0–7.5)

## 2024-02-22 ENCOUNTER — Telehealth: Payer: Self-pay

## 2024-02-22 DIAGNOSIS — F411 Generalized anxiety disorder: Secondary | ICD-10-CM | POA: Diagnosis not present

## 2024-02-22 DIAGNOSIS — N3000 Acute cystitis without hematuria: Secondary | ICD-10-CM

## 2024-02-22 LAB — URINE CULTURE

## 2024-02-22 MED ORDER — SULFAMETHOXAZOLE-TRIMETHOPRIM 800-160 MG PO TABS: 1.0000 | ORAL_TABLET | Freq: Two times a day (BID) | ORAL | 0 refills | Status: DC

## 2024-02-22 MED ORDER — PHENAZOPYRIDINE HCL 200 MG PO TABS: 200.0000 mg | ORAL_TABLET | Freq: Three times a day (TID) | ORAL | 1 refills | Status: DC | PRN

## 2024-02-22 NOTE — Telephone Encounter (Signed)
 Patient would like for a nurse to call her concerning her results.

## 2024-02-26 ENCOUNTER — Ambulatory Visit: Payer: Self-pay | Admitting: Adult Health

## 2024-02-29 DIAGNOSIS — F411 Generalized anxiety disorder: Secondary | ICD-10-CM | POA: Diagnosis not present

## 2024-03-03 ENCOUNTER — Telehealth: Payer: Self-pay

## 2024-03-03 NOTE — Telephone Encounter (Signed)
 Patient wants medication for a UTI.

## 2024-03-04 ENCOUNTER — Ambulatory Visit

## 2024-03-04 DIAGNOSIS — R300 Dysuria: Secondary | ICD-10-CM | POA: Diagnosis not present

## 2024-03-04 LAB — POCT URINALYSIS DIPSTICK OB
Blood, UA: NEGATIVE
Glucose, UA: NEGATIVE
Ketones, UA: NEGATIVE
Leukocytes, UA: NEGATIVE
Nitrite, UA: NEGATIVE
POC,PROTEIN,UA: NEGATIVE

## 2024-03-04 NOTE — Progress Notes (Signed)
   NURSE VISIT- UTI SYMPTOMS   SUBJECTIVE:  Leah Padilla is a 63 y.o. G91P2002 female here for UTI symptoms. She is a GYN patient. She reports dysuria.  OBJECTIVE:  There were no vitals taken for this visit.  Appears well, in no apparent distress  Results for orders placed or performed in visit on 03/04/24 (from the past 24 hours)  POC Urinalysis Dipstick OB   Collection Time: 03/04/24 11:36 AM  Result Value Ref Range   Color, UA     Clarity, UA     Glucose, UA Negative Negative   Bilirubin, UA     Ketones, UA neg    Spec Grav, UA     Blood, UA neg    pH, UA     POC,PROTEIN,UA Negative Negative, Trace, Small (1+), Moderate (2+), Large (3+), 4+   Urobilinogen, UA     Nitrite, UA neg    Leukocytes, UA Negative Negative   Appearance     Odor      ASSESSMENT: GYN patient with UTI symptoms and negative nitrites  PLAN: Note routed to Lendia Quay, AGNP   Rx sent by provider today: No Urine culture sent Call or return to clinic prn if these symptoms worsen or fail to improve as anticipated. Follow-up: as needed   Alyssa Jumper  03/04/2024 11:37 AM

## 2024-03-05 LAB — URINALYSIS, ROUTINE W REFLEX MICROSCOPIC
Bilirubin, UA: NEGATIVE
Glucose, UA: NEGATIVE
Ketones, UA: NEGATIVE
Nitrite, UA: POSITIVE — AB
Protein,UA: NEGATIVE
RBC, UA: NEGATIVE
Specific Gravity, UA: 1.007 (ref 1.005–1.030)
Urobilinogen, Ur: 0.2 mg/dL (ref 0.2–1.0)
pH, UA: 6.5 (ref 5.0–7.5)

## 2024-03-05 LAB — MICROSCOPIC EXAMINATION
Bacteria, UA: NONE SEEN
Casts: NONE SEEN /LPF
RBC, Urine: NONE SEEN /HPF (ref 0–2)

## 2024-03-06 ENCOUNTER — Ambulatory Visit: Payer: Self-pay | Admitting: Adult Health

## 2024-03-06 LAB — URINE CULTURE

## 2024-03-11 ENCOUNTER — Telehealth: Payer: Self-pay | Admitting: Adult Health

## 2024-03-11 NOTE — Telephone Encounter (Signed)
 Pt aware that urine culture showed no dominant growth so no need for antibiotic. Pt voiced understanding. JSY

## 2024-03-11 NOTE — Telephone Encounter (Signed)
 Pt states she would like a call with her lab results.

## 2024-03-14 DIAGNOSIS — F411 Generalized anxiety disorder: Secondary | ICD-10-CM | POA: Diagnosis not present

## 2024-03-17 LAB — CYTOLOGY - PAP

## 2024-03-20 ENCOUNTER — Ambulatory Visit (INDEPENDENT_AMBULATORY_CARE_PROVIDER_SITE_OTHER): Admitting: Family Medicine

## 2024-03-20 ENCOUNTER — Encounter: Payer: Self-pay | Admitting: Family Medicine

## 2024-03-20 VITALS — BP 105/69 | HR 92 | Resp 16 | Ht 68.0 in | Wt 144.0 lb

## 2024-03-20 DIAGNOSIS — N390 Urinary tract infection, site not specified: Secondary | ICD-10-CM

## 2024-03-20 DIAGNOSIS — R300 Dysuria: Secondary | ICD-10-CM

## 2024-03-20 DIAGNOSIS — N3000 Acute cystitis without hematuria: Secondary | ICD-10-CM

## 2024-03-20 MED ORDER — SULFAMETHOXAZOLE-TRIMETHOPRIM 800-160 MG PO TABS: 1.0000 | ORAL_TABLET | Freq: Two times a day (BID) | ORAL | 0 refills | Status: DC

## 2024-03-20 NOTE — Progress Notes (Signed)
 Established Patient Office Visit  Subjective:  Patient ID: Leah Padilla, female    DOB: [birth year]  Age: 63 y.o. MRN: [id number]  CC:  Chief Complaint  Patient presents with   Urinary Tract Infection    X 1 week, dysuria and frequency     HPI Leah Padilla is a 63 y.o. female   presents with the above complaints. No fever reported. The patient reports frequent UTI.   Past Medical History:  Diagnosis Date   Anxiety    Bell's palsy    Depression    High cholesterol    Hypertension     Past Surgical History:  Procedure Laterality Date   AXILLARY LYMPH NODE BIOPSY Right 01/17/2022   Procedure: AXILLARY LYMPH NODE BIOPSY;  Surgeon: Evonnie Dorothyann LABOR, DO;  Location: [facility];  Service: General;  Laterality: Right;   BIOPSY  08/01/2022   Procedure: BIOPSY;  Surgeon: Cindie Carlin POUR, DO;  Location: [facility];  Service: Endoscopy;;   COLONOSCOPY N/A 04/17/2013   Procedure: COLONOSCOPY;  Surgeon: Claudis RAYMOND Rivet, MD;  Location: [facility];  Service: Endoscopy;  Laterality: N/A;  930   COLONOSCOPY WITH PROPOFOL  N/A 08/01/2022   Procedure: COLONOSCOPY WITH PROPOFOL ;  Surgeon: Cindie Carlin POUR, DO;  Location: [facility];  Service: Endoscopy;  Laterality: N/A;  1:30 pm   CYST EXCISION     neck    Family History  Problem Relation Age of Onset   Multiple myeloma Mother    Stroke Father    Breast cancer Sister    Stroke Other    Diabetes Other    Colon cancer Neg Hx     Social History   Socioeconomic History   Marital status: Divorced    Spouse name: Not on file   Number of children: 2   Years of education: Not on file   Highest education level: Not on file  Occupational History   Not on file  Tobacco Use   Smoking status: Former    Current packs/day: 0.00    Average packs/day: 0.5 packs/day for 13.0 years (6.5 ttl pk-yrs)    Types: Cigarettes    Start date: 12/14/2003    Quit date: 12/13/2016    Years since quitting: 7.2   Smokeless tobacco: Never   Vaping Use   Vaping status: Former  Substance and Sexual Activity   Alcohol use: Not Currently    Comment: occasional   Drug use: Yes    Frequency: 3.0 times per week    Types: Marijuana    Comment: last used 07/30/22   Sexual activity: Yes    Birth control/protection: Post-menopausal  Other Topics Concern   Not on file  Social History Narrative   Not on file   Social Drivers of Health   Financial Resource Strain: High Risk (08/04/2022)   Overall Financial Resource Strain (CARDIA)    Difficulty of Paying Living Expenses: Hard  Food Insecurity: No Food Insecurity (07/27/2022)   Hunger Vital Sign    Worried About Running Out of Food in the Last Year: Never true    Ran Out of Food in the Last Year: Never true  Transportation Needs: No Transportation Needs (07/27/2022)   PRAPARE - Administrator, Civil Service (Medical): No    Lack of Transportation (Non-Medical): No  Physical Activity: Not on file  Stress: Not on file  Social Connections: Not on file  Intimate Partner Violence: Not At Risk (07/27/2022)   Humiliation, Afraid, Rape, and Kick  questionnaire    Fear of Current or Ex-Partner: No    Emotionally Abused: No    Physically Abused: No    Sexually Abused: No    Outpatient Medications Prior to Visit  Medication Sig Dispense Refill   acetaminophen  (TYLENOL ) 500 MG tablet Take 1,000 mg by mouth every 6 (six) hours as needed for moderate pain.     ALPRAZolam  (XANAX ) 1 MG tablet Take 1 mg by mouth 4 (four) times daily as needed for anxiety.      amLODipine  (NORVASC ) 10 MG tablet TAKE 1 TABLET(10 MG) BY MOUTH DAILY 90 tablet 0   fexofenadine (ALLEGRA) 180 MG tablet Take 180 mg by mouth daily as needed for allergies or rhinitis.     imipramine  (TOFRANIL ) 50 MG tablet Take 50 mg by mouth at bedtime.     metoprolol  tartrate (LOPRESSOR ) 25 MG tablet Take 1 tablet (25 mg total) by mouth 2 (two) times daily. 180 tablet 2   Multiple Vitamin (MULTIVITAMIN) tablet Take 1  tablet by mouth daily.     mupirocin  cream (BACTROBAN ) 2 % Apply 1 Application topically 2 (two) times daily. 30 g 0   phenazopyridine  (PYRIDIUM ) 200 MG tablet Take 1 tablet (200 mg total) by mouth 3 (three) times daily as needed for pain (urethral spasm). 10 tablet 1   polyethylene glycol (MIRALAX  / GLYCOLAX ) 17 g packet Take 17 g by mouth daily as needed for moderate constipation.     Probiotic Product (PROBIOTIC PO) Take 1 capsule by mouth daily.     rOPINIRole  (REQUIP ) 0.5 MG tablet Take 1 tablet (0.5 mg total) by mouth at bedtime. 90 tablet 2   simvastatin  (ZOCOR ) 20 MG tablet TAKE 1 TABLET(20 MG) BY MOUTH DAILY IN THE AFTERNOON 90 tablet 1   triamcinolone  cream (KENALOG ) 0.1 % Apply 1 Application topically 2 (two) times daily. 30 g 0   Vitamin D , Ergocalciferol , (DRISDOL ) 1.25 MG (50000 UNIT) CAPS capsule Take 1 capsule (50,000 Units total) by mouth every 7 (seven) days. 20 capsule 1   sulfamethoxazole -trimethoprim  (BACTRIM  DS) 800-160 MG tablet Take 1 tablet by mouth 2 (two) times daily. Take 1 bid 14 tablet 0   No facility-administered medications prior to visit.    Allergies  Allergen Reactions   Doxycycline Nausea Only    Respiratory Distress, nausea, loss of appetite   Penicillins Hives and Nausea Only   Ciprofloxacin  Other (See Comments)    Dry mouth, skin tingling, panicky, stomach pain, anxious and nervous    Estrace  [Estradiol ]     Vaginal discharge    Mirtazapine     Made BP drop and felt faint    Nitrofurantoin  Other (See Comments)    Chills and hot flashes    Polymox [Amoxicillin] Hives   Progesterone      Burning / vaginal discharge     ROS Review of Systems  Constitutional:  Negative for chills and fever.  Eyes:  Negative for visual disturbance.  Respiratory:  Negative for chest tightness and shortness of breath.   Genitourinary:  Positive for dysuria and frequency.  Neurological:  Negative for dizziness and headaches.      Objective:    Physical  Exam HENT:     Head: Normocephalic.     Mouth/Throat:     Mouth: Mucous membranes are moist.   Cardiovascular:     Rate and Rhythm: Normal rate.     Heart sounds: Normal heart sounds.  Pulmonary:     Effort: Pulmonary effort is normal.  Breath sounds: Normal breath sounds.  Abdominal:     Tenderness: There is no abdominal tenderness.   Neurological:     Mental Status: She is alert.     BP 105/69   Pulse 92   Resp 16   Ht 5' 8 (1.727 m)   Wt 144 lb (65.3 kg)   SpO2 95%   BMI 21.90 kg/m  Wt Readings from Last 3 Encounters:  03/20/24 144 lb (65.3 kg)  10/01/23 140 lb 0.6 oz (63.5 kg)  05/28/23 134 lb 1.9 oz (60.8 kg)    Lab Results  Component Value Date   TSH 1.270 10/01/2023   Lab Results  Component Value Date   WBC 11.9 (H) 10/01/2023   HGB 14.1 10/01/2023   HCT 44.3 10/01/2023   MCV 88 10/01/2023   PLT 395 10/01/2023   Lab Results  Component Value Date   NA 136 10/01/2023   K 4.5 10/01/2023   CO2 25 10/01/2023   GLUCOSE 95 10/01/2023   BUN 9 10/01/2023   CREATININE 1.11 (H) 10/01/2023   BILITOT 0.3 10/01/2023   ALKPHOS 116 10/01/2023   AST 25 10/01/2023   ALT 20 10/01/2023   PROT 7.0 10/01/2023   ALBUMIN 4.2 10/01/2023   CALCIUM  9.8 10/01/2023   ANIONGAP 3 (L) 08/23/2022   EGFR 57 (L) 10/01/2023   Lab Results  Component Value Date   CHOL 139 10/01/2023   Lab Results  Component Value Date   HDL 42 10/01/2023   Lab Results  Component Value Date   LDLCALC 76 10/01/2023   Lab Results  Component Value Date   TRIG 114 10/01/2023   Lab Results  Component Value Date   CHOLHDL 3.3 10/01/2023   Lab Results  Component Value Date   HGBA1C 5.4 10/01/2023      Assessment & Plan:  Recurrent UTI Assessment & Plan: UA positive or leukocytes  Initiate therapy with Bactrim  (sulfamethoxazole /trimethoprim ) 1 tablet twice daily for 5 days. Emphasize the importance of completing the full course of antibiotics as prescribed. Preventative  Measures Discussed for UTIs:  Avoid Prolonged Urination: Do not hold urine for extended periods, as this can stretch the bladder and create an environment conducive to bacterial growth. Prompt Bladder Emptying: Empty the bladder as soon as you feel the urge to prevent bacterial buildup. Post-Intercourse Hygiene: Empty the bladder soon after intercourse to reduce the risk of infection. Shower Instead of Bath: Opt for showers rather than baths to minimize bacterial exposure. Proper Wiping Technique: Always wipe from front to back after urinating or bowel movements to prevent the transfer of bacteria from the anal region to the urethra or vagina. Hydration: Drink plenty of water  regularly to help flush bacteria from the urinary system. The patient verbalized understanding of the treatment plan and preventative measures. Follow-up will be scheduled based on the culture results or if symptoms persist or worsen.   Orders: -     Urinalysis -     Sulfamethoxazole -Trimethoprim ; Take 1 tablet by mouth 2 (two) times daily. Take 1 bid  Dispense: 14 tablet; Refill: 0 -     Ambulatory referral to Urology   Note: This chart has been completed using Engineer, civil (consulting) software, and while attempts have been made to ensure accuracy, certain words and phrases may not be transcribed as intended.   Follow-up: Return in about 3 months (around 06/20/2024).   Knut Rondinelli, FNP

## 2024-03-20 NOTE — Patient Instructions (Addendum)
 I appreciate the opportunity to provide care to you today!    Please ensure you complete the full course of the antibiotics as prescribed.  To help prevent future urinary tract infections (UTIs), consider the following practices:  Avoid Prolonged Urination: Do not hold urine for extended periods, as this can stretch the bladder and create an environment conducive to bacterial growth. Prompt Bladder Emptying: Empty your bladder as soon as you feel the urge. Post-Intercourse Hygiene: Empty your bladder soon after intercourse to reduce the risk of infection. Shower Instead of Bath: Opt for showers rather than baths to minimize bacterial exposure. Proper Wiping Technique: Wipe from front to back after urinating and bowel movements to prevent the transfer of bacteria from the anal region to the vagina and urethra. Hydration: Drink a full glass of water  regularly to help flush bacteria from your system.   Referrals today-  urology    Please continue to a heart-healthy diet and increase your physical activities. Try to exercise for at least five days a week.    It was a pleasure to see you and I look forward to continuing to work together on your health and well-being. Please do not hesitate to call the office if you need care or have questions about your care.  In case of emergency, please visit the Emergency Department for urgent care, or contact our clinic at [phone] to schedule an appointment. We're here to help you!   Have a wonderful day and week. With Gratitude, Khaled Herda MSN, FNP-BC

## 2024-03-22 NOTE — Assessment & Plan Note (Addendum)
 UA positive or leukocytes  Initiate therapy with Bactrim  (sulfamethoxazole /trimethoprim ) 1 tablet twice daily for 5 days. Emphasize the importance of completing the full course of antibiotics as prescribed. Preventative Measures Discussed for UTIs:  Avoid Prolonged Urination: Do not hold urine for extended periods, as this can stretch the bladder and create an environment conducive to bacterial growth. Prompt Bladder Emptying: Empty the bladder as soon as you feel the urge to prevent bacterial buildup. Post-Intercourse Hygiene: Empty the bladder soon after intercourse to reduce the risk of infection. Shower Instead of Bath: Opt for showers rather than baths to minimize bacterial exposure. Proper Wiping Technique: Always wipe from front to back after urinating or bowel movements to prevent the transfer of bacteria from the anal region to the urethra or vagina. Hydration: Drink plenty of water  regularly to help flush bacteria from the urinary system. The patient verbalized understanding of the treatment plan and preventative measures. Follow-up will be scheduled based on the culture results or if symptoms persist or worsen.

## 2024-03-24 LAB — URINALYSIS
Bilirubin, UA: NEGATIVE
Glucose, UA: NEGATIVE
Ketones, UA: NEGATIVE
Nitrite, UA: NEGATIVE
Specific Gravity, UA: 1.02 (ref 1.005–1.030)
Urobilinogen, Ur: 0.2 mg/dL (ref 0.2–1.0)
pH, UA: 7 (ref 5.0–7.5)

## 2024-03-26 ENCOUNTER — Other Ambulatory Visit: Payer: Self-pay | Admitting: Family Medicine

## 2024-03-26 ENCOUNTER — Telehealth: Payer: Self-pay | Admitting: Family Medicine

## 2024-03-26 DIAGNOSIS — J320 Chronic maxillary sinusitis: Secondary | ICD-10-CM

## 2024-03-26 NOTE — Telephone Encounter (Unsigned)
 Copied from CRM [id number]. Topic: Clinical - Medication Refill >> Mar 26, 2024  1:32 PM Shardie S wrote: Medication: fluticasone  (FLONASE ) 50 MCG/ACT nasal spray  Has the patient contacted their pharmacy? No (Agent: If no, request that the patient contact the pharmacy for the refill. If patient does not wish to contact the pharmacy document the reason why and proceed with request.) (Agent: If yes, when and what did the pharmacy advise?)  This is the patient's preferred pharmacy:  [facility] [phone] - [place],  - [address] AT [facility] [address] [place] [address] Phone: [phone] Fax: [phone]  Is this the correct pharmacy for this prescription? Yes If no, delete pharmacy and type the correct one.   Has the prescription been filled recently? No  Is the patient out of the medication? Yes  Has the patient been seen for an appointment in the last year OR does the patient have an upcoming appointment? Yes  Can we respond through MyChart? No  Agent: Please be advised that Rx refills may take up to 3 business days. We ask that you follow-up with your pharmacy.

## 2024-03-28 DIAGNOSIS — Z01419 Encounter for gynecological examination (general) (routine) without abnormal findings: Secondary | ICD-10-CM | POA: Diagnosis not present

## 2024-03-28 DIAGNOSIS — Z124 Encounter for screening for malignant neoplasm of cervix: Secondary | ICD-10-CM | POA: Diagnosis not present

## 2024-03-28 DIAGNOSIS — Z1231 Encounter for screening mammogram for malignant neoplasm of breast: Secondary | ICD-10-CM | POA: Diagnosis not present

## 2024-03-31 ENCOUNTER — Other Ambulatory Visit: Payer: Self-pay | Admitting: Family Medicine

## 2024-03-31 DIAGNOSIS — J320 Chronic maxillary sinusitis: Secondary | ICD-10-CM

## 2024-03-31 MED ORDER — FLUTICASONE PROPIONATE 50 MCG/ACT NA SUSP: 2.0000 | Freq: Every day | NASAL | 1 refills | Status: DC

## 2024-04-11 DIAGNOSIS — F411 Generalized anxiety disorder: Secondary | ICD-10-CM | POA: Diagnosis not present

## 2024-04-14 ENCOUNTER — Ambulatory Visit: Payer: Self-pay

## 2024-04-14 NOTE — Telephone Encounter (Signed)
 FYI Only or Action Required?: Action required by provider: patient is requesting a follow up call from PCP-asking for another antibiotic to be sent to pharmacy.  Patient was last seen in primary care on 03/20/2024 by Zarwolo, Gloria, FNP.  Called Nurse Triage reporting Urinary Frequency.  Symptoms began symptoms started within a few days of finishing her previous antibiotic.  Interventions attempted: Rest, hydration, or home remedies.  Symptoms are: unchanged.  Triage Disposition: See Physician Within 24 Hours-needing a follow up phone call  Patient/caregiver understands and will follow disposition?: No, wishes to speak with PCP  Copied from CRM [id number]. Topic: Clinical - Red Word Triage >> Apr 14, 2024  9:30 AM Kevelyn M wrote: Red Word that prompted transfer to Nurse Triage: Patient finished antibiotic for a UTI but still have symptoms (frequent urination, burning while urinating) Reason for Disposition  Urinating more frequently than usual (i.e., frequency) OR new-onset of the feeling of an urgent need to urinate (i.e., urgency)  Answer Assessment - Initial Assessment Questions 1. SYMPTOM: What's the main symptom you're concerned about? (e.g., frequency, incontinence)     Urinary frequency, burning while urinating 2. ONSET: When did the  urinary frequency, burning while urinating  start?     Patient unsure of the exact date but patient reports she started antibiotic on 6/19-took the complete round. Reports symptoms started back a few days after finishing the round.  3. PAIN: Is there any pain? If Yes, ask: How bad is it? (Scale: 1-10; mild, moderate, severe)     Yes-Moderate- pain but pain makes patient feels sick 4. CAUSE: What do you think is causing the symptoms?     Hx of multiple UTIs 5. OTHER SYMPTOMS: Do you have any other symptoms? (e.g., blood in urine, fever, flank pain, pain with urination)     Pain with urination  Patient is asking for a phone call from  PCP. Patient is scheduled for an appointment with Urology at the end of July. Patient did call urology office but they are unable to see her sooner. Patient is wondering if another antibiotic can be sent in. Patient is asking for a follow up phone call.  Protocols used: Urinary Symptoms-A-AH

## 2024-04-16 ENCOUNTER — Telehealth: Admitting: Physician Assistant

## 2024-04-16 ENCOUNTER — Ambulatory Visit: Payer: Self-pay

## 2024-04-16 ENCOUNTER — Other Ambulatory Visit: Payer: Self-pay

## 2024-04-16 DIAGNOSIS — N390 Urinary tract infection, site not specified: Secondary | ICD-10-CM | POA: Diagnosis not present

## 2024-04-16 MED ORDER — CEPHALEXIN 500 MG PO CAPS: 500.0000 mg | ORAL_CAPSULE | Freq: Two times a day (BID) | ORAL | 0 refills | Status: AC

## 2024-04-16 NOTE — Progress Notes (Signed)
 Virtual Visit Consent   Leah Padilla, you are scheduled for a virtual visit with a [facility] provider today. Just as with appointments in the office, your consent must be obtained to participate. Your consent will be active for this visit and any virtual visit you may have with one of our providers in the next 365 days. If you have a MyChart account, a copy of this consent can be sent to you electronically.  As this is a virtual visit, video technology does not allow for your provider to perform a traditional examination. This may limit your provider's ability to fully assess your condition. If your provider identifies any concerns that need to be evaluated in person or the need to arrange testing (such as labs, EKG, etc.), we will make arrangements to do so. Although advances in technology are sophisticated, we cannot ensure that it will always work on either your end or our end. If the connection with a video visit is poor, the visit may have to be switched to a telephone visit. With either a video or telephone visit, we are not always able to ensure that we have a secure connection.  By engaging in this virtual visit, you consent to the provision of healthcare and authorize for your insurance to be billed (if applicable) for the services provided during this visit. Depending on your insurance coverage, you may receive a charge related to this service.  I need to obtain your verbal consent now. Are you willing to proceed with your visit today? Leah Padilla has provided verbal consent on 04/16/2024 for a virtual visit (video or telephone). Leah Padilla, [place]  Date: 04/16/2024 11:22 AM   Virtual Visit via Video Note   I, Leah Padilla, connected with  Leah Padilla  ([id number], [birth year]) on 04/16/24 at 11:15 AM EDT by a video-enabled telemedicine application and verified that I am speaking with the correct person using two identifiers.  Location: Patient: Virtual Visit Location  Patient: Home Provider: Virtual Visit Location Provider: Home Office   I discussed the limitations of evaluation and management by telemedicine and the availability of in person appointments. The patient expressed understanding and agreed to proceed.    History of Present Illness: Leah Padilla is a 63 y.o. who identifies as a female who was assigned female at birth, and is being seen today for possible UTI.  History of frequent UTI from a young age, worsening over the past few years. Saw PCP a month ago and diagnosed for a UTI via Urinalysis (no culture sent). Was given 5-day course of Bactrim  which she completed with resolution of symptoms. Referral to Urology was placed as well at time of that visit -- Urology appointment 7/30. Notes recurrence of symptoms over the past few days with urinary frequency, urgency, dysuria (milder) and fatigue. Notes some cloudy urine. Denies fever, chills, hematuria, nausea/vomiting. Denies flank pain. Called Urologist office but they could not see her any sooner.    HPI: HPI  Problems:  Patient Active Problem List   Diagnosis Date Noted   Benign essential tremor 10/01/2023   Stage 3b chronic kidney disease (CKD) (HCC)    Positive colorectal cancer screening using Cologuard test 07/25/2022   Acute cystitis with hematuria 06/29/2022   History of hypercholesterolemia 06/29/2022   Restless legs syndrome 06/29/2022   Rash and nonspecific skin eruption 06/13/2022   Chronic sinusitis of both maxillary sinuses 06/13/2022   Left-sided Bell's palsy 05/17/2022   Constipation 05/17/2022   Absolute anemia 02/14/2022  Hypophosphatemia 01/17/2022   Hypomagnesemia 01/17/2022   HLD (hyperlipidemia) 01/17/2022   History of Bell's palsy 01/17/2022   Anxiety 01/17/2022   Nausea and vomiting 01/17/2022   Lymphadenopathy, generalized    Hypercalcemia 01/13/2022   AKI (acute kidney injury) (HCC) 01/13/2022   Acute metabolic encephalopathy 01/13/2022   Hypokalemia  01/13/2022   Essential hypertension 01/13/2022   Recurrent UTI 08/03/2021   Burning with urination 08/03/2021   Vaginal atrophy 08/03/2021   History of UTI 02/25/2020   Urinary tract infection with hematuria 02/11/2020   Urinary frequency 02/11/2020   Vulvar irritation 07/23/2019   Vaginal discharge 07/23/2019   Allergic reaction 07/23/2019   Rectocele 07/16/2019   Vaginal dryness, menopausal 07/16/2019   Hot flashes 07/16/2019   Hematuria 07/16/2019   Dysuria 07/16/2019   Psychotic disorder (HCC) 03/13/2018    Allergies:  Allergies  Allergen Reactions   Doxycycline Nausea Only    Respiratory Distress, nausea, loss of appetite   Penicillins Hives and Nausea Only   Ciprofloxacin  Other (See Comments)    Dry mouth, skin tingling, panicky, stomach pain, anxious and nervous    Estrace  [Estradiol ]     Vaginal discharge    Mirtazapine     Made BP drop and felt faint    Nitrofurantoin  Other (See Comments)    Chills and hot flashes    Polymox [Amoxicillin] Hives   Progesterone      Burning / vaginal discharge    Medications:  Current Outpatient Medications:    cephALEXin  (KEFLEX ) 500 MG capsule, Take 1 capsule (500 mg total) by mouth 2 (two) times daily for 7 days., Disp: 14 capsule, Rfl: 0   acetaminophen  (TYLENOL ) 500 MG tablet, Take 1,000 mg by mouth every 6 (six) hours as needed for moderate pain., Disp: , Rfl:    ALPRAZolam  (XANAX ) 1 MG tablet, Take 1 mg by mouth 4 (four) times daily as needed for anxiety. , Disp: , Rfl:    amLODipine  (NORVASC ) 10 MG tablet, TAKE 1 TABLET(10 MG) BY MOUTH DAILY, Disp: 90 tablet, Rfl: 0   fexofenadine (ALLEGRA) 180 MG tablet, Take 180 mg by mouth daily as needed for allergies or rhinitis., Disp: , Rfl:    fluticasone  (FLONASE ) 50 MCG/ACT nasal spray, Place 2 sprays into both nostrils daily., Disp: 16 g, Rfl: 1   imipramine  (TOFRANIL ) 50 MG tablet, Take 50 mg by mouth at bedtime., Disp: , Rfl:    metoprolol  tartrate (LOPRESSOR ) 25 MG tablet,  Take 1 tablet (25 mg total) by mouth 2 (two) times daily., Disp: 180 tablet, Rfl: 2   Multiple Vitamin (MULTIVITAMIN) tablet, Take 1 tablet by mouth daily., Disp: , Rfl:    phenazopyridine  (PYRIDIUM ) 200 MG tablet, Take 1 tablet (200 mg total) by mouth 3 (three) times daily as needed for pain (urethral spasm)., Disp: 10 tablet, Rfl: 1   Probiotic Product (PROBIOTIC PO), Take 1 capsule by mouth daily., Disp: , Rfl:    rOPINIRole  (REQUIP ) 0.5 MG tablet, Take 1 tablet (0.5 mg total) by mouth at bedtime., Disp: 90 tablet, Rfl: 2   simvastatin  (ZOCOR ) 20 MG tablet, TAKE 1 TABLET(20 MG) BY MOUTH DAILY IN THE AFTERNOON, Disp: 90 tablet, Rfl: 1   sulfamethoxazole -trimethoprim  (BACTRIM  DS) 800-160 MG tablet, Take 1 tablet by mouth 2 (two) times daily. Take 1 bid, Disp: 14 tablet, Rfl: 0   triamcinolone  cream (KENALOG ) 0.1 %, Apply 1 Application topically 2 (two) times daily., Disp: 30 g, Rfl: 0   Vitamin D , Ergocalciferol , (DRISDOL ) 1.25 MG (50000 UNIT) CAPS capsule,  Take 1 capsule (50,000 Units total) by mouth every 7 (seven) days., Disp: 20 capsule, Rfl: 1  Observations/Objective: Patient is well-developed, well-nourished in no acute distress.  Resting comfortably at home.  Head is normocephalic, atraumatic.  No labored breathing. Speech is clear and coherent with logical content.  Patient is alert and oriented at baseline.   Assessment and Plan: 1. Frequent UTI (Primary) - cephALEXin  (KEFLEX ) 500 MG capsule; Take 1 capsule (500 mg total) by mouth 2 (two) times daily for 7 days.  Dispense: 14 capsule; Refill: 0  Classic UTI symptoms with absence of alarm signs or symptoms. Prior history of UTI. Will treat empirically with Keflex  for suspected uncomplicated cystitis. She has Urology appt scheduled so she is to keep this.  Supportive measures and OTC medications reviewed. Strict in-person evaluation precautions discussed.    Follow Up Instructions: I discussed the assessment and treatment plan with  the patient. The patient was provided an opportunity to ask questions and all were answered. The patient agreed with the plan and demonstrated an understanding of the instructions.  A copy of instructions were sent to the patient via MyChart unless otherwise noted below.   The patient was advised to call back or seek an in-person evaluation if the symptoms worsen or if the condition fails to improve as anticipated.    Leah Velma Lunger, PA-C

## 2024-04-16 NOTE — Patient Instructions (Signed)
 Earnie Maier, thank you for joining [name], PA-C for today's virtual visit.  While this provider is not your primary care provider (PCP), if your PCP is located in our provider database this encounter information will be shared with them immediately following your visit.   A [facility] MyChart account gives you access to today's visit and all your visits, tests, and labs performed at [facility]  click here if you don't have a [facility] MyChart account or go to [email or web address]  Consent: (Patient) Leah Padilla provided verbal consent for this virtual visit at the beginning of the encounter.  Current Medications:  Current Outpatient Medications:    cephALEXin  (KEFLEX ) 500 MG capsule, Take 1 capsule (500 mg total) by mouth 2 (two) times daily for 7 days., Disp: 14 capsule, Rfl: 0   acetaminophen  (TYLENOL ) 500 MG tablet, Take 1,000 mg by mouth every 6 (six) hours as needed for moderate pain., Disp: , Rfl:    ALPRAZolam  (XANAX ) 1 MG tablet, Take 1 mg by mouth 4 (four) times daily as needed for anxiety. , Disp: , Rfl:    amLODipine  (NORVASC ) 10 MG tablet, TAKE 1 TABLET(10 MG) BY MOUTH DAILY, Disp: 90 tablet, Rfl: 0   fexofenadine (ALLEGRA) 180 MG tablet, Take 180 mg by mouth daily as needed for allergies or rhinitis., Disp: , Rfl:    fluticasone  (FLONASE ) 50 MCG/ACT nasal spray, Place 2 sprays into both nostrils daily., Disp: 16 g, Rfl: 1   imipramine  (TOFRANIL ) 50 MG tablet, Take 50 mg by mouth at bedtime., Disp: , Rfl:    metoprolol  tartrate (LOPRESSOR ) 25 MG tablet, Take 1 tablet (25 mg total) by mouth 2 (two) times daily., Disp: 180 tablet, Rfl: 2   Multiple Vitamin (MULTIVITAMIN) tablet, Take 1 tablet by mouth daily., Disp: , Rfl:    phenazopyridine  (PYRIDIUM ) 200 MG tablet, Take 1 tablet (200 mg total) by mouth 3 (three) times daily as needed for pain (urethral spasm)., Disp: 10 tablet, Rfl: 1   Probiotic Product (PROBIOTIC PO), Take 1 capsule by mouth  daily., Disp: , Rfl:    rOPINIRole  (REQUIP ) 0.5 MG tablet, Take 1 tablet (0.5 mg total) by mouth at bedtime., Disp: 90 tablet, Rfl: 2   simvastatin  (ZOCOR ) 20 MG tablet, TAKE 1 TABLET(20 MG) BY MOUTH DAILY IN THE AFTERNOON, Disp: 90 tablet, Rfl: 1   sulfamethoxazole -trimethoprim  (BACTRIM  DS) 800-160 MG tablet, Take 1 tablet by mouth 2 (two) times daily. Take 1 bid, Disp: 14 tablet, Rfl: 0   triamcinolone  cream (KENALOG ) 0.1 %, Apply 1 Application topically 2 (two) times daily., Disp: 30 g, Rfl: 0   Vitamin D , Ergocalciferol , (DRISDOL ) 1.25 MG (50000 UNIT) CAPS capsule, Take 1 capsule (50,000 Units total) by mouth every 7 (seven) days., Disp: 20 capsule, Rfl: 1   Medications ordered in this encounter:  Meds ordered this encounter  Medications   cephALEXin  (KEFLEX ) 500 MG capsule    Sig: Take 1 capsule (500 mg total) by mouth 2 (two) times daily for 7 days.    Dispense:  14 capsule    Refill:  0    Supervising Provider:   BLAISE ALEENE KIDD [[id number]]     *If you need refills on other medications prior to your next appointment, please contact your pharmacy*  Follow-Up: Call back or seek an in-person evaluation if the symptoms worsen or if the condition fails to improve as anticipated.  [facility] [phone]  Other Instructions Your symptoms are consistent with a bladder infection, also  called acute cystitis. Please take your antibiotic (Keflex ) as directed until all pills are gone.  Stay very well hydrated.  Consider a daily probiotic (Align, Culturelle, or Activia) to help prevent stomach upset caused by the antibiotic.  Taking a probiotic daily may also help prevent recurrent UTIs.  Also consider taking AZO (Phenazopyridine ) tablets to help decrease pain with urination.    Please follow-up with your urologist as scheduled. If you note any non-resolving, new, or worsening symptoms despite treatment, please seek an in-person evaluation ASAP.    Urinary Tract Infection A  urinary tract infection (UTI) can occur any place along the urinary tract. The tract includes the kidneys, ureters, bladder, and urethra. A type of germ called bacteria often causes a UTI. UTIs are often helped with antibiotic medicine.  HOME CARE  If given, take antibiotics as told by your doctor. Finish them even if you start to feel better. Drink enough fluids to keep your pee (urine) clear or pale yellow. Avoid tea, drinks with caffeine, and bubbly (carbonated) drinks. Pee often. Avoid holding your pee in for a long time. Pee before and after having sex (intercourse). Wipe from front to back after you poop (bowel movement) if you are a woman. Use each tissue only once. GET HELP RIGHT AWAY IF:  You have back pain. You have lower belly (abdominal) pain. You have chills. You feel sick to your stomach (nauseous). You throw up (vomit). Your burning or discomfort with peeing does not go away. You have a fever. Your symptoms are not better in 3 days. MAKE SURE YOU:  Understand these instructions. Will watch your condition. Will get help right away if you are not doing well or get worse. Document Released: 03/06/2008 Document Revised: 06/12/2012 Document Reviewed: 04/18/2012 [facility] Patient Information 2015 [place], [place]. This information is not intended to replace advice given to you by your health care provider. Make sure you discuss any questions you have with your health care provider.    If you have been instructed to have an in-person evaluation today at a local Urgent Care facility, please use the link below. It will take you to a list of all of our available [facility], including address, phone number and hours of operation. Please do not delay care.  [facility]  If you or a family member do not have a primary care provider, use the link below to schedule a visit and establish care. When you choose a [place] primary care physician or advanced  practice provider, you gain a long-term partner in health. Find a Primary Care Provider  Learn more about [facility]'s in-office and virtual care options: [facility] - Get Care Now

## 2024-04-16 NOTE — Telephone Encounter (Signed)
 FYI - patient req antibiotics without visit. Advised that the request would be sent to the office. Please contact patient if provider would like telehealth cancelled.  FYI Only or Action Required?: Action required by provider: clinical question for provider.  Patient was last seen in primary care on 03/20/2024 by Zarwolo, Gloria, FNP.  Called Nurse Triage reporting Dysuria.  Symptoms began several days ago.  Interventions attempted: Nothing.  Symptoms are: unchanged.  Triage Disposition: See HCP Within 4 Hours (Or PCP Triage)  Patient/caregiver understands and will follow disposition?: Yes  Copied from CRM [id number]. Topic: Clinical - Red Word Triage >> Apr 16, 2024  9:39 AM Tiffini S wrote: [facility] that prompted transfer to Nurse Triage: Patient finished antibiotic for a UTI but still have symptoms (frequent urination, burning and clouding urine- painful) patient do not feel good. Reason for Disposition  Side (flank) or lower back pain present  Answer Assessment - Initial Assessment Questions 1. SEVERITY: How bad is the pain?  (e.g., Scale 1-10; mild, moderate, or severe)     Medium 2. FREQUENCY: How many times have you had painful urination today?      Q3 times overnight 3. PATTERN: Is pain present every time you urinate or just sometimes?      Intermittent 4. ONSET: When did the painful urination start?      Around 04/11/24 5. FEVER: Do you have a fever? If Yes, ask: What is your temperature, how was it measured, and when did it start?     No 6. PAST UTI: Have you had a urine infection before? If Yes, ask: When was the last time? and What happened that time?      Yes, last 03/20/24 7. CAUSE: What do you think is causing the painful urination?  (e.g., UTI, scratch, Herpes sore)     UTI 8. OTHER SYMPTOMS: Do you have any other symptoms? (e.g., blood in urine, flank pain, genital sores, urgency, vaginal discharge)     Mild lower back pain, increased  urgency  Protocols used: Urination Pain - Female-A-AH

## 2024-04-18 DIAGNOSIS — F411 Generalized anxiety disorder: Secondary | ICD-10-CM | POA: Diagnosis not present

## 2024-04-24 ENCOUNTER — Other Ambulatory Visit: Payer: Self-pay | Admitting: Family Medicine

## 2024-04-24 DIAGNOSIS — I10 Essential (primary) hypertension: Secondary | ICD-10-CM

## 2024-04-25 DIAGNOSIS — F411 Generalized anxiety disorder: Secondary | ICD-10-CM | POA: Diagnosis not present

## 2024-04-29 DIAGNOSIS — N200 Calculus of kidney: Secondary | ICD-10-CM | POA: Insufficient documentation

## 2024-04-29 NOTE — Progress Notes (Unsigned)
 Name: Leah Padilla DOB: December 13, 1960 MRN: [id number]  History of Present Illness: Ms. Leah Padilla is a 63 y.o. female who presents today as a new patient at [facility] [place]. All available relevant medical records have been reviewed.  Relevant History includes: 1. Kidney stones. - 07/28/2022: RUS showed a few small nonobstructing calculi in the upper pole of left kidney. 2. Vaginal atrophy. 3. Rectocele.  She reports chief complaint of recurrent UTls.  Urine culture results in past 12 months: - 05/10/2023: Positive for Staphylococcus hominis  - 08/23/2023: Positive for Staphylococcus epidermidis  - 11/19/2023: Negative  - 02/18/2024: Positive for Proteus mirabilis - 03/04/2024: Negative   Urinary Symptoms: She reports history of UTls since she was a child.   Most recently she was treated via telemedicine on 04/16/2024 with Keflex  for suspected UTI. Per that note: History of frequent UTI from a young age, worsening over the past few years. Saw PCP a month ago and diagnosed for a UTI via Urinalysis (no culture sent). Was given 5-day course of Bactrim  which she completed with resolution of symptoms. Referral to Urology was placed as well at time of that visit -- Urology appointment 7/30. Notes recurrence of symptoms over the past few days with urinary frequency, urgency, dysuria (milder) and fatigue. Notes some cloudy urine. Denies fever, chills, hematuria, nausea/vomiting. Denies flank pain.  She {Actions; denies-reports:120008} acute UTI symptoms today.  At baseline: She {Actions; denies-reports:120008} urinary urgency, frequency, dysuria, gross hematuria, hesitancy, straining to void, or sensations of incomplete emptying. She reports voiding *** times per day and *** times at night. She {Actions; denies-reports:120008} pushing on a bulge in order to empty her bladder.  She {Actions; denies-reports:120008} urge incontinence. She {Actions; denies-reports:120008} stress  incontinence with ***cough/***laugh/***sneeze/***heavy lifting/***exercise. She {Actions; denies-reports:120008} enuresis. She leaks *** times per ***. Wears *** ***pads / ***diapers per day. She states the ***SUI / ***UUI is predominant. This has been going on for {NUMBERS 1-12:18279} {days/wks/mos/yrs:310907} and {ACTION; IS/IS WNU:[id number]} significantly bothersome. In terms of treatment, She has tried ***.  She {Actions; denies-reports:120008} caffeine intake.  She {Actions; denies-reports:120008} history of pyelonephritis.  She {Actions; denies-reports:120008} history of kidney stones.  Vaginal / prolapse Symptoms: She {Actions; denies-reports:120008} vaginal bulge sensation.  She {Actions; denies-reports:120008} seeing a vaginal bulge. This bulge is bothersome and is the size of ***. It was first noticed ***.  She {Actions; denies-reports:120008} vaginal pain, bleeding, or discharge.  She {Actions; denies-reports:120008} use of topical vaginal estrogen cream.  Bowel Symptoms: She has a continent bowel movement *** time(s) per ***. Typical Bristol stool scale of continent bowel episodes is Type ***. She {Actions; denies-reports:120008} Fl episodes. Fecal incontinence started *** and occurs *** times per week. Typical Bristol stool scale of incontinent bowel episodes is Type ***. She {Actions; denies-reports:120008} straining and {Actions; denies-reports:120008} splinting to defecate. She {Actions; denies-reports:120008} rectal bleeding. She {Actions; denies-reports:120008} feeling like she fully empties her rectum with defecation. She {Actions; denies-reports:120008} urgency with defecation. She {Actions; denies-reports:120008} difficulty wiping clean. She {Actions; denies-reports:120008} taking laxatives, stool softeners, or fiber supplements. Last colonoscopy was***.  Past OB/GYN History: OB History     Gravida  2   Para  2   Term  2   Preterm      AB      Living  2       SAB      IAB      Ectopic      Multiple      Live Births  2  She {Actions; denies-reports:120008} being sexually active.  She {Actions; denies-reports:120008} dyspareunia. ***Dyspareunia is located ***at the introitus / ***deep inside the vagina. She has *** child(ren) who were delivered ***vaginally ***via c-section. She {Actions; denies-reports:120008} significant tearing with that ***delivery / those ***deliveries. She is {DESC; PRE/POST:32304} menopausal.  Last pap smear was ***. She {Actions; denies-reports:120008} history of *** hysterectomy.  Medications: Current Outpatient Medications  Medication Sig Dispense Refill   acetaminophen  (TYLENOL ) 500 MG tablet Take 1,000 mg by mouth every 6 (six) hours as needed for moderate pain.     ALPRAZolam  (XANAX ) 1 MG tablet Take 1 mg by mouth 4 (four) times daily as needed for anxiety.      amLODipine  (NORVASC ) 10 MG tablet TAKE 1 TABLET(10 MG) BY MOUTH DAILY 90 tablet 0   fexofenadine (ALLEGRA) 180 MG tablet Take 180 mg by mouth daily as needed for allergies or rhinitis.     fluticasone  (FLONASE ) 50 MCG/ACT nasal spray Place 2 sprays into both nostrils daily. 16 g 1   imipramine  (TOFRANIL ) 50 MG tablet Take 50 mg by mouth at bedtime.     metoprolol  tartrate (LOPRESSOR ) 25 MG tablet Take 1 tablet (25 mg total) by mouth 2 (two) times daily. 180 tablet 2   Multiple Vitamin (MULTIVITAMIN) tablet Take 1 tablet by mouth daily.     phenazopyridine  (PYRIDIUM ) 200 MG tablet Take 1 tablet (200 mg total) by mouth 3 (three) times daily as needed for pain (urethral spasm). 10 tablet 1   Probiotic Product (PROBIOTIC PO) Take 1 capsule by mouth daily.     rOPINIRole  (REQUIP ) 0.5 MG tablet Take 1 tablet (0.5 mg total) by mouth at bedtime. 90 tablet 2   simvastatin  (ZOCOR ) 20 MG tablet TAKE 1 TABLET(20 MG) BY MOUTH DAILY IN THE AFTERNOON 90 tablet 1   sulfamethoxazole -trimethoprim  (BACTRIM  DS) 800-160 MG tablet Take 1 tablet by mouth 2  (two) times daily. Take 1 bid 14 tablet 0   triamcinolone  cream (KENALOG ) 0.1 % Apply 1 Application topically 2 (two) times daily. 30 g 0   Vitamin D , Ergocalciferol , (DRISDOL ) 1.25 MG (50000 UNIT) CAPS capsule Take 1 capsule (50,000 Units total) by mouth every 7 (seven) days. 20 capsule 1   No current facility-administered medications for this visit.    Allergies: Allergies  Allergen Reactions   Doxycycline Nausea Only    Respiratory Distress, nausea, loss of appetite   Penicillins Hives and Nausea Only   Ciprofloxacin  Other (See Comments)    Dry mouth, skin tingling, panicky, stomach pain, anxious and nervous    Estrace  [Estradiol ]     Vaginal discharge    Mirtazapine     Made BP drop and felt faint    Nitrofurantoin  Other (See Comments)    Chills and hot flashes    Polymox [Amoxicillin] Hives   Progesterone      Burning / vaginal discharge     Past Medical History:  Diagnosis Date   Anxiety    Bell's palsy    Depression    High cholesterol    Hypertension    Past Surgical History:  Procedure Laterality Date   AXILLARY LYMPH NODE BIOPSY Right 01/17/2022   Procedure: AXILLARY LYMPH NODE BIOPSY;  Surgeon: Evonnie Dorothyann LABOR, DO;  Location: [facility];  Service: General;  Laterality: Right;   BIOPSY  08/01/2022   Procedure: BIOPSY;  Surgeon: Cindie Carlin POUR, DO;  Location: [facility];  Service: Endoscopy;;   COLONOSCOPY N/A 04/17/2013   Procedure: COLONOSCOPY;  Surgeon: Claudis RAYMOND Rivet, MD;  Location: [facility];  Service: Endoscopy;  Laterality: N/A;  930   COLONOSCOPY WITH PROPOFOL  N/A 08/01/2022   Procedure: COLONOSCOPY WITH PROPOFOL ;  Surgeon: Cindie Carlin POUR, DO;  Location: [facility];  Service: Endoscopy;  Laterality: N/A;  1:30 pm   CYST EXCISION     neck   Family History  Problem Relation Age of Onset   Multiple myeloma Mother    Stroke Father    Breast cancer Sister    Stroke Other    Diabetes Other    Colon cancer Neg Hx    Social History    Socioeconomic History   Marital status: Divorced    Spouse name: Not on file   Number of children: 2   Years of education: Not on file   Highest education level: Not on file  Occupational History   Not on file  Tobacco Use   Smoking status: Former    Current packs/day: 0.00    Average packs/day: 0.5 packs/day for 13.0 years (6.5 ttl pk-yrs)    Types: Cigarettes    Start date: 12/14/2003    Quit date: 12/13/2016    Years since quitting: 7.3   Smokeless tobacco: Never  Vaping Use   Vaping status: Former  Substance and Sexual Activity   Alcohol use: Not Currently    Comment: occasional   Drug use: Yes    Frequency: 3.0 times per week    Types: Marijuana    Comment: last used 07/30/22   Sexual activity: Yes    Birth control/protection: Post-menopausal  Other Topics Concern   Not on file  Social History Narrative   Not on file   Social Drivers of Health   Financial Resource Strain: High Risk (08/04/2022)   Overall Financial Resource Strain (CARDIA)    Difficulty of Paying Living Expenses: Hard  Food Insecurity: No Food Insecurity (07/27/2022)   Hunger Vital Sign    Worried About Running Out of Food in the Last Year: Never true    Ran Out of Food in the Last Year: Never true  Transportation Needs: No Transportation Needs (07/27/2022)   PRAPARE - Administrator, Civil Service (Medical): No    Lack of Transportation (Non-Medical): No  Physical Activity: Not on file  Stress: Not on file  Social Connections: Not on file  Intimate Partner Violence: Not At Risk (07/27/2022)   Humiliation, Afraid, Rape, and Kick questionnaire    Fear of Current or Ex-Partner: No    Emotionally Abused: No    Physically Abused: No    Sexually Abused: No    SUBJECTIVE  Review of Systems Constitutional: Patient denies any unintentional weight loss or change in strength lntegumentary: Patient denies any rashes or pruritus Cardiovascular: Patient denies chest pain or  syncope Respiratory: Patient denies shortness of breath Gastrointestinal: ***As per HPI Musculoskeletal: Patient denies muscle cramps or weakness Neurologic: Patient denies convulsions or seizures Allergic/Immunologic: Patient denies recent allergic reaction(s) Hematologic/Lymphatic: Patient denies bleeding tendencies Endocrine: Patient denies heat/cold intolerance  GU: As per HPI.  OBJECTIVE There were no vitals filed for this visit. There is no height or weight on file to calculate BMI.  Physical Examination Constitutional: No obvious distress; patient is non-toxic appearing  Cardiovascular: No visible lower extremity edema.  Respiratory: The patient does not have audible wheezing/stridor; respirations do not appear labored  Gastrointestinal: Abdomen non-distended Musculoskeletal: Normal ROM of UEs  Skin: No obvious rashes/open sores  Neurologic: CN 2-12 grossly intact Psychiatric: Answered questions appropriately with normal  affect  Hematologic/Lymphatic/Immunologic: No obvious bruises or sites of spontaneous bleeding  UA: ***negative ***positive for *** leukocytes, *** blood, ***nitrites Urine microscopy: *** WBC/hpf, *** RBC/hpf, *** bacteria ***glucosuria (secondary to ***Jardiance ***Farxiga use) ***otherwise unremarkable  PVR: *** ml  ASSESSMENT No diagnosis found.  ***Recurrent UTls / ***History of UTls with insufficient urine culture data to formally validate recurrent UTI diagnosis:  We discussed the possible etiologies of recurrent UTls including ascending infection related to intercourse; ***vaginal atrophy; transmural infection that has been treated incompletely; urinary tract stones; incomplete bladder emptying with urinary stasis; kidney or bladder tumor; urethral diverticulum; and colonization of  ***vagina and urinary tract with pathologic, adherent organisms.   Patient has known UTI risk factors including:  - ***use of immunosuppressant medication -  ***T2DM - ***glucosuria secondary to ***Jardiance ***Farxiga use - ***incomplete bladder emptying - ***vaginal atrophy - ***fecal incontinence / soiling  ***We reviewed the diagnostic criteria for recurrent UTI and that we do not currently have adequate urine culture data to support or refute the formal diagnosis of recurrent UTI. Discussed other possible etiologies for urinary symptoms including but not limited to: non-infectious cystitis (such as interstitial cystitis), stone, mass, malignancy, vulvovaginal irritation, etc.  ***Will request any additional outside urine cultures for review.  *** We discussed the difference between urinalysis (urine dipstick test) vs. urine culture and why urine cultures are needed to determine appropriate diagnosis and treatment of urologic symptoms. *** We reviewed UTI symptoms which may include: dysuria, acutely increased urinary urgency and/or frequency, fever, acute mental status change / confusion, general malaise, fatigue, weakness. *** We discussed that urine color, clarity, and odor are not considered to be clinical indicators of UTI and do not warrant urine testing unless patient is acutely symptomatic for UTI.  For UTI prevention advised: > Maintain adequate fluid intake daily to flush out the urinary tract. > Go to the bathroom to urinate every 4-6 hours while awake to minimize urinary stasis / bacterial overgrowth in the bladder. ***> Starting topical vaginal estrogen cream. The rationale, appropriate use, and potential pros / cons were discussed in detail.  ***> UTI prophylaxis with antibiotic after intercourse. ***> UTI prophylaxis with a daily low dose antibiotic.   Handout provided with additional recommendations / options for UTI prevention.   For management of acute UTI symptoms: - ***Discussed option to take over-the-counter Pyridium  (commonly known under the AZO brand name) for bladder pain. No more than 3 days at a time.  ***We  discussed the role of diagnostic testing for further evaluation and investigation of potential infectious nidus such as: cystoscopy - RUS CT stone study Microgen PCR testing  Patient ultimately decided to start with *** and ***will consider OTC supplements for UTI prevention. Handout provided.  We agreed to plan for follow up in *** months or sooner if needed.  Patient verbalized understanding of and agreement with current plan. All questions were answered.  PLAN Advised the following: ***. Start topical vaginal estrogen cream as prescribed. ***. Maintain adequate fluid intake daily to flush out the urinary tract. ***. Urinate every 4-6 hours while awake to minimize urinary stasis / bacterial overgrowth in the bladder. ***. Consider OTC supplements for UTI prevention. ***. ***No follow-ups on file.  No orders of the defined types were placed in this encounter.   It has been explained that the patient is to follow regularly with their PCP in addition to all other providers involved in their care and to follow instructions provided by these respective offices. Patient  advised to contact urology clinic if any urologic-pertaining questions, concerns, new symptoms or problems arise in the interim period.  There are no Patient Instructions on file for this visit.  Electronically signed by:  Lauraine KYM Oz, MSN, FNP-C, CUNP 04/29/2024 1:06 PM

## 2024-04-30 ENCOUNTER — Encounter: Payer: Self-pay | Admitting: Urology

## 2024-04-30 ENCOUNTER — Ambulatory Visit (INDEPENDENT_AMBULATORY_CARE_PROVIDER_SITE_OTHER): Admitting: Urology

## 2024-04-30 VITALS — BP 108/74 | HR 71

## 2024-04-30 DIAGNOSIS — N952 Postmenopausal atrophic vaginitis: Secondary | ICD-10-CM | POA: Diagnosis not present

## 2024-04-30 DIAGNOSIS — N200 Calculus of kidney: Secondary | ICD-10-CM | POA: Diagnosis not present

## 2024-04-30 DIAGNOSIS — N390 Urinary tract infection, site not specified: Secondary | ICD-10-CM | POA: Diagnosis not present

## 2024-04-30 DIAGNOSIS — Z87440 Personal history of urinary (tract) infections: Secondary | ICD-10-CM

## 2024-04-30 LAB — URINALYSIS, ROUTINE W REFLEX MICROSCOPIC
Bilirubin, UA: NEGATIVE
Glucose, UA: NEGATIVE
Ketones, UA: NEGATIVE
Nitrite, UA: NEGATIVE
Protein,UA: NEGATIVE
Specific Gravity, UA: <1.005 — ABNORMAL LOW (ref 1.005–1.030)
Urobilinogen, Ur: 0.2 mg/dL (ref 0.2–1.0)
pH, UA: 6.5 (ref 5.0–7.5)

## 2024-04-30 LAB — BLADDER SCAN AMB NON-IMAGING: Scan Result: 78

## 2024-04-30 LAB — MICROSCOPIC EXAMINATION

## 2024-04-30 NOTE — Patient Instructions (Signed)
 UTI prevention / management:  UTI symptoms may include:  - Pain / burning / discomfort when urinating - Recent increase in urinary urgency (how quickly you feel like you need to rush to the bathroom) - Recent increase in urinary frequency (how often you are urinating) - Fever - Acute mental status change / confusion - Fatigue / Feeling tired - Weakness - Note: Urine color, clarity, and odor are not considered to be clinically significant indicators of UTI and do not warrant urine testing unless patient is also experiencing UTI symptoms such as those listed above.  Difference between Urinalysis (urine dipstick test) and Urine culture / Why urine culture often needed to determine appropriate diagnosis and treatment of urologic symptoms: > Urinalysis (urine dipstick test): A quick office test used as an indicator to determine whether or not further testing is necessary (such as a urine culture, urine microscopy, etc.) The urinalysis cannot differentiate a true bacterial UTI or give a definitive diagnosis for the findings.  > Urine culture: May be performed based on the findings of a urinalysis to evaluate for UTI. Grows out on a petri dish for 48-72 hours. Provides important information about: whether or not bacterial growth is present and if so: what the predominant bacteria is which antibiotics will work best against that bacteria That information is important so that we can diagnose and treat patients appropriately as there are other conditions which may mimic UTls which must not be missed (such as cancer, interstitial cystitis, stones, etc.). Assists us  with antibiotic stewardship to minimize patient's risk for developing antibiotic resistance (getting to a point where no antibiotics work anymore).  Options when UTI symptoms occur: 1. Call [facility] [place] and request to speak with triage nurse (phone # [phone], select option 3). In accordance with clinic guidelines  the nurse will determine next steps based on patient-reported symptoms, which may include: same-day lab visit to provide urine specimen, recommendation to schedule Urology office visit appointment for further evaluation, recommendation to proceed to ER, etc. 2. Call your Primary Care Provider (PCP) office to request urgent / same-day visit. Be sure to request for urine culture to be ordered and have results faxed to Urology (fax # [phone]).  3. Go to urgent care. Be sure to request for urine culture to be ordered and have results faxed to Urology (fax # [phone]).   For bladder pain/ burning with urination: - Can take over-the-counter Pyridium (phenazopyridine; commonly known under the AZO brand) for a few days as needed. Limit use to no more than 3 days consecutively due to risk for methemoglobinemia, liver function issues, and bone health damage with long term use of Pyridium. - Alternative: Prescription urinary analgesics (such as Uribel, Urogesic blue, Urelle, Uro-MP). Often expensive / poorly covered by insurance unfortunately.  Options / recommendations for UTI prevention: - Low dose antibiotic daily for UTI prophylaxis. - Topical vaginal estrogen for vaginal atrophy (aka Genitourinary Syndrome of Menopause (GSM)). - Adequate daily fluid intake to flush out the urinary tract. - Go to the bathroom to urinate every 4-6 hours while awake to minimize urinary stasis / bacterial overgrowth in the bladder. - Proanthocyanidin (PAC) supplement 36 mg daily; must be soluble (insoluble form of PAC will be ineffective). Recommended brand: Ellura. This is an over-the-counter supplement (often must be found/ purchased online) supplement derived from cranberries with concentrated active component: Proanthocyanidin (PAC) 36 mg daily. Decreases bacterial adherence to bladder lining.  - D-mannose powder (2 grams daily). This is an over-the-counter supplement  which decreases bacterial adherence to bladder  lining (it is a sugar that inhibits bacterial adherence to urothelial cells by binding to the pili of enteric bacteria). Take as per manufacturer recommendation. Can be used as an alternative or in addition to the concentrated cranberry supplement.  - Vitamin C supplement to acidify urine to minimize bacterial growth.  - Probiotic to maintain healthy vaginal microbiome to suppress bacteria at urethral opening. Brand recommendations: Estill Hemming (includes probiotic & D-mannose ), Feminine Balance (highest concentration of lactobacillus) or Hyperbiotic Pro 15.  Note for patients with diabetes:  - Be aware that D-mannose contains sugar.  Note for patients with interstitial cystitis (IC):  - Patients with IC should typically avoid cranberry/ PAC supplements and Vitamin C supplements due to their acidity, which may exacerbate IC-related bladder pain. - Symptoms of true bacterial UTI can overlap / mimic symptoms of an IC flare up. Antibiotic use is NOT indicated for IC flare ups. Urine culture needed prior to antibiotic treatment for IC patients. The goal is to minimize your risk for developing antibiotic-resistant bacteria.    Vaginal atrophy I Genitourinary syndrome of menopause (GSM):  What it is: Changes in the vaginal environment (including the vulva and urethra) including: Thinning of the epithelium (skin/ mucosa surface) Can contribute to urinary urgency and frequency Can contribute to dryness, itching, irritation of the vulvar and vaginal tissue Can contribute to pain with intercourse Can contribute to physical changes of the labia, vulva, and vagina such as: Narrowing of the vaginal opening Decreased vaginal length Loss of labial architecture Labial adhesions Pale color of vulvovaginal tissue Loss of pubic hair Allows bacteria to become adherent Results in increased risk for urinary tract infection (UTI) due to bacterial overgrowth and migration up the urethra into the bladder Change in  vaginal pH (acid/ base balance) Allows for alteration / disruption of the normal bacterial flora / microbiome Results in increased risk for urinary tract infection (UTI) due to bacterial overgrowth  Treatment options: Over-the-counter lubricants (see list below). Prescription vaginal estrogen replacement. Options: Topical vaginal estrogen (estradiol ) cream: (Estrace , Premarin, compounded) Apply as directed Estring  vaginal ring Exchanged every 3 months (either at home or in office by provider) Vagifem  vaginal tablet Inserted nightly for 2 weeks then twice a week (long term) lntrarosa vaginal suppository Vaginal DHEA: converts to estrogen in vaginal tissue without systemic effect Inserted nightly (long term) 3. Vaginal laser therapy (Mona Edwina Gram touch) Performed in 3 treatments each 6 weeks apart (available in our [place] office). Can feel like a sunburn for 3-4 days after each treatment until new skin heals in. Usually not covered by insurance. Estimated cost is $1500 for all 3 sessions.  FYI regarding prescription vaginal estrogen treatment options: - All topical vaginal estrogen replacement options are equivalent in terms of efficacy. - Topical vaginal estrogen replacement will take about 3 months to be effective. - OK to have sex with any of the topical vaginal estrogen replacement options. - Topical vaginal estrogen replacement may sting/burn initially due to severe dryness, which will improve with ongoing treatment. - Studies have demonstrated negligible systemic absorption of low-dose intravaginal estrogen cream therefore the risk for cancer development or recurrence with this medication is minimal.  Genitourinary Syndrome of Menopause: AUA/SUFU/AUGS Guideline (2025) [employer]  Topical vaginal estrogen cream safe to use with breast cancer  history [email or web address].com.ee  Topical vaginal estrogen cream safe to use with blood clot history [email or web address]   Lubricants and Moisturizers for Treating Genitourinary Syndrome of Menopause and Vulvovaginal Atrophy Treatment Comments I  Available Products   Lubricants   Water-based Ingredients: Deionized water, glycerin, propylene glycol; latex safe; rare irritation; dry out with extended sexual activity Astroglide, Good Clean Love, K-Y Jelly, Natural, Organic, Pink, Sliquid, Sylk, Yes    Oil Based Ingredients: avocado, olive, peanut, corn; latex safe; can be used with silicone products; staining; safe (unless peanut allergy); non-irritating Coconut oil, vegetable oil, vitamin E oil  Silicone-Based Ingredients: Silicone polymers; staining; typically nonirritating, long lasting; waterproof; should not be used with silicone dilators, sexual toys, or gynecologic products Astroglide X, Oceanus Ultra Pure, Pink Silicone, Pjur Eros, Replens Silky Smooth, Silicone Premium JO, SKYN, Uberlube, [employer] Based Minimize harm to sperm motility; designed for couples trying to conceive:  Astroglide TTC, Conceive Plus, PreSeed, Yes Baby  Fertility Friendly Minimize harm to sperm motility; designed for couples trying to conceive:  Astroglide, TTC, Conceive Plus, PreSeed, Yes Baby  Vaginal Moisturizers   Vaginal Moisturizers For maintenance use 1 to 3 times weekly; can benefit women with dryness, chafing with AOL, and recurrent vaginal infections irrespective of sexual activity timing Balance Active Menopause Vaginal Moisturizing Lubricant, Canesintima Intimate Moisturizer, Replens, Rephresh, Sylk Natural Intimate Moisturizer, Yes Vaginal  Moisturizer  Hybrids Properties of both water and silicone-based products (combination of a vaginal lubricant and moisturizer); Non-irritating; good option for women with allergies and sensitivities Lubrigyn, Luvena  Suppositories Hyaluronic acid to retain moisture Revaree  Vulvar Soothing Creams/Oils    Medicated Creams Pain and burn relief; Ingredients: 4% Lidocaine, Aloe Vera gel Releveum ([place])  Non-Medicated Creams For anti-itch and moisture/maintenance; Ingredients: Coconut oil, Avocado oil, Shea Butter, Olive oil, Vitamin E Vajuvenate, Vmagic  Oils for moisture/maintenance: Coconut oil, Vitamin E oil, Emu oil

## 2024-05-02 DIAGNOSIS — F411 Generalized anxiety disorder: Secondary | ICD-10-CM | POA: Diagnosis not present

## 2024-05-04 LAB — URINE CULTURE

## 2024-05-05 ENCOUNTER — Ambulatory Visit: Payer: Self-pay | Admitting: Urology

## 2024-05-05 DIAGNOSIS — R8279 Other abnormal findings on microbiological examination of urine: Secondary | ICD-10-CM

## 2024-05-05 DIAGNOSIS — R399 Unspecified symptoms and signs involving the genitourinary system: Secondary | ICD-10-CM

## 2024-05-05 MED ORDER — SULFAMETHOXAZOLE-TRIMETHOPRIM 800-160 MG PO TABS: 1.0000 | ORAL_TABLET | Freq: Two times a day (BID) | ORAL | 0 refills | Status: DC

## 2024-05-19 ENCOUNTER — Other Ambulatory Visit: Payer: Self-pay | Admitting: Family Medicine

## 2024-05-19 DIAGNOSIS — E7849 Other hyperlipidemia: Secondary | ICD-10-CM

## 2024-05-21 ENCOUNTER — Encounter: Payer: Self-pay | Admitting: Adult Health

## 2024-05-21 ENCOUNTER — Ambulatory Visit: Admitting: Adult Health

## 2024-05-21 VITALS — BP 105/73 | HR 67 | Ht 68.5 in | Wt 150.0 lb

## 2024-05-21 DIAGNOSIS — N390 Urinary tract infection, site not specified: Secondary | ICD-10-CM | POA: Diagnosis not present

## 2024-05-21 DIAGNOSIS — N952 Postmenopausal atrophic vaginitis: Secondary | ICD-10-CM

## 2024-05-21 MED ORDER — ESTRADIOL 10 MCG VA TABS: ORAL_TABLET | VAGINAL | 3 refills | Status: DC

## 2024-05-21 NOTE — Progress Notes (Signed)
 Subjective:     Patient ID: Leah Padilla, female   DOB: [birth year], 63 y.o.   MRN: [id number]  HPI Leah Padilla is a 63 year old white female, divorced, PM in asked to be seen for recurrent UTI's possible related to vaginal atrophy. She tried PVC in past with burning.  Last pap was 03/17/24 NILM in [place].  PCP is Leah Zarwolo NP   Review of Systems Recurrent UTI Vaginal dryness Not having sex Reviewed past medical,surgical, social and family history. Reviewed medications and allergies.     Objective:   Physical Exam BP 105/73 (BP Location: Right Arm, Patient Position: Sitting, Cuff Size: Normal)   Pulse 67   Ht 5' 8.5 (1.74 m)   Wt 150 lb (68 kg)   BMI 22.48 kg/m     Skin warm and dry.Pelvic: external genitalia is normal in appearance no lesions, vagina: pale and atrophic,urethra has no lesions or masses noted, cervix:smooth, uterus: normal size, shape and contour, non tender, no masses felt, adnexa: no masses or tenderness noted. Bladder is non tender and no masses felt.  AA is 0 Fall risk is low    05/21/2024   10:35 AM 03/20/2024    3:42 PM 10/01/2023   10:53 AM  Depression screen PHQ 2/9  Decreased Interest 3 2 3   Down, Depressed, Hopeless 2 2 3   PHQ - 2 Score 5 4 6   Altered sleeping 1 1 2   Tired, decreased energy 3 3 3   Change in appetite 2 1 3   Feeling bad or failure about yourself  2 3 3   Trouble concentrating 1 1 2   Moving slowly or fidgety/restless 0 1 2  Suicidal thoughts 0 0 0  PHQ-9 Score 14 14 21   Difficult doing work/chores  Somewhat difficult Very difficult   Takes xanax     05/21/2024   10:36 AM 03/20/2024    3:43 PM 10/01/2023   10:54 AM 06/07/2023    1:32 PM  GAD 7 : Generalized Anxiety Score  Nervous, Anxious, on Edge 3 3 3 2   Control/stop worrying 3 3 3 3   Worry too much - different things 3 3 3 3   Trouble relaxing 2 2 3 2   Restless 1 1 3 2   Easily annoyed or irritable 0 0 3 1  Afraid - awful might happen 0 2 1 2   Total GAD 7 Score 12 14 19  15   Anxiety Difficulty  Somewhat difficult Very difficult Very difficult    Upstream - 05/21/24 1032       Pregnancy Intention Screening   Does the patient want to become pregnant in the next year? N/A    Does the patient's partner want to become pregnant in the next year? N/A    Would the patient like to discuss contraceptive options today? N/A      Contraception Wrap Up   Current Method Abstinence   PM   End Method Abstinence   PM   Contraception Counseling Provided No           Examination chaperoned by Clarita Salt LPN Assessment:     1. Vaginal atrophy (Primary) Will try vagifem  since PVC burned  Meds ordered this encounter  Medications   Estradiol  10 MCG TABS vaginal tablet    Sig: Place 1 tablet in vagina at bedtime for 2 weeks then 2 x weekly    Dispense:  30 tablet    Refill:  3    Supervising Provider:   JAYNE MINDER H [[id number]]  2. Recurrent UTI Has follow up with urology in September     Plan:     Follow up with me in 6 weeks for recheck

## 2024-05-26 ENCOUNTER — Ambulatory Visit: Payer: Self-pay

## 2024-05-26 NOTE — Telephone Encounter (Signed)
 Appt made.

## 2024-05-26 NOTE — Telephone Encounter (Signed)
 FYI Only or Action Required?: FYI only for provider.  Patient was last seen in primary care on 03/20/2024 by Zarwolo, Gloria, FNP.  Called Nurse Triage reporting Rash.  Symptoms began several weeks ago.  Interventions attempted: OTC medications: witch hazel and some itch medication and Ice/heat application.  Symptoms are: unchanged.  Triage Disposition: See Physician Within 24 Hours  Patient/caregiver understands and will follow disposition?: Yes      Copied from CRM #[id number]. Topic: Clinical - Red Word Triage >> May 26, 2024  9:40 AM Avram MATSU wrote: Red Word that prompted transfer to Nurse Triage: painful raised bumps on skins due to sarcoidosis Reason for Disposition  SEVERE itching (i.e., interferes with sleep, normal activities or school)  Answer Assessment - Initial Assessment Questions 1. APPEARANCE of RASH: What does the rash look like? (e.g., blisters, dry flaky skin, red spots, redness, sores)     Raised bumps, scaly  2. SIZE: How big are the spots? (e.g., tip of pen, eraser, coin; inches, centimeters)     Clusters in different spots but bumps are tiny  3. LOCATION: Where is the rash located?     Legs, abd,  4. COLOR: What color is the rash? (Note: It is difficult to assess rash color in people with darker-colored skin. When this situation occurs, simply ask the caller to describe what they see.)     red 5. ONSET: When did the rash begin?     A week ago  6. FEVER: Do you have a fever? If Yes, ask: What is your temperature, how was it measured, and when did it start?     no 7. ITCHING: Does the rash itch? If Yes, ask: How bad is the itch? (Scale 1-10; or mild, moderate, severe)     Mod-severe 8. CAUSE: What do you think is causing the rash?     Sarcoidosis  9. MEDICINE FACTORS: Have you started any new medicines within the last 2 weeks? (e.g., antibiotics)      no 10. OTHER SYMPTOMS: Do you have any other symptoms? (e.g., dizziness,  headache, sore throat, joint pain)       no  Protocols used: Rash or Redness - [facility]

## 2024-05-27 ENCOUNTER — Ambulatory Visit (INDEPENDENT_AMBULATORY_CARE_PROVIDER_SITE_OTHER): Payer: Self-pay | Admitting: Family Medicine

## 2024-05-27 ENCOUNTER — Other Ambulatory Visit: Payer: Self-pay | Admitting: Family Medicine

## 2024-05-27 ENCOUNTER — Encounter: Payer: Self-pay | Admitting: Family Medicine

## 2024-05-27 VITALS — BP 106/73 | HR 87 | Resp 16 | Ht 68.5 in | Wt 150.1 lb

## 2024-05-27 DIAGNOSIS — R21 Rash and other nonspecific skin eruption: Secondary | ICD-10-CM

## 2024-05-27 DIAGNOSIS — I10 Essential (primary) hypertension: Secondary | ICD-10-CM | POA: Diagnosis not present

## 2024-05-27 DIAGNOSIS — E559 Vitamin D deficiency, unspecified: Secondary | ICD-10-CM

## 2024-05-27 MED ORDER — BETAMETHASONE VALERATE 0.1 % EX OINT
TOPICAL_OINTMENT | CUTANEOUS | 0 refills | Status: DC
Start: 2024-05-27 — End: 2024-06-17

## 2024-05-27 MED ORDER — HYDROXYZINE PAMOATE 25 MG PO CAPS: 25.0000 mg | ORAL_CAPSULE | Freq: Three times a day (TID) | ORAL | 0 refills | Status: DC | PRN

## 2024-05-27 MED ORDER — PREDNISONE 5 MG PO TABS: 5.0000 mg | ORAL_TABLET | Freq: Every day | ORAL | 0 refills | Status: DC

## 2024-05-27 NOTE — Patient Instructions (Addendum)
 F/U as before Prednisone , steroid cream and hydroxyzine  are prescribed as discussed  You are referred to Dermatology, that office will call you with an apppointment  Continue to work on quitting vaping  Thanks for choosing [facility], we consider it a privelige to serve you.

## 2024-05-27 NOTE — Assessment & Plan Note (Addendum)
 Refer derm, hydroxyzine , prednisone  and betamethasone  prescribed

## 2024-05-30 ENCOUNTER — Other Ambulatory Visit: Payer: Self-pay

## 2024-05-30 ENCOUNTER — Encounter (HOSPITAL_COMMUNITY): Payer: Self-pay | Admitting: Emergency Medicine

## 2024-05-30 ENCOUNTER — Emergency Department (HOSPITAL_COMMUNITY)

## 2024-05-30 ENCOUNTER — Observation Stay (HOSPITAL_COMMUNITY)
Admission: EM | Admit: 2024-05-30 | Discharge: 2024-06-01 | Disposition: A | Discharge status: Home / Self Care | Attending: Internal Medicine | Admitting: Internal Medicine

## 2024-05-30 DIAGNOSIS — N390 Urinary tract infection, site not specified: Secondary | ICD-10-CM | POA: Diagnosis present

## 2024-05-30 DIAGNOSIS — N200 Calculus of kidney: Secondary | ICD-10-CM | POA: Diagnosis not present

## 2024-05-30 DIAGNOSIS — Z79899 Other long term (current) drug therapy: Secondary | ICD-10-CM | POA: Diagnosis not present

## 2024-05-30 DIAGNOSIS — G2581 Restless legs syndrome: Secondary | ICD-10-CM | POA: Insufficient documentation

## 2024-05-30 DIAGNOSIS — E7800 Pure hypercholesterolemia, unspecified: Secondary | ICD-10-CM | POA: Insufficient documentation

## 2024-05-30 DIAGNOSIS — K838 Other specified diseases of biliary tract: Secondary | ICD-10-CM | POA: Diagnosis not present

## 2024-05-30 DIAGNOSIS — N134 Hydroureter: Secondary | ICD-10-CM | POA: Diagnosis not present

## 2024-05-30 DIAGNOSIS — N1832 Chronic kidney disease, stage 3b: Secondary | ICD-10-CM | POA: Diagnosis present

## 2024-05-30 DIAGNOSIS — N179 Acute kidney failure, unspecified: Secondary | ICD-10-CM | POA: Insufficient documentation

## 2024-05-30 DIAGNOSIS — N1330 Unspecified hydronephrosis: Principal | ICD-10-CM | POA: Insufficient documentation

## 2024-05-30 DIAGNOSIS — I10 Essential (primary) hypertension: Secondary | ICD-10-CM

## 2024-05-30 DIAGNOSIS — K828 Other specified diseases of gallbladder: Secondary | ICD-10-CM | POA: Diagnosis not present

## 2024-05-30 DIAGNOSIS — N132 Hydronephrosis with renal and ureteral calculous obstruction: Secondary | ICD-10-CM | POA: Diagnosis not present

## 2024-05-30 DIAGNOSIS — R1032 Left lower quadrant pain: Secondary | ICD-10-CM | POA: Diagnosis present

## 2024-05-30 DIAGNOSIS — Z8639 Personal history of other endocrine, nutritional and metabolic disease: Secondary | ICD-10-CM

## 2024-05-30 LAB — CBC
HCT: 43.2 % (ref 36.0–46.0)
Hemoglobin: 14 g/dL (ref 12.0–15.0)
MCH: 29 pg (ref 26.0–34.0)
MCHC: 32.4 g/dL (ref 30.0–36.0)
MCV: 89.6 fL (ref 80.0–100.0)
Platelets: 320 K/uL (ref 150–400)
RBC: 4.82 MIL/uL (ref 3.87–5.11)
RDW: 12.9 % (ref 11.5–15.5)
WBC: 15 K/uL — ABNORMAL HIGH (ref 4.0–10.5)
nRBC: 0 % (ref 0.0–0.2)

## 2024-05-30 LAB — COMPREHENSIVE METABOLIC PANEL WITH GFR
ALT: 21 U/L (ref 0–44)
AST: 20 U/L (ref 15–41)
Albumin: 4 g/dL (ref 3.5–5.0)
Alkaline Phosphatase: 79 U/L (ref 38–126)
Anion gap: 13 (ref 5–15)
BUN: 15 mg/dL (ref 8–23)
CO2: 24 mmol/L (ref 22–32)
Calcium: 9.3 mg/dL (ref 8.9–10.3)
Chloride: 102 mmol/L (ref 98–111)
Creatinine, Ser: 1.19 mg/dL — ABNORMAL HIGH (ref 0.44–1.00)
GFR, Estimated: 52 mL/min — ABNORMAL LOW (ref >60)
Glucose, Bld: 109 mg/dL — ABNORMAL HIGH (ref 70–99)
Potassium: 3.9 mmol/L (ref 3.5–5.1)
Sodium: 139 mmol/L (ref 135–145)
Total Bilirubin: 0.7 mg/dL (ref 0.0–1.2)
Total Protein: 7.5 g/dL (ref 6.5–8.1)

## 2024-05-30 LAB — URINALYSIS, ROUTINE W REFLEX MICROSCOPIC
Bilirubin Urine: NEGATIVE
Glucose, UA: NEGATIVE mg/dL
Ketones, ur: NEGATIVE mg/dL
Nitrite: NEGATIVE
Protein, ur: 100 mg/dL — AB
Specific Gravity, Urine: 1.012 (ref 1.005–1.030)
WBC, UA: >50 WBC/hpf (ref 0–5)
pH: 7 (ref 5.0–8.0)

## 2024-05-30 LAB — LIPASE, BLOOD: Lipase: 31 U/L (ref 11–51)

## 2024-05-30 MED ORDER — AMLODIPINE BESYLATE 5 MG PO TABS: 10.0000 mg | ORAL_TABLET | Freq: Every day | ORAL | Status: DC

## 2024-05-30 MED ORDER — SODIUM CHLORIDE 0.9 % IV SOLN
1.0000 g | Freq: Once | INTRAVENOUS | Status: AC
  Administered 2024-05-30: 1 g via INTRAVENOUS
  Filled 2024-05-30: qty 10

## 2024-05-30 MED ORDER — HYDRALAZINE HCL 20 MG/ML IJ SOLN: 5.0000 mg | INTRAMUSCULAR | Status: DC | PRN

## 2024-05-30 MED ORDER — SIMVASTATIN 20 MG PO TABS
20.0000 mg | ORAL_TABLET | Freq: Every evening | ORAL | Status: DC
  Administered 2024-05-30 – 2024-05-31 (×2): 20 mg via ORAL
  Filled 2024-05-30 (×2): qty 1

## 2024-05-30 MED ORDER — MORPHINE SULFATE (PF) 2 MG/ML IV SOLN: 2.0000 mg | INTRAVENOUS | Status: DC | PRN

## 2024-05-30 MED ORDER — ALPRAZOLAM 1 MG PO TABS
1.0000 mg | ORAL_TABLET | Freq: Four times a day (QID) | ORAL | Status: DC | PRN
  Administered 2024-05-30 – 2024-06-01 (×3): 1 mg via ORAL
  Filled 2024-05-30 (×4): qty 1

## 2024-05-30 MED ORDER — ENOXAPARIN SODIUM 40 MG/0.4ML IJ SOSY
40.0000 mg | PREFILLED_SYRINGE | INTRAMUSCULAR | Status: DC
  Administered 2024-05-31: 40 mg via SUBCUTANEOUS
  Filled 2024-05-30: qty 0.4

## 2024-05-30 MED ORDER — LACTATED RINGERS IV SOLN: INTRAVENOUS | Status: DC

## 2024-05-30 MED ORDER — METOPROLOL TARTRATE 25 MG PO TABS
12.5000 mg | ORAL_TABLET | Freq: Two times a day (BID) | ORAL | Status: DC
  Administered 2024-05-30 – 2024-06-01 (×4): 12.5 mg via ORAL
  Filled 2024-05-30 (×4): qty 1

## 2024-05-30 MED ORDER — VITAMIN D (ERGOCALCIFEROL) 1.25 MG (50000 UNIT) PO CAPS
50000.0000 [IU] | ORAL_CAPSULE | ORAL | Status: DC
  Filled 2024-05-30 (×2): qty 1

## 2024-05-30 MED ORDER — SODIUM CHLORIDE 0.9 % IV SOLN
2.0000 g | INTRAVENOUS | Status: DC
  Administered 2024-05-31: 2 g via INTRAVENOUS
  Filled 2024-05-30: qty 20

## 2024-05-30 MED ORDER — ACETAMINOPHEN 325 MG PO TABS
650.0000 mg | ORAL_TABLET | Freq: Four times a day (QID) | ORAL | Status: DC | PRN
  Administered 2024-05-30 – 2024-05-31 (×2): 650 mg via ORAL
  Filled 2024-05-30 (×2): qty 2

## 2024-05-30 MED ORDER — LACTATED RINGERS IV SOLN: INTRAVENOUS | Status: AC

## 2024-05-30 MED ORDER — METOPROLOL TARTRATE 25 MG PO TABS: 25.0000 mg | ORAL_TABLET | Freq: Two times a day (BID) | ORAL | Status: DC

## 2024-05-30 MED ORDER — ACETAMINOPHEN 650 MG RE SUPP: 650.0000 mg | Freq: Four times a day (QID) | RECTAL | Status: DC | PRN

## 2024-05-30 MED ORDER — OXYCODONE HCL 5 MG PO TABS: 5.0000 mg | ORAL_TABLET | ORAL | Status: DC | PRN

## 2024-05-30 MED ORDER — KETOROLAC TROMETHAMINE 15 MG/ML IJ SOLN
15.0000 mg | Freq: Once | INTRAMUSCULAR | Status: AC
  Administered 2024-05-30: 15 mg via INTRAVENOUS
  Filled 2024-05-30: qty 1

## 2024-05-30 MED ORDER — IOHEXOL 300 MG/ML  SOLN
100.0000 mL | Freq: Once | INTRAMUSCULAR | Status: AC | PRN
  Administered 2024-05-30: 100 mL via INTRAVENOUS

## 2024-05-30 MED ORDER — SODIUM CHLORIDE 0.9 % IV SOLN: 1.0000 g | Freq: Once | INTRAVENOUS | Status: DC

## 2024-05-30 NOTE — ED Triage Notes (Signed)
 Pt c.o of LLQ abd pain x2 days. States she thinks it is a allergic reaction to a new medication she started on Wednesday. Denies N/V/D.

## 2024-05-30 NOTE — ED Provider Notes (Signed)
 [place] EMERGENCY DEPARTMENT AT [facility] Provider Note   CSN: [id number] Arrival date & time: 05/30/24  9078     Patient presents with: Abdominal Pain   Ceriah Padilla is a 63 y.o. female.  She was ER complaining of lower quadrant abdominal pain x 2 days.  Denies any GI bleeding, diarrhea or constipation.  She is unsure if it could be related to the fact that she started vaginal estrogen the day before.  No nausea or vomiting, no fevers or chills.  Denies exacerbating or alleviating factors.    Abdominal Pain      Prior to Admission medications   Medication Sig Start Date End Date Taking? Authorizing Provider  acetaminophen  (TYLENOL ) 500 MG tablet Take 1,000 mg by mouth every 6 (six) hours as needed for moderate pain.    [provider]  ALPRAZolam  (XANAX ) 1 MG tablet Take 1 mg by mouth 4 (four) times daily as needed for anxiety.  09/08/15   [provider]  amLODipine  (NORVASC ) 10 MG tablet TAKE 1 TABLET(10 MG) BY MOUTH DAILY 04/24/24   Zarwolo, Gloria, FNP  betamethasone  valerate ointment (VALISONE ) 0.1 % Apply sparingly to rash once daily for one week only 05/27/24   [name], MD  Estradiol  10 MCG TABS vaginal tablet Place 1 tablet in vagina at bedtime for 2 weeks then 2 x weekly 05/21/24   Signa Nest A, NP  fluticasone  (FLONASE ) 50 MCG/ACT nasal spray Place 2 sprays into both nostrils daily. 03/31/24   Zarwolo, Gloria, FNP  hydrOXYzine  (VISTARIL ) 25 MG capsule Take 1 capsule (25 mg total) by mouth every 8 (eight) hours as needed. 05/27/24   [name], MD  imipramine  (TOFRANIL ) 50 MG tablet Take 50 mg by mouth at bedtime.    [provider]  metoprolol  tartrate (LOPRESSOR ) 25 MG tablet Take 1 tablet (25 mg total) by mouth 2 (two) times daily. 10/01/23 05/27/24  Zarwolo, Gloria, FNP  predniSONE  (DELTASONE ) 5 MG tablet Take 1 tablet (5 mg total) by mouth daily with breakfast for 7 days. 05/27/24 06/03/24  [name], MD  rOPINIRole  (REQUIP ) 0.5 MG tablet Take 1 tablet (0.5 mg total) by mouth at bedtime. 10/01/23   Zarwolo, Gloria, FNP  simvastatin  (ZOCOR ) 20 MG tablet TAKE 1 TABLET(20 MG) BY MOUTH DAILY IN THE AFTERNOON 05/20/24   Zarwolo, Gloria, FNP  Vitamin D , Ergocalciferol , (DRISDOL ) 1.25 MG (50000 UNIT) CAPS capsule Take 1 capsule (50,000 Units total) by mouth every 7 (seven) days. 05/30/23   Zarwolo, Gloria, FNP    Allergies: Doxycycline, Penicillins, Ciprofloxacin , Estrace  [estradiol ], Mirtazapine, Nitrofurantoin , Polymox [amoxicillin], and Progesterone     Review of Systems  Gastrointestinal:  Positive for abdominal pain.    Updated Vital Signs BP 114/69 (BP Location: Right Arm)   Pulse 87   Temp 98.6 F (37 C) (Oral)   Resp 16   Ht 5' 8.5 (1.74 m)   Wt 68 kg   SpO2 100%   BMI 22.46 kg/m   Physical Exam Vitals and nursing note reviewed.  Constitutional:      General: She is not in acute distress.    Appearance: She is well-developed.  HENT:     Head: Normocephalic and atraumatic.  Eyes:     Conjunctiva/sclera: Conjunctivae normal.  Cardiovascular:     Rate and Rhythm: Normal rate and regular rhythm.     Heart sounds: No murmur heard. Pulmonary:     Effort: Pulmonary effort is normal. No respiratory distress.  Breath sounds: Normal breath sounds.  Abdominal:     Palpations: Abdomen is soft.     Tenderness: There is abdominal tenderness in the left lower quadrant. There is no right CVA tenderness, left CVA tenderness, guarding or rebound.  Musculoskeletal:        General: No swelling.     Cervical back: Neck supple.  Skin:    General: Skin is warm and dry.     Capillary Refill: Capillary refill takes less than 2 seconds.  Neurological:     Mental Status: She is alert.  Psychiatric:        Mood and Affect: Mood normal.     (all labs ordered are listed, but only abnormal results are displayed) Labs Reviewed  COMPREHENSIVE METABOLIC PANEL WITH GFR - Abnormal;  Notable for the following components:      Result Value   Glucose, Bld 109 (*)    Creatinine, Ser 1.19 (*)    GFR, Estimated 52 (*)    All other components within normal limits  CBC - Abnormal; Notable for the following components:   WBC 15.0 (*)    All other components within normal limits  LIPASE, BLOOD  URINALYSIS, ROUTINE W REFLEX MICROSCOPIC    EKG: None  Radiology: No results found.   Procedures   Medications Ordered in the ED - No data to display                                  Medical Decision Making This patient presents to the ED for concern of lower quadrant abdominal pain x 2 days, this involves an extensive number of treatment options, and is a complaint that carries with it a high risk of complications and morbidity.  The differential diagnosis includes diverticulitis, UTI, pyelonephritis, kidney stone, ovarian cyst, other   Co morbidities that complicate the patient evaluation :   High cholesterol   Additional history obtained:  Additional history obtained from EMR External records from outside source obtained and reviewed including prior notes and labs   Lab Tests:  I Ordered, and personally interpreted labs.  The pertinent results include: CBC with white blood cell count 15, lipase is normal, CMP with creatinine of 1.19, UA with moderate leukocytes, greater than 50 white blood blood cells and few bacteria   Imaging Studies ordered:  I ordered imaging studies including CT abdomen pelvis which shows left-sided hydroureter and hydronephrosis with 2 mm distal ureteral stone I independently visualized and interpreted imaging within scope of identifying emergent findings  I agree with the radiologist interpretation   Consultations Obtained:  I requested consultation with the urologist Dr. Sherrilee,  and discussed lab and imaging findings as well as pertinent plan - they recommend: For IV antibiotics, keep n.p.o. after midnight.  Patient has infected  stone, needs antibiotics but since the stone is distal hopefully she will pass it on her own since it is only 2 mm admit to hospitalist service  Gust with Dr. Sherlon   Problem List / ED Course / Critical interventions / Medication management  Left pelvic pain-patient has UTI and kidney stone, she does have a leukocytosis but is afebrile here.  She is not tachycardic though she is on metoprolol  which could mask up mild tachycardia.  Given Rocephin  based on recent pansensitive urine culture that showed Proteus.  Discussed with urology and hospitalist as above.  Patient agreeable plan of care and discharge. I ordered medication  including Toradol  for pain Reevaluation of the patient after these medicines showed that the patient improved I have reviewed the patients home medicines and have made adjustments as needed       Amount and/or Complexity of Data Reviewed Labs: ordered. Radiology: ordered.  Risk Prescription drug management. Decision regarding hospitalization.        Final diagnoses:  None    ED Discharge Orders     None          Suellen Sherran DELENA DEVONNA 05/30/24 1940    Towana Ozell BROCKS, MD 05/31/24 [phone]

## 2024-05-30 NOTE — H&P (Addendum)
 [facility] H&P   Patient Demographics:    Leah Padilla, is a 63 y.o. female  MRN: [id number]   DOB - January 19, 1961  Admit Date - 05/30/2024  Outpatient Primary MD for the patient is Zarwolo, Gloria, FNP  Referring MD/NP/PA: PA Celeste  Outpatient Specialists: Following with [facility] regarding her sarcoidosis  Patient coming from: Home  Chief Complaint  Patient presents with   Abdominal Pain      HPI:    Leah Padilla  is a 63 y.o. female, with past medical history of hypertension, hyperlipidemia, Bell's palsy, depression, and with history of lymphadenopathy, with diagnosis of sarcoidosis, where she is following at [facility] regarding that. Patient presents today secondary to complaints of abdominal pain, reports left lower quadrant abdominal pain for last 2 days, radiating to left groin area, denies fever, chills, no nausea, or vomiting, which prompted her to come to ED, with history of recurrent UTI.  Was recently urine culture growing Proteus 04/30/2024 pansensitive. -In ED her workup significant for positive UA, and CT abdomen pelvis significant for left hydronephrosis, so [facility] hospitalist consulted to admit.  Review of systems:     A full 10 point Review of Systems was done, except as stated above, all other Review of Systems were negative.   With Past History of the following :    Past Medical History:  Diagnosis Date   Allergy    Anemia    Anxiety    Bell's palsy    Depression    Heart murmur When I was a baby out grown   High cholesterol    Hypertension    Sarcoidosis       Past Surgical History:  Procedure Laterality Date   AXILLARY LYMPH NODE BIOPSY Right 01/17/2022   Procedure: AXILLARY LYMPH NODE BIOPSY;  Surgeon: Evonnie Dorothyann LABOR, DO;  Location: [facility];  Service: General;  Laterality: Right;   BIOPSY  08/01/2022   Procedure: BIOPSY;  Surgeon: Cindie Carlin POUR,  DO;  Location: [facility];  Service: Endoscopy;;   COLONOSCOPY N/A 04/17/2013   Procedure: COLONOSCOPY;  Surgeon: Claudis RAYMOND Rivet, MD;  Location: [facility];  Service: Endoscopy;  Laterality: N/A;  930   COLONOSCOPY WITH PROPOFOL  N/A 08/01/2022   Procedure: COLONOSCOPY WITH PROPOFOL ;  Surgeon: Cindie Carlin POUR, DO;  Location: [facility];  Service: Endoscopy;  Laterality: N/A;  1:30 pm   CYST EXCISION     neck      Social History:     Social History   Tobacco Use   Smoking status: Former    Current packs/day: 0.00    Average packs/day: 0.5 packs/day for 13.0 years (6.5 ttl pk-yrs)    Types: Cigarettes    Start date: 12/14/2003    Quit date: 12/13/2016    Years since quitting: 7.4   Smokeless tobacco: Never  Substance Use Topics   Alcohol use: Not Currently  Comment: occasional        Family History :     Family History  Problem Relation Age of Onset   Multiple myeloma Mother    Cancer Mother    Stroke Father    Diabetes Father    Heart disease Father    Breast cancer Sister    Stroke Other    Diabetes Other    Anxiety disorder Sister    Cancer Sister    Depression Sister    Anxiety disorder Daughter    Depression Daughter    Anxiety disorder Son    Depression Son    Colon cancer Neg Hx      Home Medications:   Prior to Admission medications   Medication Sig Start Date End Date Taking? Authorizing Provider  acetaminophen  (TYLENOL ) 500 MG tablet Take 1,000 mg by mouth every 6 (six) hours as needed for moderate pain.    [provider]  ALPRAZolam  (XANAX ) 1 MG tablet Take 1 mg by mouth 4 (four) times daily as needed for anxiety.  09/08/15   [provider]  amLODipine  (NORVASC ) 10 MG tablet TAKE 1 TABLET(10 MG) BY MOUTH DAILY 04/24/24   Zarwolo, Gloria, FNP  betamethasone  valerate ointment (VALISONE ) 0.1 % Apply sparingly to rash once daily for one week only 05/27/24   [name], MD  Estradiol  10 MCG TABS vaginal tablet Place  1 tablet in vagina at bedtime for 2 weeks then 2 x weekly 05/21/24   Signa Nest A, NP  fluticasone  (FLONASE ) 50 MCG/ACT nasal spray Place 2 sprays into both nostrils daily. 03/31/24   Zarwolo, Gloria, FNP  hydrOXYzine  (VISTARIL ) 25 MG capsule Take 1 capsule (25 mg total) by mouth every 8 (eight) hours as needed. 05/27/24   [name], MD  imipramine  (TOFRANIL ) 50 MG tablet Take 50 mg by mouth at bedtime.    [provider]  metoprolol  tartrate (LOPRESSOR ) 25 MG tablet Take 1 tablet (25 mg total) by mouth 2 (two) times daily. 10/01/23 05/27/24  Zarwolo, Gloria, FNP  predniSONE  (DELTASONE ) 5 MG tablet Take 1 tablet (5 mg total) by mouth daily with breakfast for 7 days. 05/27/24 06/03/24  [name], MD  rOPINIRole  (REQUIP ) 0.5 MG tablet Take 1 tablet (0.5 mg total) by mouth at bedtime. 10/01/23   Zarwolo, Gloria, FNP  simvastatin  (ZOCOR ) 20 MG tablet TAKE 1 TABLET(20 MG) BY MOUTH DAILY IN THE AFTERNOON 05/20/24   Zarwolo, Gloria, FNP  Vitamin D , Ergocalciferol , (DRISDOL ) 1.25 MG (50000 UNIT) CAPS capsule Take 1 capsule (50,000 Units total) by mouth every 7 (seven) days. 05/30/23   Zarwolo, Gloria, FNP     Allergies:     Allergies  Allergen Reactions   Doxycycline Nausea Only    Respiratory Distress, nausea, loss of appetite   Penicillins Hives and Nausea Only   Ciprofloxacin  Other (See Comments)    Dry mouth, skin tingling, panicky, stomach pain, anxious and nervous    Estrace  [Estradiol ]     Vaginal discharge    Mirtazapine     Made BP drop and felt faint    Nitrofurantoin  Other (See Comments)    Chills and hot flashes    Polymox [Amoxicillin] Hives   Progesterone      Burning / vaginal discharge      Physical Exam:   Vitals  Blood pressure 121/69, pulse 74, temperature 98.5 F (36.9 C), temperature source Oral, resp. rate 16, height 5' 8.5 (1.74 m), weight 68 kg, SpO2 100%.   1. General  Well Developed female, laying in bed, no apparent distress  2.  Normal affect and insight, Not Suicidal or Homicidal, Awake Alert, Oriented X 3.  3. No F.N deficits, ALL C.Nerves Intact, Strength 5/5 all 4 extremities, Sensation intact all 4 extremities, Plantars down going.  4. Ears and Eyes appear Normal, Conjunctivae clear, PERRLA. Moist Oral Mucosa.  5. Supple Neck, No JVD, No cervical lymphadenopathy appriciated, No Carotid Bruits.  6. Symmetrical Chest wall movement, Good air movement bilaterally, CTAB.  7. RRR, No Gallops, Rubs or Murmurs, No Parasternal Heave.  8. Positive Bowel Sounds, Abdomen Soft, mild left flank tenderness to palpation  9.  No Cyanosis, Normal Skin Turgor, he is having generalized a small macular rash  10. Good muscle tone,  joints appear normal , no effusions, Normal ROM.     Data Review:    CBC Recent Labs  Lab 05/30/24 1115  WBC 15.0*  HGB 14.0  HCT 43.2  PLT 320  MCV 89.6  MCH 29.0  MCHC 32.4  RDW 12.9   ------------------------------------------------------------------------------------------------------------------  Chemistries  Recent Labs  Lab 05/30/24 1115  NA 139  K 3.9  CL 102  CO2 24  GLUCOSE 109*  BUN 15  CREATININE 1.19*  CALCIUM  9.3  AST 20  ALT 21  ALKPHOS 79  BILITOT 0.7   ------------------------------------------------------------------------------------------------------------------ estimated creatinine clearance is 50.4 mL/min (A) (by C-G formula based on SCr of 1.19 mg/dL (H)). ------------------------------------------------------------------------------------------------------------------ No results for input(s): TSH, T4TOTAL, T3FREE, THYROIDAB in the last 72 hours.  Invalid input(s): FREET3  Coagulation profile No results for input(s): INR, PROTIME in the last 168 hours. ------------------------------------------------------------------------------------------------------------------- No results for input(s): DDIMER in the last 72  hours. -------------------------------------------------------------------------------------------------------------------  Cardiac Enzymes No results for input(s): CKMB, TROPONINI, MYOGLOBIN in the last 168 hours.  Invalid input(s): CK ------------------------------------------------------------------------------------------------------------------ No results found for: BNP   ---------------------------------------------------------------------------------------------------------------  Urinalysis    Component Value Date/Time   COLORURINE YELLOW 05/30/2024 1002   APPEARANCEUR TURBID (A) 05/30/2024 1002   APPEARANCEUR Clear 04/30/2024 0952   LABSPEC 1.012 05/30/2024 1002   PHURINE 7.0 05/30/2024 1002   GLUCOSEU NEGATIVE 05/30/2024 1002   HGBUR SMALL (A) 05/30/2024 1002   BILIRUBINUR NEGATIVE 05/30/2024 1002   BILIRUBINUR Negative 04/30/2024 0952   KETONESUR NEGATIVE 05/30/2024 1002   PROTEINUR 100 (A) 05/30/2024 1002   UROBILINOGEN 0.2 11/28/2022 1420   NITRITE NEGATIVE 05/30/2024 1002   LEUKOCYTESUR MODERATE (A) 05/30/2024 1002    ----------------------------------------------------------------------------------------------------------------   Imaging Results:    CT ABDOMEN PELVIS W CONTRAST Result Date: 05/30/2024 CLINICAL DATA:  Left lower quadrant abdominal pain. EXAM: CT ABDOMEN AND PELVIS WITH CONTRAST TECHNIQUE: Multidetector CT imaging of the abdomen and pelvis was performed using the standard protocol following bolus administration of intravenous contrast. RADIATION DOSE REDUCTION: This exam was performed according to the departmental dose-optimization program which includes automated exposure control, adjustment of the mA and/or kV according to patient size and/or use of iterative reconstruction technique. CONTRAST:  OMNIPAQUE  IOHEXOL  300 MG/ML  SOLN COMPARISON:  CT abdomen pelvis without contrast 01/15/2022 FINDINGS: Lower chest: No acute abnormality.  Hepatobiliary: Normal appearance of liver. The common bile duct is dilated up to approximately 11 mm and the gallbladder mildly distended without visible calculi. No definite calculi seen within the common bile duct. Recommend correlation with liver function tests. Pancreas: Unremarkable. No pancreatic ductal dilatation or surrounding inflammatory changes. Spleen: Normal in size without focal abnormality. Adrenals/Urinary Tract: Significant left-sided hydronephrosis and hydroureter with a dilated ureter extending into the pelvis to the  level of a 2 mm calculus in the distal ureter. There may be an additional tiny 1 mm calculus higher in the ureter within the pelvis. The right kidney is normal. No adrenal masses. The bladder is unremarkable. Stomach/Bowel: Bowel shows no evidence of obstruction, ileus, inflammation or lesion. The appendix is not discretely visualized. No free intraperitoneal air. Vascular/Lymphatic: No significant vascular findings are present. No enlarged abdominal or pelvic lymph nodes. Reproductive: Uterus and bilateral adnexa are unremarkable. Other: No abdominal wall hernia or abnormality. No abdominopelvic ascites. Musculoskeletal: No acute or significant osseous findings. IMPRESSION: 1. Significant left-sided hydronephrosis and hydroureter with a dilated ureter extending into the pelvis to the level of a 2 mm calculus in the distal ureter. There may be an additional tiny 1 mm calculus higher in the ureter within the pelvis. 2. Dilated common bile duct up to 11 mm and mildly distended gallbladder without visible calculi. Recommend correlation with liver function tests. Electronically Signed   By: Marcey Moan M.D.   On: 05/30/2024 16:21       Assessment & Plan:    Principal Problem:   Hydronephrosis Active Problems:   History of hypercholesterolemia   Restless legs syndrome   Stage 3b chronic kidney disease (CKD) (HCC)   Kidney stones    Recurrent UTI  Left  hydronephrosis -Afebrile, but has leukocytosis, with positive UA, imaging significant for left hydronephrosis - Will start on Rocephin  2 g IV daily-follow on blood and urine cultures -urology consulted, recommendation to keep n.p.o. after midnight with Dr. Sherrilee to evaluate in a.m. to see if a stent is warranted or not.   hypertension - Blood pressure is soft, so we will hold amlodipine  and continue metoprolol  at half dose, \ Will add as needed hydralazine    Hyperlipidemia -Continue home statin. -Heart healthy diet discussed with patient.   anxiety/depression -Continue with home medication  Sarcoidosis - She is following at [facility], appointment scheduled with dermatology next week due to sarcoidosis causing her rash.  Vape smoking - She was counseled  DVT Prophylaxis  Lovenox   AM Labs Ordered, also please review Full Orders  Family Communication: Admission, patients condition and plan of care including tests being ordered have been discussed with the patient who indicate understanding and agree with the plan and Code Status.  Code Status full code  Likely DC to home   Consults called: Dr. Sherrilee consulted by ED, he will see patient here in any pain and a.m.  Admission status: Inpatient  Time spent in minutes : 70 minutes   Brayton Lye M.D on 05/30/2024 at 5:11 PM   [facility] - Office  [phone]

## 2024-05-31 DIAGNOSIS — N200 Calculus of kidney: Secondary | ICD-10-CM

## 2024-05-31 DIAGNOSIS — N179 Acute kidney failure, unspecified: Secondary | ICD-10-CM | POA: Diagnosis not present

## 2024-05-31 DIAGNOSIS — N1330 Unspecified hydronephrosis: Secondary | ICD-10-CM | POA: Diagnosis not present

## 2024-05-31 DIAGNOSIS — N390 Urinary tract infection, site not specified: Secondary | ICD-10-CM | POA: Diagnosis not present

## 2024-05-31 LAB — CBC
HCT: 36.9 % (ref 36.0–46.0)
Hemoglobin: 12 g/dL (ref 12.0–15.0)
MCH: 28.6 pg (ref 26.0–34.0)
MCHC: 32.5 g/dL (ref 30.0–36.0)
MCV: 88.1 fL (ref 80.0–100.0)
Platelets: 289 K/uL (ref 150–400)
RBC: 4.19 MIL/uL (ref 3.87–5.11)
RDW: 13.2 % (ref 11.5–15.5)
WBC: 10.4 K/uL (ref 4.0–10.5)
nRBC: 0 % (ref 0.0–0.2)

## 2024-05-31 LAB — BASIC METABOLIC PANEL WITH GFR
Anion gap: 10 (ref 5–15)
BUN: 15 mg/dL (ref 8–23)
CO2: 25 mmol/L (ref 22–32)
Calcium: 9.2 mg/dL (ref 8.9–10.3)
Chloride: 106 mmol/L (ref 98–111)
Creatinine, Ser: 0.96 mg/dL (ref 0.44–1.00)
GFR, Estimated: >60 mL/min (ref >60)
Glucose, Bld: 86 mg/dL (ref 70–99)
Potassium: 3.8 mmol/L (ref 3.5–5.1)
Sodium: 141 mmol/L (ref 135–145)

## 2024-05-31 LAB — HIV ANTIBODY (ROUTINE TESTING W REFLEX): HIV Screen 4th Generation wRfx: NONREACTIVE

## 2024-05-31 MED ORDER — PREDNISONE 5 MG PO TABS
5.0000 mg | ORAL_TABLET | Freq: Every day | ORAL | Status: DC
  Administered 2024-05-31 – 2024-06-01 (×2): 5 mg via ORAL
  Filled 2024-05-31 (×2): qty 1

## 2024-05-31 MED ORDER — IMIPRAMINE HCL 25 MG PO TABS
50.0000 mg | ORAL_TABLET | Freq: Every day | ORAL | Status: DC
  Administered 2024-05-31: 50 mg via ORAL
  Filled 2024-05-31: qty 2

## 2024-05-31 NOTE — Plan of Care (Signed)

## 2024-05-31 NOTE — Plan of Care (Signed)
 Plan of care is reviewed. Pt has been progressing. Stable hemodynamically, afebrile, no acute distress noted. Pain is well tolerated. We will monitor.  Problem: Clinical Measurements: Goal: Ability to maintain clinical measurements within normal limits will improve Outcome: Progressing Goal: Will remain free from infection Outcome: Progressing Goal: Diagnostic test results will improve Outcome: Progressing Goal: Respiratory complications will improve Outcome: Progressing Goal: Cardiovascular complication will be avoided Outcome: Progressing   Problem: Coping: Goal: Level of anxiety will decrease Outcome: Progressing   Problem: Elimination: Goal: Will not experience complications related to bowel motility Outcome: Progressing Goal: Will not experience complications related to urinary retention Outcome: Progressing   Problem: Pain Managment: Goal: General experience of comfort will improve and/or be controlled Outcome: Progressing   Problem: Safety: Goal: Ability to remain free from injury will improve Outcome: Progressing   Wendi Dash, RN

## 2024-05-31 NOTE — Progress Notes (Signed)
  Progress Note   Patient: Leah Padilla FMW:[id number] DOB: [birth year] DOA: 05/30/2024     1 DOS: the patient was seen and examined on 05/31/2024   Brief hospital admission narrative: As per H&P written by Dr. Sherlon on 05/30/2024 Leah Padilla  is a 63 y.o. female, with past medical history of hypertension, hyperlipidemia, Bell's palsy, depression, and with history of lymphadenopathy, with diagnosis of sarcoidosis, where she is following at [facility] regarding that. Patient presents today secondary to complaints of abdominal pain, reports left lower quadrant abdominal pain for last 2 days, radiating to left groin area, denies fever, chills, no nausea, or vomiting, which prompted her to come to ED, with history of recurrent UTI.  Was recently urine culture growing Proteus 04/30/2024 pansensitive. -In ED her workup significant for positive UA, and CT abdomen pelvis significant for left hydronephrosis, so [facility] hospitalist consulted to admit.  Assessment and plan 1-regarding UTI/left hydronephrosis - WBC trending down - No fever - Continue IV antibiotics - Follow urine culture results - Maintain adequate hydration - Urology service has been consulted and will follow further recommendations.  2-hypertension - Blood pressure is stable but soft - Continue holding amlodipine  and continue Just half dose of metoprolol  - Maintain adequate hydration - Follow vital signs.  3-hyperlipidemia - Continue statin.  4-depression/anxiety - Continue home medications.  5-sarcoidosis/sarcoid dermatitis - Complete daily steroids as recently prescribed - Continue outpatient follow-up with dermatology as previously scheduled - Patient actively follow at the [facility].  6-vape smoking - Cessation counseling provided  7-acute kidney injury - In the setting of prerenal azotemia and UTI - Maintain adequate hydration - Minimize nephrotoxic agents and avoid hypotension - Renal function adequately improving;  continue to follow trend.  Subjective:  No fever, no chest pain, no nausea or vomiting.  Reports improvement in her left flank pain.  Patient is still complaining of increased frequency.  Physical Exam: Vitals:   05/30/24 2059 05/31/24 0129 05/31/24 0533 05/31/24 1345  BP: (!) 106/58 104/72 97/63 (!) 117/57  Pulse: 80 71 84 80  Resp: 16 16 16 18   Temp: 99.2 F (37.3 C) 98.6 F (37 C) 98.4 F (36.9 C) 98.4 F (36.9 C)  TempSrc: Oral Oral Oral Oral  SpO2: 98% 99% 100% 97%  Weight:      Height:       General exam: Alert, awake, oriented x 3 Respiratory system: Clear to auscultation. Respiratory effort normal. Cardiovascular system:RRR. No murmurs, rubs, gallops. Gastrointestinal system: Abdomen is nondistended, soft and nontender. No organomegaly or masses felt. Normal bowel sounds heard. Central nervous system: Alert and oriented. No focal neurological deficits. Extremities: No C/C/E, +pedal pulses Skin: No petechiae.  Sarcoid dermatitis appreciated on her legs. Psychiatry: Judgement and insight appear normal. Mood & affect appropriate.    Data Reviewed: CBC: WBC 10.4, hemoglobin 12.0 and platelet count 289K Basic metabolic panel: Sodium 141, potassium 3.8, chloride 106, bicarb 25, BUN 15, creatinine 0.96 and GFR >60  Family Communication: No family at bedside.  Disposition: Status is: Inpatient Remains inpatient appropriate because: Continue IV therapy.  Anticipating discharge by home once medically stable.  Time spent: 50 minutes  Author: Eric Nunnery, MD 05/31/2024 6:32 PM  For on call review [email or web address].

## 2024-05-31 NOTE — Plan of Care (Signed)
   Problem: Education: Goal: Knowledge of General Education information will improve Description Including pain rating scale, medication(s)/side effects and non-pharmacologic comfort measures Outcome: Progressing   Problem: Health Behavior/Discharge Planning: Goal: Ability to manage health-related needs will improve Outcome: Progressing

## 2024-06-01 DIAGNOSIS — I10 Essential (primary) hypertension: Secondary | ICD-10-CM

## 2024-06-01 DIAGNOSIS — N201 Calculus of ureter: Secondary | ICD-10-CM

## 2024-06-01 DIAGNOSIS — N1330 Unspecified hydronephrosis: Secondary | ICD-10-CM | POA: Diagnosis not present

## 2024-06-01 DIAGNOSIS — Z8639 Personal history of other endocrine, nutritional and metabolic disease: Secondary | ICD-10-CM

## 2024-06-01 DIAGNOSIS — N200 Calculus of kidney: Secondary | ICD-10-CM | POA: Diagnosis not present

## 2024-06-01 DIAGNOSIS — N390 Urinary tract infection, site not specified: Secondary | ICD-10-CM | POA: Diagnosis not present

## 2024-06-01 DIAGNOSIS — N179 Acute kidney failure, unspecified: Secondary | ICD-10-CM | POA: Diagnosis not present

## 2024-06-01 LAB — URINE CULTURE: Culture: >=100000 — AB

## 2024-06-01 MED ORDER — OXYCODONE-ACETAMINOPHEN 5-325 MG PO TABS: 1.0000 | ORAL_TABLET | Freq: Four times a day (QID) | ORAL | 0 refills | Status: DC | PRN

## 2024-06-01 MED ORDER — PREDNISONE 5 MG PO TABS: 5.0000 mg | ORAL_TABLET | Freq: Every day | ORAL | Status: AC

## 2024-06-01 MED ORDER — ONDANSETRON 8 MG PO TBDP: 8.0000 mg | ORAL_TABLET | Freq: Three times a day (TID) | ORAL | 0 refills | Status: DC | PRN

## 2024-06-01 MED ORDER — SULFAMETHOXAZOLE-TRIMETHOPRIM 800-160 MG PO TABS: 1.0000 | ORAL_TABLET | Freq: Every day | ORAL | 0 refills | Status: AC

## 2024-06-01 MED ORDER — TAMSULOSIN HCL 0.4 MG PO CAPS: 0.4000 mg | ORAL_CAPSULE | Freq: Every day | ORAL | 0 refills | Status: DC

## 2024-06-01 MED ORDER — ONDANSETRON HCL 4 MG PO TABS: 4.0000 mg | ORAL_TABLET | Freq: Every day | ORAL | 1 refills | Status: DC | PRN

## 2024-06-01 MED ORDER — OXYCODONE HCL 5 MG PO TABS
5.0000 mg | ORAL_TABLET | ORAL | 0 refills | Status: AC | PRN
Start: 2024-06-01 — End: 2024-06-06

## 2024-06-01 MED ORDER — AMLODIPINE BESYLATE 10 MG PO TABS: 5.0000 mg | ORAL_TABLET | Freq: Every day | ORAL | Status: DC

## 2024-06-01 MED ORDER — OXYCODONE-ACETAMINOPHEN 5-325 MG PO TABS: 1.0000 | ORAL_TABLET | ORAL | 0 refills | Status: DC | PRN

## 2024-06-01 NOTE — Discharge Summary (Signed)
 Physician Discharge Summary   Patient: Leah Padilla MRN: [id number] DOB: 03-Feb-1961  Admit date:     05/30/2024  Discharge date: 06/01/24  Discharge Physician: Eric Nunnery   PCP: Zarwolo, Gloria, FNP   Recommendations at discharge:  Repeat basic metabolic panel to follow electrolytes and renal function Reassess blood pressure and adjust antihypertensive regimen as needed Make sure patient follow-up with urology service as instructed.  Discharge Diagnoses: Principal Problem:   Hydronephrosis Active Problems:   AKI (acute kidney injury) (HCC)   Urinary tract infection without hematuria   History of hypercholesterolemia   Restless legs syndrome   Kidney stones  Brief hospital admission narrative: As per H&P written by Dr. Sherlon on 05/30/2024 Leah Padilla  is a 63 y.o. female, with past medical history of hypertension, hyperlipidemia, Bell's palsy, depression, and with history of lymphadenopathy, with diagnosis of sarcoidosis, where she is following at [facility] regarding that. Patient presents today secondary to complaints of abdominal pain, reports left lower quadrant abdominal pain for last 2 days, radiating to left groin area, denies fever, chills, no nausea, or vomiting, which prompted her to come to ED, with history of recurrent UTI.  Was recently urine culture growing Proteus 04/30/2024 pansensitive. -In ED her workup significant for positive UA, and CT abdomen pelvis significant for left hydronephrosis, so [facility] hospitalist consulted to admit.    Assessment and Plan: 1-Proteus mirabilis UTI/left hydronephrosis - WBC trending down, patient afebrile and reporting no nausea or vomiting. - After following recommendations by urology service patient has been discharged home on Flomax , as needed antiemetics, daily Bactrim  for 1 week and outpatient follow-up with urology (as an outpatient) on 06/04/2024. - Patient advised to maintain adequate hydration. - No dysuria at discharge.    2-hypertension - Blood pressure is stable but soft - Advised to continue adjusted antihypertensive agents and to follow-up with PCP to minimize the chances of hypotension. - Patient instructed to maintain adequate hydration and to follow heart healthy/low-sodium diet.   3-hyperlipidemia - Continue statin.   4-depression/anxiety - Continue home medications.   5-sarcoidosis/sarcoid dermatitis - Complete daily steroids as recently prescribed - Continue outpatient follow-up with dermatology as previously scheduled - Patient [facility] follow at the [facility].   6-vape smoking - Cessation counseling provided   7-acute kidney injury - In the setting of prerenal azotemia and UTI - Improved and within normal limits at discharge - Patient advised to maintain adequate hydration and to complete treatment as instructed - Repeat basic metabolic panel at follow-up visit. - Continue minimizing nephrotoxic agents and avoid the use of contrast and hypotension.  Consultants: Urology service Procedures performed: See below for x-ray reports. Disposition: Home Diet recommendation: Heart healthy/low-sodium diet.  DISCHARGE MEDICATION: Allergies as of 06/01/2024       Reactions   Doxycycline Nausea Only   Respiratory Distress, nausea, loss of appetite   Penicillins Hives, Nausea Only   Ciprofloxacin  Other (See Comments)   Dry mouth, skin tingling, panicky, stomach pain, anxious and nervous   Estrace  [estradiol ]    Vaginal discharge    Mirtazapine    Made BP drop and felt faint    Nitrofurantoin  Other (See Comments)   Chills and hot flashes    Polymox [amoxicillin] Hives   Progesterone     Burning / vaginal discharge         Medication List     TAKE these medications    acetaminophen  500 MG tablet Commonly known as: TYLENOL  Take 1,000 mg by mouth every 6 (six) hours as  needed for moderate pain.   ALPRAZolam  1 MG tablet Commonly known as: XANAX  Take 1 mg by mouth 4 (four) times daily  as needed for anxiety.   amLODipine  10 MG tablet Commonly known as: NORVASC  Take 0.5 tablets (5 mg total) by mouth daily. TAKE 1 TABLET(10 MG) BY MOUTH DAILY What changed: See the new instructions.   betamethasone  valerate ointment 0.1 % Commonly known as: VALISONE  Apply sparingly to rash once daily for one week only What changed:  how much to take how to take this when to take this   Estradiol  10 MCG Tabs vaginal tablet Place 1 tablet in vagina at bedtime for 2 weeks then 2 x weekly What changed:  how much to take how to take this when to take this   hydrOXYzine  25 MG capsule Commonly known as: VISTARIL  Take 1 capsule (25 mg total) by mouth every 8 (eight) hours as needed.   imipramine  50 MG tablet Commonly known as: TOFRANIL  Take 50 mg by mouth at bedtime.   metoprolol  tartrate 25 MG tablet Commonly known as: LOPRESSOR  Take 1 tablet (25 mg total) by mouth 2 (two) times daily.   ondansetron  4 MG tablet Commonly known as: Zofran  Take 1 tablet (4 mg total) by mouth daily as needed for nausea or vomiting.   ondansetron  8 MG disintegrating tablet Commonly known as: ZOFRAN -ODT Take 1 tablet (8 mg total) by mouth every 8 (eight) hours as needed.   oxyCODONE -acetaminophen  5-325 MG tablet Commonly known as: Percocet Take 1 tablet by mouth every 6 (six) hours as needed for severe pain (pain score 7-10).   predniSONE  5 MG tablet Commonly known as: DELTASONE  Take 1 tablet (5 mg total) by mouth daily with breakfast for 5 days.   simvastatin  20 MG tablet Commonly known as: ZOCOR  TAKE 1 TABLET(20 MG) BY MOUTH DAILY IN THE AFTERNOON What changed: See the new instructions.   sulfamethoxazole -trimethoprim  800-160 MG tablet Commonly known as: Bactrim  DS Take 1 tablet by mouth daily for 7 days.   tamsulosin  0.4 MG Caps capsule Commonly known as: Flomax  Take 1 capsule (0.4 mg total) by mouth daily after supper.   Vitamin D  (Ergocalciferol ) 1.25 MG (50000 UNIT) Caps  capsule Commonly known as: DRISDOL  Take 1 capsule (50,000 Units total) by mouth every 7 (seven) days.        Follow-up Information     McKenzie, Belvie CROME, MD. Call on 06/04/2024.   Specialty: Urology Contact information: [address] [place] [address] [phone]         Zarwolo, Gloria, FNP. Schedule an appointment as soon as possible for a visit in 2 week(s).   Specialty: Family Medicine Contact information: [address] [place] [address] [phone]                Discharge Exam: Leah Padilla   05/30/24 0942 05/30/24 1752  Weight: 68 kg 68.8 kg   General exam: Alert, awake, oriented x 3 Respiratory system: Clear to auscultation. Respiratory effort normal. Cardiovascular system:RRR. No murmurs, rubs, gallops. Gastrointestinal system: Abdomen is nondistended, soft and nontender. No organomegaly or masses felt. Normal bowel sounds heard. Central nervous system: Alert and oriented. No focal neurological deficits. Extremities: No C/C/E, +pedal pulses Skin: No rashes, lesions or ulcers Psychiatry: Judgement and insight appear normal. Mood & affect appropriate.    Condition at discharge: Stable and improved.  The results of significant diagnostics from this hospitalization (including imaging, microbiology, ancillary and laboratory) are listed below for  reference.   Imaging Studies: CT ABDOMEN PELVIS W CONTRAST Result Date: 05/30/2024 CLINICAL DATA:  Left lower quadrant abdominal pain. EXAM: CT ABDOMEN AND PELVIS WITH CONTRAST TECHNIQUE: Multidetector CT imaging of the abdomen and pelvis was performed using the standard protocol following bolus administration of intravenous contrast. RADIATION DOSE REDUCTION: This exam was performed according to the departmental dose-optimization program which includes automated exposure control, adjustment of the mA and/or kV according to patient size and/or use of iterative reconstruction technique.  CONTRAST:  OMNIPAQUE  IOHEXOL  300 MG/ML  SOLN COMPARISON:  CT abdomen pelvis without contrast 01/15/2022 FINDINGS: Lower chest: No acute abnormality. Hepatobiliary: Normal appearance of liver. The common bile duct is dilated up to approximately 11 mm and the gallbladder mildly distended without visible calculi. No definite calculi seen within the common bile duct. Recommend correlation with liver function tests. Pancreas: Unremarkable. No pancreatic ductal dilatation or surrounding inflammatory changes. Spleen: Normal in size without focal abnormality. Adrenals/Urinary Tract: Significant left-sided hydronephrosis and hydroureter with a dilated ureter extending into the pelvis to the level of a 2 mm calculus in the distal ureter. There may be an additional tiny 1 mm calculus higher in the ureter within the pelvis. The right kidney is normal. No adrenal masses. The bladder is unremarkable. Stomach/Bowel: Bowel shows no evidence of obstruction, ileus, inflammation or lesion. The appendix is not discretely visualized. No free intraperitoneal air. Vascular/Lymphatic: No significant vascular findings are present. No enlarged abdominal or pelvic lymph nodes. Reproductive: Uterus and bilateral adnexa are unremarkable. Other: No abdominal wall hernia or abnormality. No abdominopelvic ascites. Musculoskeletal: No acute or significant osseous findings. IMPRESSION: 1. Significant left-sided hydronephrosis and hydroureter with a dilated ureter extending into the pelvis to the level of a 2 mm calculus in the distal ureter. There may be an additional tiny 1 mm calculus higher in the ureter within the pelvis. 2. Dilated common bile duct up to 11 mm and mildly distended gallbladder without visible calculi. Recommend correlation with liver function tests. Electronically Signed   By: Marcey Moan M.D.   On: 05/30/2024 16:21    Microbiology: Results for orders placed or performed during the hospital encounter of 05/30/24   Urine Culture (for pregnant, neutropenic or urologic patients or patients with an indwelling urinary catheter)     Status: Abnormal   Collection Time: 05/30/24 10:02 AM   Specimen: Urine, Clean Catch  Result Value Ref Range Status   Specimen Description   Final    URINE, CLEAN CATCH Performed at [facility], [address] [place], [place] [address]    Special Requests   Final    NONE Performed at [facility], [address] [place], [place] [address]    Culture >=100,000 COLONIES/mL PROTEUS MIRABILIS (A)  Final   Report Status 06/01/2024 FINAL  Final   Organism ID, Bacteria PROTEUS MIRABILIS (A)  Final      Susceptibility   Proteus mirabilis - MIC*    AMPICILLIN <=2 SENSITIVE Sensitive     CEFAZOLIN (URINE) Value in next row Sensitive      4 SENSITIVEThis is a modified FDA-approved test that has been validated and its performance characteristics determined by the reporting laboratory.  This laboratory is certified under the Clinical Laboratory Improvement Amendments CLIA as qualified to perform high complexity clinical laboratory testing.    CEFEPIME Value in next row Sensitive      4 SENSITIVEThis is a modified FDA-approved test that has been validated and its performance characteristics determined by the reporting laboratory.  This laboratory is certified under the Clinical Laboratory Improvement Amendments CLIA as qualified to perform high complexity clinical laboratory testing.    ERTAPENEM Value in next row Sensitive      4 SENSITIVEThis is a modified FDA-approved test that has been validated and its performance characteristics determined by the reporting laboratory.  This laboratory is certified under the Clinical Laboratory Improvement Amendments CLIA as qualified to perform high complexity clinical laboratory testing.    CEFTRIAXONE  Value in next row Sensitive      4 SENSITIVEThis is a modified FDA-approved test that has been validated and its performance characteristics  determined by the reporting laboratory.  This laboratory is certified under the Clinical Laboratory Improvement Amendments CLIA as qualified to perform high complexity clinical laboratory testing.    CIPROFLOXACIN  Value in next row Sensitive      4 SENSITIVEThis is a modified FDA-approved test that has been validated and its performance characteristics determined by the reporting laboratory.  This laboratory is certified under the Clinical Laboratory Improvement Amendments CLIA as qualified to perform high complexity clinical laboratory testing.    GENTAMICIN Value in next row Sensitive      4 SENSITIVEThis is a modified FDA-approved test that has been validated and its performance characteristics determined by the reporting laboratory.  This laboratory is certified under the Clinical Laboratory Improvement Amendments CLIA as qualified to perform high complexity clinical laboratory testing.    NITROFURANTOIN  Value in next row Resistant      4 SENSITIVEThis is a modified FDA-approved test that has been validated and its performance characteristics determined by the reporting laboratory.  This laboratory is certified under the Clinical Laboratory Improvement Amendments CLIA as qualified to perform high complexity clinical laboratory testing.    TRIMETH /SULFA  Value in next row Sensitive      4 SENSITIVEThis is a modified FDA-approved test that has been validated and its performance characteristics determined by the reporting laboratory.  This laboratory is certified under the Clinical Laboratory Improvement Amendments CLIA as qualified to perform high complexity clinical laboratory testing.    AMPICILLIN/SULBACTAM Value in next row Sensitive      4 SENSITIVEThis is a modified FDA-approved test that has been validated and its performance characteristics determined by the reporting laboratory.  This laboratory is certified under the Clinical Laboratory Improvement Amendments CLIA as qualified to perform high  complexity clinical laboratory testing.    PIP/TAZO Value in next row Sensitive ug/mL     <=4 SENSITIVEThis is a modified FDA-approved test that has been validated and its performance characteristics determined by the reporting laboratory.  This laboratory is certified under the Clinical Laboratory Improvement Amendments CLIA as qualified to perform high complexity clinical laboratory testing.    MEROPENEM Value in next row Sensitive      <=4 SENSITIVEThis is a modified FDA-approved test that has been validated and its performance characteristics determined by the reporting laboratory.  This laboratory is certified under the Clinical Laboratory Improvement Amendments CLIA as qualified to perform high complexity clinical laboratory testing.    * >=100,000 COLONIES/mL PROTEUS MIRABILIS  Culture, blood (Routine X 2) w Reflex to ID Panel     Status: None (Preliminary result)   Collection Time: 05/30/24  6:04 PM   Specimen: Right Antecubital; Blood  Result Value Ref Range Status   Specimen Description RIGHT ANTECUBITAL  Final   Special Requests   Final    BOTTLES DRAWN AEROBIC AND ANAEROBIC Blood Culture adequate volume   Culture   Final  NO GROWTH 2 DAYS Performed at [facility], [address] [place], [place] [address]    Report Status PENDING  Incomplete  Culture, blood (Routine X 2) w Reflex to ID Panel     Status: None (Preliminary result)   Collection Time: 05/30/24  6:12 PM   Specimen: BLOOD RIGHT HAND  Result Value Ref Range Status   Specimen Description BLOOD RIGHT HAND  Final   Special Requests   Final    BOTTLES DRAWN AEROBIC AND ANAEROBIC Blood Culture adequate volume   Culture   Final    NO GROWTH 2 DAYS Performed at [facility], [address], [place], [place] [address]    Report Status PENDING  Incomplete    Labs: CBC: Recent Labs  Lab 05/30/24 1115 05/31/24 0405  WBC 15.0* 10.4  HGB 14.0 12.0  HCT 43.2 36.9  MCV 89.6 88.1  PLT 320 289   Basic Metabolic  Panel: Recent Labs  Lab 05/30/24 1115 05/31/24 0405  NA 139 141  K 3.9 3.8  CL 102 106  CO2 24 25  GLUCOSE 109* 86  BUN 15 15  CREATININE 1.19* 0.96  CALCIUM  9.3 9.2   Liver Function Tests: Recent Labs  Lab 05/30/24 1115  AST 20  ALT 21  ALKPHOS 79  BILITOT 0.7  PROT 7.5  ALBUMIN 4.0   CBG: No results for input(s): GLUCAP in the last 168 hours.  Discharge time spent: 35 minutes.  Signed: Eric Nunnery, MD [facility] 06/01/2024

## 2024-06-01 NOTE — Consult Note (Signed)
 Urology Consult  Referring physician: Dr. Ricky Reason for referral: ureteral stone, UTI  Chief Complaint: left flank pain  History of Present Illness: Leah Padilla is a 63yo with a recent history of frequent UTIs who was admitted with worsening left flank paina nd concern for UTI/pyelonephritis. She Was recently seen at [facility] for frequent UTIs and underwent renal US  which showed nonobstructive left renal calculi. In the past month for has been treated 3 times for a UTI. Here last 2 urine cultures grew proteus. Her flank pain started Thursday and then became severe Friday prompting her to go to the ER at [facility]. Ct obtained int he ER showed a 2mm left UVJ calculus with moderate left hydronephrosis. Her flank pain was sharp, intermittent, moderate and nonraditing. She had associated nausea and no vomiting. She denies any urinary urgency, frequency or dysuria. She remains on rocephin . Urine culture grwoing proteus  Past Medical History:  Diagnosis Date   Allergy    Anemia    Anxiety    Bell's palsy    Depression    Heart murmur When I was a baby out grown   High cholesterol    Hypertension    Sarcoidosis    Past Surgical History:  Procedure Laterality Date   AXILLARY LYMPH NODE BIOPSY Right 01/17/2022   Procedure: AXILLARY LYMPH NODE BIOPSY;  Surgeon: Evonnie Dorothyann LABOR, DO;  Location: [facility];  Service: General;  Laterality: Right;   BIOPSY  08/01/2022   Procedure: BIOPSY;  Surgeon: Cindie Carlin POUR, DO;  Location: [facility];  Service: Endoscopy;;   COLONOSCOPY N/A 04/17/2013   Procedure: COLONOSCOPY;  Surgeon: Claudis RAYMOND Rivet, MD;  Location: [facility];  Service: Endoscopy;  Laterality: N/A;  930   COLONOSCOPY WITH PROPOFOL  N/A 08/01/2022   Procedure: COLONOSCOPY WITH PROPOFOL ;  Surgeon: Cindie Carlin POUR, DO;  Location: [facility];  Service: Endoscopy;  Laterality: N/A;  1:30 pm   CYST EXCISION     neck    Medications: I have reviewed the patient's  current medications. Allergies:  Allergies  Allergen Reactions   Doxycycline Nausea Only    Respiratory Distress, nausea, loss of appetite   Penicillins Hives and Nausea Only   Ciprofloxacin  Other (See Comments)    Dry mouth, skin tingling, panicky, stomach pain, anxious and nervous    Estrace  [Estradiol ]     Vaginal discharge    Mirtazapine     Made BP drop and felt faint    Nitrofurantoin  Other (See Comments)    Chills and hot flashes    Polymox [Amoxicillin] Hives   Progesterone      Burning / vaginal discharge     Family History  Problem Relation Age of Onset   Multiple myeloma Mother    Cancer Mother    Stroke Father    Diabetes Father    Heart disease Father    Breast cancer Sister    Stroke Other    Diabetes Other    Anxiety disorder Sister    Cancer Sister    Depression Sister    Anxiety disorder Daughter    Depression Daughter    Anxiety disorder Son    Depression Son    Colon cancer Neg Hx    Social History:  reports that she quit smoking about 7 years ago. Her smoking use included cigarettes. She started smoking about 20 years ago. She has a 6.5 pack-year smoking history. She has never used smokeless tobacco. She reports that she does not currently  use alcohol. She reports current drug use. Frequency: 3.00 times per week. Drug: Marijuana.  Review of Systems  Genitourinary:  Positive for flank pain.  All other systems reviewed and are negative.   Physical Exam:  Vital signs in last 24 hours: Temp:  [98.4 F (36.9 C)-98.6 F (37 C)] 98.4 F (36.9 C) (08/31 0456) Pulse Rate:  [82-89] 82 (08/31 0456) Resp:  [18] 18 (08/31 0456) BP: (104-108)/(59-68) 104/59 (08/31 0456) SpO2:  [96 %-98 %] 96 % (08/31 0456) Physical Exam Vitals and nursing note reviewed.  Constitutional:      Appearance: She is well-developed.  HENT:     Head: Normocephalic and atraumatic.  Cardiovascular:     Rate and Rhythm: Normal rate and regular rhythm.  Pulmonary:      Effort: Pulmonary effort is normal.     Breath sounds: Normal breath sounds.  Abdominal:     General: Abdomen is flat. There is no distension.  Skin:    General: Skin is warm and dry.  Neurological:     General: No focal deficit present.     Mental Status: She is alert and oriented to person, place, and time.  Psychiatric:        Mood and Affect: Mood normal.        Behavior: Behavior normal.     Laboratory Data:  Results for orders placed or performed during the hospital encounter of 05/30/24 (from the past 72 hours)  Urinalysis, Routine w reflex microscopic -Urine, Clean Catch     Status: Abnormal   Collection Time: 05/30/24 10:02 AM  Result Value Ref Range   Color, Urine YELLOW YELLOW   APPearance TURBID (A) CLEAR   Specific Gravity, Urine 1.012 1.005 - 1.030   pH 7.0 5.0 - 8.0   Glucose, UA NEGATIVE NEGATIVE mg/dL   Hgb urine dipstick SMALL (A) NEGATIVE   Bilirubin Urine NEGATIVE NEGATIVE   Ketones, ur NEGATIVE NEGATIVE mg/dL   Protein, ur 899 (A) NEGATIVE mg/dL   Nitrite NEGATIVE NEGATIVE   Leukocytes,Ua MODERATE (A) NEGATIVE   RBC / HPF 0-5 0 - 5 RBC/hpf   WBC, UA >50 0 - 5 WBC/hpf   Bacteria, UA FEW (A) NONE SEEN   Squamous Epithelial / HPF 0-5 0 - 5 /HPF   WBC Clumps PRESENT     Comment: Performed at [facility], [address], [place], [place] [address]  Urine Culture (for pregnant, neutropenic or urologic patients or patients with an indwelling urinary catheter)     Status: Abnormal   Collection Time: 05/30/24 10:02 AM   Specimen: Urine, Clean Catch  Result Value Ref Range   Specimen Description      URINE, CLEAN CATCH Performed at [facility], [address], [place], [place] [address]    Special Requests      NONE Performed at [facility], [address], [place], [place] [address]    Culture >=100,000 COLONIES/mL PROTEUS MIRABILIS (A)    Report Status 06/01/2024 FINAL    Organism ID, Bacteria PROTEUS MIRABILIS (A)       Susceptibility   Proteus  mirabilis - MIC*    AMPICILLIN <=2 SENSITIVE Sensitive     CEFAZOLIN (URINE) Value in next row Sensitive      4 SENSITIVEThis is a modified FDA-approved test that has been validated and its performance characteristics determined by the reporting laboratory.  This laboratory is certified under the Clinical Laboratory Improvement Amendments CLIA as qualified to perform high complexity clinical laboratory testing.  CEFEPIME Value in next row Sensitive      4 SENSITIVEThis is a modified FDA-approved test that has been validated and its performance characteristics determined by the reporting laboratory.  This laboratory is certified under the Clinical Laboratory Improvement Amendments CLIA as qualified to perform high complexity clinical laboratory testing.    ERTAPENEM Value in next row Sensitive      4 SENSITIVEThis is a modified FDA-approved test that has been validated and its performance characteristics determined by the reporting laboratory.  This laboratory is certified under the Clinical Laboratory Improvement Amendments CLIA as qualified to perform high complexity clinical laboratory testing.    CEFTRIAXONE  Value in next row Sensitive      4 SENSITIVEThis is a modified FDA-approved test that has been validated and its performance characteristics determined by the reporting laboratory.  This laboratory is certified under the Clinical Laboratory Improvement Amendments CLIA as qualified to perform high complexity clinical laboratory testing.    CIPROFLOXACIN  Value in next row Sensitive      4 SENSITIVEThis is a modified FDA-approved test that has been validated and its performance characteristics determined by the reporting laboratory.  This laboratory is certified under the Clinical Laboratory Improvement Amendments CLIA as qualified to perform high complexity clinical laboratory testing.    GENTAMICIN Value in next row Sensitive      4 SENSITIVEThis is a modified FDA-approved test that has been  validated and its performance characteristics determined by the reporting laboratory.  This laboratory is certified under the Clinical Laboratory Improvement Amendments CLIA as qualified to perform high complexity clinical laboratory testing.    NITROFURANTOIN  Value in next row Resistant      4 SENSITIVEThis is a modified FDA-approved test that has been validated and its performance characteristics determined by the reporting laboratory.  This laboratory is certified under the Clinical Laboratory Improvement Amendments CLIA as qualified to perform high complexity clinical laboratory testing.    TRIMETH /SULFA  Value in next row Sensitive      4 SENSITIVEThis is a modified FDA-approved test that has been validated and its performance characteristics determined by the reporting laboratory.  This laboratory is certified under the Clinical Laboratory Improvement Amendments CLIA as qualified to perform high complexity clinical laboratory testing.    AMPICILLIN/SULBACTAM Value in next row Sensitive      4 SENSITIVEThis is a modified FDA-approved test that has been validated and its performance characteristics determined by the reporting laboratory.  This laboratory is certified under the Clinical Laboratory Improvement Amendments CLIA as qualified to perform high complexity clinical laboratory testing.    PIP/TAZO Value in next row Sensitive ug/mL     <=4 SENSITIVEThis is a modified FDA-approved test that has been validated and its performance characteristics determined by the reporting laboratory.  This laboratory is certified under the Clinical Laboratory Improvement Amendments CLIA as qualified to perform high complexity clinical laboratory testing.    MEROPENEM Value in next row Sensitive      <=4 SENSITIVEThis is a modified FDA-approved test that has been validated and its performance characteristics determined by the reporting laboratory.  This laboratory is certified under the Clinical Laboratory  Improvement Amendments CLIA as qualified to perform high complexity clinical laboratory testing.    * >=100,000 COLONIES/mL PROTEUS MIRABILIS  Lipase, blood     Status: None   Collection Time: 05/30/24 11:15 AM  Result Value Ref Range   Lipase 31 11 - 51 U/L    Comment: Performed at [facility], [address], [place], [place]  [address]  Comprehensive metabolic panel     Status: Abnormal   Collection Time: 05/30/24 11:15 AM  Result Value Ref Range   Sodium 139 135 - 145 mmol/L   Potassium 3.9 3.5 - 5.1 mmol/L   Chloride 102 98 - 111 mmol/L   CO2 24 22 - 32 mmol/L   Glucose, Bld 109 (H) 70 - 99 mg/dL    Comment: Glucose reference range applies only to samples taken after fasting for at least 8 hours.   BUN 15 8 - 23 mg/dL   Creatinine, Ser 8.80 (H) 0.44 - 1.00 mg/dL   Calcium  9.3 8.9 - 10.3 mg/dL   Total Protein 7.5 6.5 - 8.1 g/dL   Albumin 4.0 3.5 - 5.0 g/dL   AST 20 15 - 41 U/L   ALT 21 0 - 44 U/L   Alkaline Phosphatase 79 38 - 126 U/L   Total Bilirubin 0.7 0.0 - 1.2 mg/dL   GFR, Estimated 52 (L) >60 mL/min    Comment: (NOTE) Calculated using the CKD-EPI Creatinine Equation (2021)    Anion gap 13 5 - 15    Comment: Performed at [facility], [address] [place], [place] [address]  CBC     Status: Abnormal   Collection Time: 05/30/24 11:15 AM  Result Value Ref Range   WBC 15.0 (H) 4.0 - 10.5 K/uL   RBC 4.82 3.87 - 5.11 MIL/uL   Hemoglobin 14.0 12.0 - 15.0 g/dL   HCT 56.7 63.9 - 53.9 %   MCV 89.6 80.0 - 100.0 fL   MCH 29.0 26.0 - 34.0 pg   MCHC 32.4 30.0 - 36.0 g/dL   RDW 87.0 88.4 - 84.4 %   Platelets 320 150 - 400 K/uL   nRBC 0.0 0.0 - 0.2 %    Comment: Performed at [facility], [address], [place], [place] [address]  Culture, blood (Routine X 2) w Reflex to ID Panel     Status: None (Preliminary result)   Collection Time: 05/30/24  6:04 PM   Specimen: Right Antecubital; Blood  Result Value Ref Range   Specimen Description RIGHT ANTECUBITAL    Special  Requests      BOTTLES DRAWN AEROBIC AND ANAEROBIC Blood Culture adequate volume   Culture      NO GROWTH 2 DAYS Performed at [facility], [address] [place] [address], [place], [place] [address]    Report Status PENDING   Culture, blood (Routine X 2) w Reflex to ID Panel     Status: None (Preliminary result)   Collection Time: 05/30/24  6:12 PM   Specimen: BLOOD RIGHT HAND  Result Value Ref Range   Specimen Description BLOOD RIGHT HAND    Special Requests      BOTTLES DRAWN AEROBIC AND ANAEROBIC Blood Culture adequate volume   Culture      NO GROWTH 2 DAYS Performed at [facility], [address], [place], [place] [address]    Report Status PENDING   HIV Antibody (routine testing w rflx)     Status: None   Collection Time: 05/31/24  4:05 AM  Result Value Ref Range   HIV Screen 4th Generation wRfx Non Reactive Non Reactive    Comment: Performed at [facility], [address], [place], [place] [address]  Basic metabolic panel     Status: None   Collection Time: 05/31/24  4:05 AM  Result Value Ref Range   Sodium 141 135 - 145 mmol/L   Potassium 3.8 3.5 -  5.1 mmol/L   Chloride 106 98 - 111 mmol/L   CO2 25 22 - 32 mmol/L   Glucose, Bld 86 70 - 99 mg/dL    Comment: Glucose reference range applies only to samples taken after fasting for at least 8 hours.   BUN 15 8 - 23 mg/dL   Creatinine, Ser 9.03 0.44 - 1.00 mg/dL   Calcium  9.2 8.9 - 10.3 mg/dL   GFR, Estimated >39 >39 mL/min    Comment: (NOTE) Calculated using the CKD-EPI Creatinine Equation (2021)    Anion gap 10 5 - 15    Comment: Performed at [facility], [address], [place], [place] [address]  CBC     Status: None   Collection Time: 05/31/24  4:05 AM  Result Value Ref Range   WBC 10.4 4.0 - 10.5 K/uL   RBC 4.19 3.87 - 5.11 MIL/uL   Hemoglobin 12.0 12.0 - 15.0 g/dL   HCT 63.0 63.9 - 53.9 %   MCV 88.1 80.0 - 100.0 fL   MCH 28.6 26.0 - 34.0 pg   MCHC 32.5 30.0 - 36.0 g/dL   RDW 86.7 88.4 - 84.4 %   Platelets 289 150 -  400 K/uL   nRBC 0.0 0.0 - 0.2 %    Comment: Performed at [facility], [address], [place], [place] [address]   Recent Results (from the past 240 hours)  Urine Culture (for pregnant, neutropenic or urologic patients or patients with an indwelling urinary catheter)     Status: Abnormal   Collection Time: 05/30/24 10:02 AM   Specimen: Urine, Clean Catch  Result Value Ref Range Status   Specimen Description   Final    URINE, CLEAN CATCH Performed at [facility], [address], [place], [place] [address]    Special Requests   Final    NONE Performed at [facility], [address], [place], [place] [address]    Culture >=100,000 COLONIES/mL PROTEUS MIRABILIS (A)  Final   Report Status 06/01/2024 FINAL  Final   Organism ID, Bacteria PROTEUS MIRABILIS (A)  Final      Susceptibility   Proteus mirabilis - MIC*    AMPICILLIN <=2 SENSITIVE Sensitive     CEFAZOLIN (URINE) Value in next row Sensitive      4 SENSITIVEThis is a modified FDA-approved test that has been validated and its performance characteristics determined by the reporting laboratory.  This laboratory is certified under the Clinical Laboratory Improvement Amendments CLIA as qualified to perform high complexity clinical laboratory testing.    CEFEPIME Value in next row Sensitive      4 SENSITIVEThis is a modified FDA-approved test that has been validated and its performance characteristics determined by the reporting laboratory.  This laboratory is certified under the Clinical Laboratory Improvement Amendments CLIA as qualified to perform high complexity clinical laboratory testing.    ERTAPENEM Value in next row Sensitive      4 SENSITIVEThis is a modified FDA-approved test that has been validated and its performance characteristics determined by the reporting laboratory.  This laboratory is certified under the Clinical Laboratory Improvement Amendments CLIA as qualified to perform high complexity clinical laboratory testing.     CEFTRIAXONE  Value in next row Sensitive      4 SENSITIVEThis is a modified FDA-approved test that has been validated and its performance characteristics determined by the reporting laboratory.  This laboratory is certified under the Clinical Laboratory Improvement Amendments CLIA as qualified to perform high complexity clinical laboratory testing.  CIPROFLOXACIN  Value in next row Sensitive      4 SENSITIVEThis is a modified FDA-approved test that has been validated and its performance characteristics determined by the reporting laboratory.  This laboratory is certified under the Clinical Laboratory Improvement Amendments CLIA as qualified to perform high complexity clinical laboratory testing.    GENTAMICIN Value in next row Sensitive      4 SENSITIVEThis is a modified FDA-approved test that has been validated and its performance characteristics determined by the reporting laboratory.  This laboratory is certified under the Clinical Laboratory Improvement Amendments CLIA as qualified to perform high complexity clinical laboratory testing.    NITROFURANTOIN  Value in next row Resistant      4 SENSITIVEThis is a modified FDA-approved test that has been validated and its performance characteristics determined by the reporting laboratory.  This laboratory is certified under the Clinical Laboratory Improvement Amendments CLIA as qualified to perform high complexity clinical laboratory testing.    TRIMETH /SULFA  Value in next row Sensitive      4 SENSITIVEThis is a modified FDA-approved test that has been validated and its performance characteristics determined by the reporting laboratory.  This laboratory is certified under the Clinical Laboratory Improvement Amendments CLIA as qualified to perform high complexity clinical laboratory testing.    AMPICILLIN/SULBACTAM Value in next row Sensitive      4 SENSITIVEThis is a modified FDA-approved test that has been validated and its performance characteristics  determined by the reporting laboratory.  This laboratory is certified under the Clinical Laboratory Improvement Amendments CLIA as qualified to perform high complexity clinical laboratory testing.    PIP/TAZO Value in next row Sensitive ug/mL     <=4 SENSITIVEThis is a modified FDA-approved test that has been validated and its performance characteristics determined by the reporting laboratory.  This laboratory is certified under the Clinical Laboratory Improvement Amendments CLIA as qualified to perform high complexity clinical laboratory testing.    MEROPENEM Value in next row Sensitive      <=4 SENSITIVEThis is a modified FDA-approved test that has been validated and its performance characteristics determined by the reporting laboratory.  This laboratory is certified under the Clinical Laboratory Improvement Amendments CLIA as qualified to perform high complexity clinical laboratory testing.    * >=100,000 COLONIES/mL PROTEUS MIRABILIS  Culture, blood (Routine X 2) w Reflex to ID Panel     Status: None (Preliminary result)   Collection Time: 05/30/24  6:04 PM   Specimen: Right Antecubital; Blood  Result Value Ref Range Status   Specimen Description RIGHT ANTECUBITAL  Final   Special Requests   Final    BOTTLES DRAWN AEROBIC AND ANAEROBIC Blood Culture adequate volume   Culture   Final    NO GROWTH 2 DAYS Performed at [facility], [address] [place], [place] [address]    Report Status PENDING  Incomplete  Culture, blood (Routine X 2) w Reflex to ID Panel     Status: None (Preliminary result)   Collection Time: 05/30/24  6:12 PM   Specimen: BLOOD RIGHT HAND  Result Value Ref Range Status   Specimen Description BLOOD RIGHT HAND  Final   Special Requests   Final    BOTTLES DRAWN AEROBIC AND ANAEROBIC Blood Culture adequate volume   Culture   Final    NO GROWTH 2 DAYS Performed at [facility], [address], [place], [place] [address]    Report Status PENDING  Incomplete    Creatinine: Recent Labs    05/30/24 1115 05/31/24  0405  CREATININE 1.19* 0.96   Baseline Creatinine: 0.9  Impression/Assessment:  63yo with left ureteral calculus  Plan:  -We discussed the management of kidney stones. These options include observation, ureteroscopy, shockwave lithotripsy (ESWL) and percutaneous nephrolithotomy (PCNL). We discussed which options are relevant to the patient's stone(s). We discussed the natural history of kidney stones as well as the complications of untreated stones and the impact on quality of life without treatment as well as with each of the above listed treatments. We also discussed the efficacy of each treatment in its ability to clear the stone burden. With any of these management options I discussed the signs and symptoms of infection and the need for emergent treatment should these be experienced. For each option we discussed the ability of each procedure to clear the patient of their stone burden.   For observation I described the risks which include but are not limited to silent renal damage, life-threatening infection, need for emergent surgery, failure to pass stone and pain.   For ureteroscopy I described the risks which include bleeding, infection, damage to contiguous structures, positioning injury, ureteral stricture, ureteral avulsion, ureteral injury, need for prolonged ureteral stent, inability to perform ureteroscopy, need for an interval procedure, inability to clear stone burden, stent discomfort/pain, heart attack, stroke, pulmonary embolus and the inherent risks with general anesthesia.   For shockwave lithotripsy I described the risks which include arrhythmia, kidney contusion, kidney hemorrhage, need for transfusion, pain, inability to adequately break up stone, inability to pass stone fragments, Steinstrasse, infection associated with obstructing stones, need for alternate surgical procedure, need for repeat shockwave lithotripsy, MI,  CVA, PE and the inherent risks with anesthesia/conscious sedation.   For PCNL I described the risks including positioning injury, pneumothorax, hydrothorax, need for chest tube, inability to clear stone burden, renal laceration, arterial venous fistula or malformation, need for embolization of kidney, loss of kidney or renal function, need for repeat procedure, need for prolonged nephrostomy tube, ureteral avulsion, MI, CVA, PE and the inherent risks of general anesthesia.   - The patient would like to proceed with medical expulsive therapy. Patient started on flomax  daily. She can be transitioned to PO antibiotics and can be discharged home from a urology standpoint. She will followup in my office this week with a KUB.   Leah Padilla 06/01/2024, 2:28 PM

## 2024-06-02 ENCOUNTER — Telehealth: Payer: Self-pay

## 2024-06-02 NOTE — Transitions of Care (Post Inpatient/ED Visit) (Unsigned)
   06/02/2024  Name: Leah Padilla MRN: [id number] DOB: 1961-02-12  Today's TOC FU Call Status: Today's TOC FU Call Status:: Unsuccessful Call (1st Attempt) Unsuccessful Call (1st Attempt) Date: 06/02/24  Attempted to reach the patient regarding the most recent Inpatient/ED visit.  Follow Up Plan: Additional outreach attempts will be made to reach the patient to complete the Transitions of Care (Post Inpatient/ED visit) call.   Signature Julian Lemmings, LPN [facility] Nurse Health Advisor Direct Dial [phone]

## 2024-06-03 ENCOUNTER — Telehealth: Payer: Self-pay

## 2024-06-03 NOTE — Telephone Encounter (Signed)
 Called pt to let her know that we do not have sooner appointment pt stated she was just following up on ED recommendations and okay to wait until 10/28 appt pt was asked if she was having any pain following ED visit pt stated no

## 2024-06-03 NOTE — Telephone Encounter (Signed)
 Patient calling back to find out if she can be seen this week for a kidney stone?  Please advise.  Call:  [phone] (M)

## 2024-06-03 NOTE — Transitions of Care (Post Inpatient/ED Visit) (Signed)
   06/03/2024  Name: Leah Padilla MRN: [id number] DOB: 1961/10/01  Today's TOC FU Call Status: Today's TOC FU Call Status:: Successful TOC FU Call Completed Unsuccessful Call (1st Attempt) Date: 06/02/24 [facility] FU Call Complete Date: 06/03/24 Patient's Name and Date of Birth confirmed.  Transition Care Management Follow-up Telephone Call Date of Discharge: 06/01/24 [facility] Type of Discharge: Inpatient Admission Primary Inpatient Discharge Diagnosis:: UTI How have you been since you were released from the hospital?: Better Any questions or concerns?: No  Items Reviewed: Did you receive and understand the discharge instructions provided?: Yes Medications obtained,verified, and reconciled?: Yes (Medications Reviewed) Any new allergies since your discharge?: No Dietary orders reviewed?: Yes Do you have support at home?: Yes People in Home [RPT]: child(ren), adult  Medications Reviewed Today: Medications Reviewed Today   Medications were not reviewed in this encounter     Home Care and Equipment/Supplies: Were Home Health Services Ordered?: NA Any new equipment or medical supplies ordered?: NA  Functional Questionnaire: Do you need assistance with bathing/showering or dressing?: No Do you need assistance with meal preparation?: No Do you need assistance with eating?: No Do you have difficulty maintaining continence: No Do you need assistance with getting out of bed/getting out of a chair/moving?: No Do you have difficulty managing or taking your medications?: No  Follow up appointments reviewed: PCP Follow-up appointment confirmed?: Yes Date of PCP follow-up appointment?: 06/13/24 Follow-up Provider: [facility] Follow-up appointment confirmed?: No Reason Specialist Follow-Up Not Confirmed: Patient has Specialist Provider Number and will Call for Appointment Do you need transportation to your follow-up appointment?: No Do you  understand care options if your condition(s) worsen?: Yes-patient verbalized understanding    SIGNATURE Julian Lemmings, LPN [facility] Nurse Health Advisor Direct Dial [phone]

## 2024-06-04 ENCOUNTER — Other Ambulatory Visit: Payer: Self-pay

## 2024-06-04 ENCOUNTER — Ambulatory Visit (INDEPENDENT_AMBULATORY_CARE_PROVIDER_SITE_OTHER): Admitting: Physician Assistant

## 2024-06-04 ENCOUNTER — Encounter: Payer: Self-pay | Admitting: Physician Assistant

## 2024-06-04 VITALS — BP 95/70

## 2024-06-04 DIAGNOSIS — D692 Other nonthrombocytopenic purpura: Secondary | ICD-10-CM | POA: Diagnosis not present

## 2024-06-04 DIAGNOSIS — R21 Rash and other nonspecific skin eruption: Secondary | ICD-10-CM | POA: Diagnosis not present

## 2024-06-04 DIAGNOSIS — L720 Epidermal cyst: Secondary | ICD-10-CM

## 2024-06-04 DIAGNOSIS — N200 Calculus of kidney: Secondary | ICD-10-CM

## 2024-06-04 DIAGNOSIS — W57XXXA Bitten or stung by nonvenomous insect and other nonvenomous arthropods, initial encounter: Secondary | ICD-10-CM

## 2024-06-04 LAB — CULTURE, BLOOD (ROUTINE X 2)
Culture: NO GROWTH
Culture: NO GROWTH
Special Requests: ADEQUATE
Special Requests: ADEQUATE

## 2024-06-04 NOTE — Progress Notes (Signed)
   New Patient Visit   Subjective  Leah Padilla is a 63 y.o. female NEW PATIENT who presents for the following: Rash of arms and legs x ~ 2 weeks. She is using betamethasone  daily and took a 7 day course of oral steroids. It is clearing it up. She has a history of sarcoidosis and her PCP thought this may be part of her condition.  Other concern: enlarging cyst on neck    The following portions of the chart were reviewed this encounter and updated as appropriate: medications, allergies, medical history  Review of Systems:  No other skin or systemic complaints except as noted in HPI or Assessment and Plan.  Objective  Well appearing patient in no apparent distress; mood and affect are within normal limits.   A focused examination was performed of the following areas: ENTIRE SKIN EXAM.   Relevant exam findings are noted in the Assessment and Plan.    Assessment & Plan   EPIDERMAL INCLUSION CYST Exam: Subcutaneous nodule at ant neck  Benign-appearing. Exam most consistent with an epidermal inclusion cyst. Discussed that a cyst is a benign growth that can grow over time and sometimes get irritated or inflamed. Recommend observation if it is not bothersome. Discussed option of surgical excision to remove it if it is growing, symptomatic, or other changes noted. We will refer to [facility] for excision.  Rash Exam: healing what appears to be insect bites from feet, legs, thighs and abdomen   Differential diagnosis:  Insect bite reactions -- resolving   Treatment Plan: Continue Betamethasone  cream as prescribed until clear.   Purpura - Chronic; persistent and recurrent.  Treatable, but not curable. - Violaceous macules and patches - Benign - Related to trauma, age, sun damage and/or use of blood thinners, chronic use of topical and/or oral steroids - Observe - Can use OTC arnica containing moisturizer such as Dermend Bruise Formula if desired - Call for  worsening or other concerns    EPIDERMAL CYST   Related Procedures Ambulatory referral to Plastic Surgery INSECT BITE, UNSPECIFIED SITE, INITIAL ENCOUNTER   OTHER NONTHROMBOCYTOPENIC PURPURA (HCC)    Return if symptoms worsen or fail to improve.  I, Roseline Hutchinson, CMA, am acting as scribe for Kadelyn Dimascio K, PA-C .   Documentation: I have reviewed the above documentation for accuracy and completeness, and I agree with the above.  Jamee Pacholski K, PA-C

## 2024-06-04 NOTE — Patient Instructions (Signed)

## 2024-06-05 ENCOUNTER — Ambulatory Visit (HOSPITAL_COMMUNITY)
Admission: RE | Admit: 2024-06-05 | Discharge: 2024-06-05 | Disposition: A | Discharge status: Home / Self Care | Source: Ambulatory Visit | Attending: Urology | Admitting: Urology

## 2024-06-05 ENCOUNTER — Encounter (HOSPITAL_COMMUNITY): Payer: Self-pay | Admitting: Oncology

## 2024-06-05 DIAGNOSIS — N200 Calculus of kidney: Secondary | ICD-10-CM | POA: Insufficient documentation

## 2024-06-06 ENCOUNTER — Ambulatory Visit (INDEPENDENT_AMBULATORY_CARE_PROVIDER_SITE_OTHER): Admitting: Urology

## 2024-06-06 ENCOUNTER — Encounter: Payer: Self-pay | Admitting: Urology

## 2024-06-06 VITALS — BP 89/59 | HR 80

## 2024-06-06 DIAGNOSIS — Z87442 Personal history of urinary calculi: Secondary | ICD-10-CM

## 2024-06-06 DIAGNOSIS — Z09 Encounter for follow-up examination after completed treatment for conditions other than malignant neoplasm: Secondary | ICD-10-CM | POA: Diagnosis not present

## 2024-06-06 DIAGNOSIS — Z87440 Personal history of urinary (tract) infections: Secondary | ICD-10-CM | POA: Diagnosis not present

## 2024-06-06 DIAGNOSIS — N200 Calculus of kidney: Secondary | ICD-10-CM

## 2024-06-06 LAB — URINALYSIS, ROUTINE W REFLEX MICROSCOPIC
Bilirubin, UA: NEGATIVE
Glucose, UA: NEGATIVE
Ketones, UA: NEGATIVE
Nitrite, UA: NEGATIVE
Protein,UA: NEGATIVE
Specific Gravity, UA: 1.02 (ref 1.005–1.030)
Urobilinogen, Ur: 0.2 mg/dL (ref 0.2–1.0)
pH, UA: 6.5 (ref 5.0–7.5)

## 2024-06-06 LAB — MICROSCOPIC EXAMINATION: Bacteria, UA: NONE SEEN

## 2024-06-06 NOTE — Patient Instructions (Signed)

## 2024-06-06 NOTE — Progress Notes (Signed)
 06/06/2024 10:39 AM   Leah Padilla 1961-02-01 [id number]  Referring provider: Zarwolo, Gloria, FNP [address] [place],  [place] [address]  No chief complaint on file.   HPI: Leah Padilla is a 63yo here for followup for nephrolithiasis and frequent UTI. UA today normal. KUb shows no calculi. No flank pain. She denies any worsening LUTS. NO other complaints today   PMH: Past Medical History:  Diagnosis Date   Allergy    Anemia    Anxiety    Bell's palsy    Depression    Heart murmur When I was a baby out grown   High cholesterol    Hypertension    Sarcoidosis     Surgical History: Past Surgical History:  Procedure Laterality Date   AXILLARY LYMPH NODE BIOPSY Right 01/17/2022   Procedure: AXILLARY LYMPH NODE BIOPSY;  Surgeon: Leah Dorothyann LABOR, DO;  Location: [facility] ORS;  Service: General;  Laterality: Right;   BIOPSY  08/01/2022   Procedure: BIOPSY;  Surgeon: Leah Carlin POUR, DO;  Location: [facility];  Service: Endoscopy;;   COLONOSCOPY N/A 04/17/2013   Procedure: COLONOSCOPY;  Surgeon: Leah RAYMOND Rivet, MD;  Location: [facility];  Service: Endoscopy;  Laterality: N/A;  930   COLONOSCOPY WITH PROPOFOL  N/A 08/01/2022   Procedure: COLONOSCOPY WITH PROPOFOL ;  Surgeon: Leah Carlin POUR, DO;  Location: [facility];  Service: Endoscopy;  Laterality: N/A;  1:30 pm   CYST EXCISION     neck    Home Medications:  Allergies as of 06/06/2024       Reactions   Doxycycline Nausea Only   Respiratory Distress, nausea, loss of appetite   Penicillins Hives, Nausea Only   Ciprofloxacin  Other (See Comments)   Dry mouth, skin tingling, panicky, stomach pain, anxious and nervous   Estrace  [estradiol ]    Vaginal discharge    Mirtazapine    Made BP drop and felt faint    Nitrofurantoin  Other (See Comments)   Chills and hot flashes    Polymox [amoxicillin] Hives   Progesterone     Burning / vaginal discharge         Medication List        Accurate as of  June 06, 2024 10:39 AM. If you have any questions, ask your nurse or doctor.          acetaminophen  500 MG tablet Commonly known as: TYLENOL  Take 1,000 mg by mouth every 6 (six) hours as needed for moderate pain.   ALPRAZolam  1 MG tablet Commonly known as: XANAX  Take 1 mg by mouth 4 (four) times daily as needed for anxiety.   amLODipine  10 MG tablet Commonly known as: NORVASC  Take 0.5 tablets (5 mg total) by mouth daily. TAKE 1 TABLET(10 MG) BY MOUTH DAILY   betamethasone  valerate ointment 0.1 % Commonly known as: VALISONE  Apply sparingly to rash once daily for one week only What changed:  how much to take how to take this when to take this   Estradiol  10 MCG Tabs vaginal tablet Place 1 tablet in vagina at bedtime for 2 weeks then 2 x weekly What changed:  how much to take how to take this when to take this   hydrOXYzine  25 MG capsule Commonly known as: VISTARIL  Take 1 capsule (25 mg total) by mouth every 8 (eight) hours as needed.   imipramine  50 MG tablet Commonly known as: TOFRANIL  Take 50 mg by mouth at bedtime.   metoprolol  tartrate 25 MG tablet Commonly known  as: LOPRESSOR  Take 1 tablet (25 mg total) by mouth 2 (two) times daily.   ondansetron  4 MG tablet Commonly known as: Zofran  Take 1 tablet (4 mg total) by mouth daily as needed for nausea or vomiting.   ondansetron  8 MG disintegrating tablet Commonly known as: ZOFRAN -ODT Take 1 tablet (8 mg total) by mouth every 8 (eight) hours as needed.   oxyCODONE  5 MG immediate release tablet Commonly known as: Oxy IR/ROXICODONE  Take 1 tablet (5 mg total) by mouth every 4 (four) hours as needed for up to 5 days for moderate pain (pain score 4-6).   oxyCODONE -acetaminophen  5-325 MG tablet Commonly known as: Percocet Take 1 tablet by mouth every 6 (six) hours as needed for severe pain (pain score 7-10).   predniSONE  5 MG tablet Commonly known as: DELTASONE  Take 1 tablet (5 mg total) by mouth daily with  breakfast for 5 days.   simvastatin  20 MG tablet Commonly known as: ZOCOR  TAKE 1 TABLET(20 MG) BY MOUTH DAILY IN THE AFTERNOON What changed: See the new instructions.   sulfamethoxazole -trimethoprim  800-160 MG tablet Commonly known as: Bactrim  DS Take 1 tablet by mouth daily for 7 days.   tamsulosin  0.4 MG Caps capsule Commonly known as: Flomax  Take 1 capsule (0.4 mg total) by mouth daily after supper.   Vitamin D  (Ergocalciferol ) 1.25 MG (50000 UNIT) Caps capsule Commonly known as: DRISDOL  Take 1 capsule (50,000 Units total) by mouth every 7 (seven) days.        Allergies:  Allergies  Allergen Reactions   Doxycycline Nausea Only    Respiratory Distress, nausea, loss of appetite   Penicillins Hives and Nausea Only   Ciprofloxacin  Other (See Comments)    Dry mouth, skin tingling, panicky, stomach pain, anxious and nervous    Estrace  [Estradiol ]     Vaginal discharge    Mirtazapine     Made BP drop and felt faint    Nitrofurantoin  Other (See Comments)    Chills and hot flashes    Polymox [Amoxicillin] Hives   Progesterone      Burning / vaginal discharge     Family History: Family History  Problem Relation Age of Onset   Multiple myeloma Mother    Cancer Mother    Stroke Father    Diabetes Father    Heart disease Father    Breast cancer Sister    Stroke Other    Diabetes Other    Anxiety disorder Sister    Cancer Sister    Depression Sister    Anxiety disorder Daughter    Depression Daughter    Anxiety disorder Son    Depression Son    Colon cancer Neg Hx     Social History:  reports that she quit smoking about 7 years ago. Her smoking use included cigarettes. She started smoking about 20 years ago. She has a 6.5 pack-year smoking history. She has never used smokeless tobacco. She reports that she does not currently use alcohol. She reports current drug use. Frequency: 3.00 times per week. Drug: Marijuana.  ROS: All other review of systems were reviewed  and are negative except what is noted above in HPI  Physical Exam: BP (!) 89/59   Pulse 80   Constitutional:  Alert and oriented, No acute distress. HEENT: [place] AT, moist mucus membranes.  Trachea midline, no masses. Cardiovascular: No clubbing, cyanosis, or edema. Respiratory: Normal respiratory effort, no increased work of breathing. GI: Abdomen is soft, nontender, nondistended, no abdominal masses GU: No CVA tenderness.  Lymph: No cervical or inguinal lymphadenopathy. Skin: No rashes, bruises or suspicious lesions. Neurologic: Grossly intact, no focal deficits, moving all 4 extremities. Psychiatric: Normal mood and affect.  Laboratory Data: Lab Results  Component Value Date   WBC 10.4 05/31/2024   HGB 12.0 05/31/2024   HCT 36.9 05/31/2024   MCV 88.1 05/31/2024   PLT 289 05/31/2024    Lab Results  Component Value Date   CREATININE 0.96 05/31/2024    No results found for: PSA  No results found for: TESTOSTERONE  Lab Results  Component Value Date   HGBA1C 5.4 10/01/2023    Urinalysis    Component Value Date/Time   COLORURINE YELLOW 05/30/2024 1002   APPEARANCEUR TURBID (A) 05/30/2024 1002   APPEARANCEUR Clear 04/30/2024 0952   LABSPEC 1.012 05/30/2024 1002   PHURINE 7.0 05/30/2024 1002   GLUCOSEU NEGATIVE 05/30/2024 1002   HGBUR SMALL (A) 05/30/2024 1002   BILIRUBINUR NEGATIVE 05/30/2024 1002   BILIRUBINUR Negative 04/30/2024 0952   KETONESUR NEGATIVE 05/30/2024 1002   PROTEINUR 100 (A) 05/30/2024 1002   UROBILINOGEN 0.2 11/28/2022 1420   NITRITE NEGATIVE 05/30/2024 1002   LEUKOCYTESUR MODERATE (A) 05/30/2024 1002    Lab Results  Component Value Date   LABMICR See below: 04/30/2024   WBCUA 6-10 (A) 04/30/2024   LABEPIT 0-10 04/30/2024   BACTERIA FEW (A) 05/30/2024    Pertinent Imaging: KUb today: Images reviewed and discussed with the patient  No results found for this or any previous visit.  No results found for this or any previous  visit.  No results found for this or any previous visit.  No results found for this or any previous visit.  Results for orders placed during the hospital encounter of August 01, 2022  US  RENAL  Narrative CLINICAL DATA:  Renal dysfunction  EXAM: RENAL / URINARY TRACT ULTRASOUND COMPLETE  COMPARISON:  Previous studies including PET-CT done on August 01, 2022  FINDINGS: Right Kidney:  Renal measurements: 12.3 x 5.5 x 4.3 cm = volume: 154 mL. There is no hydronephrosis. There is minimal increased cortical echogenicity.  Left Kidney:  Renal measurements: 11.5 x 5.3 x 4.6 cm = volume: 146 mL. There is no hydronephrosis. There is 4 mm hyperechoic focus in the upper pole of left kidney suggesting renal stone. There may be few other smaller stones in the upper pole of left kidney. There is slight increase in cortical echogenicity.  Bladder:  Appears normal for degree of bladder distention.  Other:  None.  IMPRESSION: There is no hydronephrosis. There is slightly increased cortical echogenicity in both kidneys which may be a technical artifact or suggest medical renal disease. There are few small nonobstructing calculi in the upper pole of left kidney.   Electronically Signed By: Gearldine Mary M.D. On: 07/28/2022 13:10  No results found for this or any previous visit.  No results found for this or any previous visit.  Results for orders placed during the hospital encounter of 01/13/22  CT RENAL STONE STUDY  Narrative CLINICAL DATA:  Chest wall pain and suspected infection. Worsening weakness and lethargy for several weeks. Acute renal failure.  EXAM: CT CHEST, ABDOMEN AND PELVIS WITHOUT CONTRAST  TECHNIQUE: Multidetector CT imaging of the chest, abdomen and pelvis was performed following the standard protocol without IV contrast.  RADIATION DOSE REDUCTION: This exam was performed according to the departmental dose-optimization program which includes  automated exposure control, adjustment of the mA and/or kV according to patient size and/or use of iterative reconstruction technique.  COMPARISON:  [facility] only CT on 09/12/2015  FINDINGS: CT CHEST FINDINGS  Cardiovascular: No acute findings.  Mediastinum/Lymph Nodes: Mild bilateral hilar and subpectoral lymphadenopathy is seen bilaterally. Largest index lymph node in the right axilla measures 1.5 cm on image 38/2. Multiple sub-cm mediastinal lymph nodes are noted, none of which are pathologically enlarged. No definite hilar lymphadenopathy identified although mild hilar lymphadenopathy is difficult to exclude on this unenhanced exam.  Lungs/Pleura: Mild bandlike opacities in both lower lobes may be due to mild scarring or atelectasis. No evidence of infiltrate, mass, or pleural effusion.  Musculoskeletal:  No suspicious bone lesions identified.  CT ABDOMEN AND PELVIS FINDINGS  Hepatobiliary: No masses visualized on this unenhanced exam. Gallbladder is unremarkable. No evidence of biliary ductal dilatation.  Pancreas: No mass or inflammatory changes identified on this unenhanced exam.  Spleen:  Within normal limits in size.  Adrenals/Urinary Tract: 2 mm nonobstructing calculus noted in the upper pole of the left kidney. No evidence of ureteral calculi or hydronephrosis. Unremarkable appearance of bladder.  Stomach/Bowel: No evidence of obstruction, inflammatory process, or abnormal fluid collections.  Vascular/Lymphatic: Mild abdominal lymphadenopathy is seen within the retroperitoneum and small bowel mesentery. Mild pelvic lymphadenopathy is seen throughout the iliac chains and bilateral inguinal regions. Index lymph node in the left external iliac chain measures 1.4 cm on image 70/2. No abdominal aortic aneurysm.  Reproductive:  No masses or other significant abnormality.  Other:  None.  Musculoskeletal:  No suspicious bone lesions identified.  IMPRESSION: Mild  lymphadenopathy throughout the chest, abdomen, and pelvis. This is suspicious for lymphoproliferative disorder, or less likely metastatic disease.  Tiny nonobstructing left renal calculus. No evidence of ureteral calculi or hydronephrosis.   Electronically Signed By: Norleen DELENA Kil M.D. On: 01/15/2022 13:29   Assessment & Plan:    1. Nephrolithiasis (Primary) Dietary handout given -followup 3 months with renal US  - Urinalysis, Routine w reflex microscopic  2. Frequent UTI -followup 3 months with a UA   No follow-ups on file.  Belvie Clara, MD  [facility] [place]

## 2024-06-11 ENCOUNTER — Ambulatory Visit: Admitting: Urology

## 2024-06-11 ENCOUNTER — Encounter: Payer: Self-pay | Admitting: Family Medicine

## 2024-06-11 NOTE — Progress Notes (Signed)
   Leah Padilla     MRN: [id number]      DOB: March 31, 1961  Chief Complaint  Patient presents with   Rash    Rash on both legs x1 week    HPI Leah Padilla is here with above complaint, no new exposureto body care product , detergent or plant. No redness, warmth or drainage.  Associated with the rash.  No fever No  Family members or associates have a similar  ROS Denies recent fever or chills. Denies sinus pressure, nasal congestion, ear pain or sore throat. Denies chest congestion, productive cough or wheezing.. Denies depression, anxiety or insomnia. Leah Padilla   PE  BP 106/73   Pulse 87   Resp 16   Ht 5' 8.5 (1.74 m)   Wt 150 lb 1.9 oz (68.1 kg)   SpO2 98%   BMI 22.49 kg/m   Patient alert and oriented and in no cardiopulmonary distress.  HEENT: No facial asymmetry, EOMI,     Neck supple .  Chest: Clear to auscultation bilaterally.  CVS: S1, S2 no murmurs, no S3.Regular rate.    Ext: No edema  Skin: mildly erythematous macular rash on both lower extremeties involving approx 33 % of skinP Assessment & Plan  Rash Refer derm, hydroxyzine , prednisone  and betamethasone  prescribed  Essential hypertension Controlled, no change in medication

## 2024-06-11 NOTE — Assessment & Plan Note (Signed)
 Controlled, no change in medication

## 2024-06-13 ENCOUNTER — Inpatient Hospital Stay: Admitting: Nurse Practitioner

## 2024-06-17 ENCOUNTER — Ambulatory Visit

## 2024-06-17 VITALS — BP 106/72 | HR 90 | Ht 68.0 in | Wt 152.2 lb

## 2024-06-17 DIAGNOSIS — R221 Localized swelling, mass and lump, neck: Secondary | ICD-10-CM | POA: Diagnosis not present

## 2024-06-17 DIAGNOSIS — F1290 Cannabis use, unspecified, uncomplicated: Secondary | ICD-10-CM | POA: Diagnosis not present

## 2024-06-17 DIAGNOSIS — F17290 Nicotine dependence, other tobacco product, uncomplicated: Secondary | ICD-10-CM | POA: Diagnosis not present

## 2024-06-17 NOTE — Progress Notes (Signed)
 NAME: Leah Padilla  MRN: [id number]  DOB: May 11, 1961   Referring physician: Zarwolo, Gloria, FNP  PCP: Zarwolo, Gloria, FNP   CHIEF COMPLAINT: Soft tissue mass of the midline of neck.   HPI:  Leah Padilla is a 63 y.o. year old female who presents with a soft tissue mass that is non painful, soft and has enlarged over the last few months slowly.No episodes of infection noted. Patient vapes daily and smokes marihuana daily. She had a large mass removed when she was younger. Does not recall the diagnosis, but has a history of sarcoidosis. Had a history of lymph node biopsy on axilla that dehisced and took two months to heal. Patient was actively vaping at the time.   PMH: Past Medical History:  Diagnosis Date   Allergy    Anemia    Anxiety    Bell's palsy    Depression    Heart murmur When I was a baby out grown   High cholesterol    Hypertension    Sarcoidosis    PSH:  Past Surgical History:  Procedure Laterality Date   AXILLARY LYMPH NODE BIOPSY Right 01/17/2022   Procedure: AXILLARY LYMPH NODE BIOPSY;  Surgeon: Evonnie Dorothyann LABOR, DO;  Location: [facility]S;  Service: General;  Laterality: Right;   BIOPSY  08/01/2022   Procedure: BIOPSY;  Surgeon: Cindie Carlin POUR, DO;  [facility];  Service: Endoscopy;;   COLONOSCOPY N/A 04/17/2013   Procedure: COLONOSCOPY;  Surgeon: Claudis RAYMOND Rivet, MD;  [facility];  Service: Endoscopy;  Laterality: N/A;  930   COLONOSCOPY WITH PROPOFOL  N/A 08/01/2022   Procedure: COLONOSCOPY WITH PROPOFOL ;  Surgeon: Cindie Carlin POUR, DO;  Location[facility];  Service: Endoscopy;  Laterality: N/A;  1:30 pm   CYST EXCISION     neck     MEDICATIONS:   Current Outpatient Medications:    ALPRAZolam  (XANAX ) 1 MG tablet, Take 1 mg by mouth 4 (four) times daily as needed for anxiety. , Disp: , Rfl:    amLODipine  (NORVASC ) 10 MG tablet, Take 0.5 tablets (5 mg total) by mouth daily. TAKE 1 TABLET(10 MG) BY MOUTH DAILY,  Disp: , Rfl:    Estradiol  10 MCG TABS vaginal tablet, Place 1 tablet in vagina at bedtime for 2 weeks then 2 x weekly, Disp: 30 tablet, Rfl: 3   imipramine  (TOFRANIL ) 50 MG tablet, Take 50 mg by mouth at bedtime., Disp: , Rfl:    metoprolol  tartrate (LOPRESSOR ) 25 MG tablet, Take 1 tablet (25 mg total) by mouth 2 (two) times daily., Disp: 180 tablet, Rfl: 2   simvastatin  (ZOCOR ) 20 MG tablet, TAKE 1 TABLET(20 MG) BY MOUTH DAILY IN THE AFTERNOON, Disp: 90 tablet, Rfl: 1   tamsulosin  (FLOMAX ) 0.4 MG CAPS capsule, Take 1 capsule (0.4 mg total) by mouth daily after supper., Disp: 30 capsule, Rfl: 0   Vitamin D , Ergocalciferol , (DRISDOL ) 1.25 MG (50000 UNIT) CAPS capsule, Take 1 capsule (50,000 Units total) by mouth every 7 (seven) days. (Patient not taking: Reported on 06/17/2024), Disp: 20 capsule, Rfl: 1   ALLERGIES:  is allergic to doxycycline, penicillins, ciprofloxacin , estrace  [estradiol ], mirtazapine, nitrofurantoin , polymox [amoxicillin], and progesterone .   FAMILY HISTORY:  Family History  Problem Relation Age of Onset   Multiple myeloma Mother    Cancer Mother    Stroke Father    Diabetes Father    Heart disease Father    Breast cancer Sister    Stroke Other    Diabetes Other  Anxiety disorder Sister    Cancer Sister    Depression Sister    Anxiety disorder Daughter    Depression Daughter    Anxiety disorder Son    Depression Son    Colon cancer Neg Hx       VITALS: Vitals:   06/17/24 1436  BP: 106/72  Pulse: 90  SpO2: 99%    Constitutional: Good color, good hydration. VSS. Head and Neck: No lymphadenopathy, thyromegaly or masses  Chest: Normal breathing, Normal shape and excursion.  Subcutaneous mass measures 1 cm x 1 cm.  Has an obvious punctum. Currently not infected.  Mobile and not attached to underlying structures.  No basin lymphadenopathy, satellite lesions or in-transit lesions.  ASSESSMENT/PLAN  Assessment & Plan   Pt. presents with a  subcutaneous mass most representative of cyst. Yet, with the history of previous congenital mass will order an ultrasound to rule out other congenital abnormalities such as thyroglossal duct cyst. Today we discussed the risks, benefits and alternatives to mass excision and possible tissue rearrangement as well, after ultrasound results are back. We discussed the alternatives which include continued observation; however, I told the patient that I do not believe this mass will resolve on its own. We then discussed the benefits of surgical excision which include complete removal of the lesion. We discussed the risks of excision which include seroma, hematoma, infection, bleeding, damage to surrounding healthy tissue, damage to surrounding structures and need for further surgery. We also discussed the risks of wound separation and recurrence of the mass. We discussed scar patterns of tissue rearrangement, if needed.  I explained that the mass will be sent to pathology and if it were to be malignant further surgery may be needed. We discussed the risks of anesthesia. The patient has a good understanding of all the risks and benefits, postoperative course and care. We obtained pictures. All questions were answered.    Recommend to obtain ultrasound and follow up after ultrasound results are back. Patient instructed to stop smoking due to history of dehiscence we will proceed with excision in the treatment room once cotinine test comes back negative. Risks, benefits and limitations were discussed. Follow up 2 weeks.  Shanyah Gattuso M. Heaven Meeker, MD [facility]

## 2024-06-20 ENCOUNTER — Ambulatory Visit (INDEPENDENT_AMBULATORY_CARE_PROVIDER_SITE_OTHER)

## 2024-06-20 VITALS — BP 109/70 | HR 76 | Ht 68.5 in | Wt 152.0 lb

## 2024-06-20 DIAGNOSIS — N390 Urinary tract infection, site not specified: Secondary | ICD-10-CM | POA: Diagnosis not present

## 2024-06-20 DIAGNOSIS — I10 Essential (primary) hypertension: Secondary | ICD-10-CM

## 2024-06-20 DIAGNOSIS — Z23 Encounter for immunization: Secondary | ICD-10-CM | POA: Diagnosis not present

## 2024-06-20 DIAGNOSIS — N200 Calculus of kidney: Secondary | ICD-10-CM | POA: Diagnosis not present

## 2024-06-20 DIAGNOSIS — F419 Anxiety disorder, unspecified: Secondary | ICD-10-CM

## 2024-06-20 NOTE — Progress Notes (Signed)
 Established Patient Office Visit  Subjective   Patient ID: Leah Padilla, female    DOB: [birth year]  Age: 63 y.o. MRN: [id number]  Chief Complaint  Patient presents with   Hospitalization Follow-up    HPI Leah Padilla  is a 63 y.o. female, with past medical history of hypertension, hyperlipidemia, Bell's palsy, depression, and with history of lymphadenopathy, with diagnosis of sarcoidosis, where she is following at [facility] regarding that. Patient presents today secondary to complaints of abdominal pain, reports left lower quadrant abdominal pain for last 2 days, radiating to left groin area, denies fever, chills, no nausea, or vomiting, which prompted her to come to ED, with history of recurrent UTI.  Was recently urine culture growing Proteus 04/30/2024 pansensitive. -In ED her workup significant for positive UA, and CT abdomen pelvis significant for left hydronephrosis, so was admitted for further evaluation and treatment.  Discussed the use of AI scribe software for clinical note transcription with the patient, who gave verbal consent to proceed.  History of Present Illness   Leah Padilla is a 63 year old female with hypertension and recurrent urinary tract infections who presents for follow-up after a recent hospital stay.  Recurrent urinary tract infections and nephrolithiasis - Recent hospitalization for urinary tract infection and kidney stone - History of recurrent urinary tract infections, often associated with sexual activity, though no recent sexual activity - Evaluated by urology and gynecology; symptoms attributed to menopause - Prescribed a medication for daily use for two weeks, but received only nine doses due to insurance limitations and adjusted to twice weekly dosing - Experienced stomach pain with the medication - Allergic reaction to a previously tried cream - X-ray confirmed passage of kidney stone  Cutaneous sarcoidosis - History of sarcoidosis primarily  affecting the skin - Seen by specialists at [facility] - Recent exacerbation of skin condition following an insect bite - Dermatologist did not provide treatment or recommendations - Referred to plastic surgery but concerned about insurance coverage for procedures  Fatigue and generalized weakness - Ongoing fatigue and weakness since diagnosis of sarcoidosis - On disability and finds it difficult to return to work - Symptoms impact mental health, contributing to anxiety and panic  Bell's palsy and lymphadenopathy - History of Bell's palsy - Multiple hospitalizations in 2023 for hypercalcemia and lymphadenopathy, initially suspected to be malignancy - Diagnosed with sarcoidosis, currently in remission  Hypertension - Currently taking antihypertensive medication - Medication regimen adjusted during recent hospitalization due to low nighttime blood pressure readings - Difficulty splitting pills, currently taking only the morning dose - Considering use of a pill cutter or switching to a lower dose to facilitate twice-daily dosing      ROS    Objective:     BP 109/70   Pulse 76   Ht 5' 8.5 (1.74 m)   Wt 152 lb (68.9 kg)   SpO2 97%   BMI 22.78 kg/m  BP Readings from Last 3 Encounters:  06/20/24 109/70  06/17/24 106/72  06/06/24 (!) 89/59   Wt Readings from Last 3 Encounters:  06/20/24 152 lb (68.9 kg)  06/17/24 152 lb 3.2 oz (69 kg)  05/30/24 151 lb 9.6 oz (68.8 kg)     Physical Exam Vitals and nursing note reviewed.  Constitutional:      Appearance: Normal appearance.  HENT:     Head: Normocephalic.     Right Ear: Tympanic membrane, ear canal and external ear normal.     Left Ear: Tympanic membrane, ear  canal and external ear normal.     Nose: Nose normal.     Mouth/Throat:     Mouth: Mucous membranes are moist.     Pharynx: Oropharynx is clear.  Eyes:     Extraocular Movements: Extraocular movements intact.     Pupils: Pupils are equal, round, and  reactive to light.  Cardiovascular:     Rate and Rhythm: Normal rate and regular rhythm.  Pulmonary:     Effort: Pulmonary effort is normal.     Breath sounds: Normal breath sounds.  Musculoskeletal:     Cervical back: Normal range of motion and neck supple.  Skin:    General: Skin is warm and dry.  Neurological:     Mental Status: She is alert and oriented to person, place, and time.  Psychiatric:        Mood and Affect: Mood normal.        Thought Content: Thought content normal.     No results found for any visits on 06/20/24.    The 10-year ASCVD risk score (Arnett DK, et al., 2019) is: 3.6%    Assessment & Plan:   Problem List Items Addressed This Visit       Cardiovascular and Mediastinum   Essential hypertension   Difficulty splitting medication affecting dosing schedule. Low nighttime blood pressure noted in hospital. - Recommend obtaining a pill cutter. - Advise taking half a pill in the morning and half at night.        Genitourinary   Recurrent UTI - Primary   Recurrent UTIs linked to menopause and recent kidney stone episode. Medication causing stomach discomfort. Insurance issues affecting treatment access. - Ensure adequate hydration.      Kidney stone   Kidney stone passed, confirmed by imaging. - Encourage hydration to prevent future stones.        Other   Anxiety   Depression and anxiety worsened by health issues and disrupted mental health care. Impact on daily functioning noted. - Explore online therapy options. - Consider online job opportunities.      Other Visit Diagnoses       Encounter for immunization       Relevant Orders   Flu vaccine trivalent PF, 6mos and older(Flulaval,Afluria,Fluarix,Fluzone) (Completed)         No follow-ups on file.    Leita Longs, FNP

## 2024-06-23 ENCOUNTER — Ambulatory Visit (HOSPITAL_COMMUNITY)
Admission: RE | Admit: 2024-06-23 | Discharge: 2024-06-23 | Disposition: A | Discharge status: Home / Self Care | Source: Ambulatory Visit

## 2024-06-23 DIAGNOSIS — R221 Localized swelling, mass and lump, neck: Secondary | ICD-10-CM | POA: Diagnosis present

## 2024-06-24 NOTE — Assessment & Plan Note (Signed)
 Depression and anxiety worsened by health issues and disrupted mental health care. Impact on daily functioning noted. - Explore online therapy options. - Consider online job opportunities.

## 2024-06-24 NOTE — Assessment & Plan Note (Signed)
 Kidney stone passed, confirmed by imaging. - Encourage hydration to prevent future stones.

## 2024-06-24 NOTE — Assessment & Plan Note (Signed)
 Recurrent UTIs linked to menopause and recent kidney stone episode. Medication causing stomach discomfort. Insurance issues affecting treatment access. - Ensure adequate hydration.

## 2024-06-24 NOTE — Assessment & Plan Note (Signed)
 Difficulty splitting medication affecting dosing schedule. Low nighttime blood pressure noted in hospital. - Recommend obtaining a pill cutter. - Advise taking half a pill in the morning and half at night.

## 2024-07-01 ENCOUNTER — Encounter (INDEPENDENT_AMBULATORY_CARE_PROVIDER_SITE_OTHER): Payer: Self-pay

## 2024-07-01 ENCOUNTER — Ambulatory Visit

## 2024-07-01 VITALS — BP 117/77 | HR 78

## 2024-07-01 DIAGNOSIS — R221 Localized swelling, mass and lump, neck: Secondary | ICD-10-CM | POA: Diagnosis not present

## 2024-07-01 DIAGNOSIS — Q892 Congenital malformations of other endocrine glands: Secondary | ICD-10-CM

## 2024-07-01 DIAGNOSIS — F17290 Nicotine dependence, other tobacco product, uncomplicated: Secondary | ICD-10-CM

## 2024-07-01 DIAGNOSIS — F1290 Cannabis use, unspecified, uncomplicated: Secondary | ICD-10-CM | POA: Diagnosis not present

## 2024-07-01 NOTE — Progress Notes (Signed)
   Established Patient Office Visit  Subjective   Patient ID: Danica Camarena, female    DOB: 1961/07/09  Age: 63 y.o. MRN: [id number]  Chief Complaint  Patient presents with   Follow-up   Vyctoria Dickman is a 63 y.o. year old female who presents for follow up after US  of the neck. She has a history of a soft tissue mass that is non painful, soft and has enlarged over the last few months slowly.No episodes of infection noted. Patient vapes daily and smokes marihuana daily. She had a large mass removed when she was younger. Does not recall the diagnosis, but has a history of sarcoidosis. Had a history of lymph node biopsy on axilla that dehisced and took two months to heal. Patient was actively vaping at the time. On exam neck cyst is unchanged. On ultrasound this cyst is compatible with, but not diagnostic of, a thyroglossal duct cyst. I explained to the patient this will require possibly a Sistrunk procedure, which I do not routinely do. We will refer the patient to ENT, Dr. Tobie for evaluation.   Metzli Pollick M. Areli Frary, MD [facility]

## 2024-07-02 ENCOUNTER — Encounter: Payer: Self-pay | Admitting: Adult Health

## 2024-07-02 ENCOUNTER — Ambulatory Visit (INDEPENDENT_AMBULATORY_CARE_PROVIDER_SITE_OTHER): Admitting: Adult Health

## 2024-07-02 VITALS — BP 110/73 | HR 69 | Ht 68.5 in | Wt 158.5 lb

## 2024-07-02 DIAGNOSIS — R350 Frequency of micturition: Secondary | ICD-10-CM

## 2024-07-02 DIAGNOSIS — N952 Postmenopausal atrophic vaginitis: Secondary | ICD-10-CM

## 2024-07-02 LAB — POCT URINALYSIS DIPSTICK
Glucose, UA: NEGATIVE
Ketones, UA: NEGATIVE
Nitrite, UA: NEGATIVE
Protein, UA: NEGATIVE

## 2024-07-02 MED ORDER — ESTRADIOL 10 MCG VA TABS: ORAL_TABLET | VAGINAL | 3 refills | Status: AC

## 2024-07-02 NOTE — Progress Notes (Signed)
  Subjective:     Patient ID: Leah Padilla, female   DOB: May 17, 1961, 63 y.o.   MRN: [id number]  HPI Leah Padilla is a 62 year old white female,divorced, PM back in follow  up on starting vagifem  in August and is not as dry, she has nocturnal urinary frequency, about 2 x and has had a kidney stone, since last visit.  Last pap was 03/17/24, negative per pt in [place]  PCP is Meade Gerlach NP  Review of Systems Vaginal dryness is better   nocturnal urinary frequency, about 2 x and has had a kidney stone, since last visit. Reviewed past medical,surgical, social and family history. Reviewed medications and allergies.  Objective:   Physical Exam BP 110/73 (BP Location: Right Arm, Patient Position: Sitting, Cuff Size: Normal)   Pulse 69   Ht 5' 8.5 (1.74 m)   Wt 158 lb 8 oz (71.9 kg)   BMI 23.75 kg/m  urine dipstick trace leuks and trace blood.  Skin warm and dry.Pelvic: external genitalia is normal in appearance no lesions, vagina:pinker and not as dry,urethra has no lesions or masses noted, cervix:smooth, uterus: normal size, shape and contour, non tender, no masses felt, adnexa: no masses or tenderness noted. Bladder is non tender and no masses felt.    Fall risk is low  Upstream - 07/02/24 1043       Pregnancy Intention Screening   Does the patient want to become pregnant in the next year? N/A    Does the patient's partner want to become pregnant in the next year? N/A    Would the patient like to discuss contraceptive options today? N/A      Contraception Wrap Up   Current Method Abstinence   PM   End Method Abstinence   PM   Contraception Counseling Provided No         Examination chaperoned by Clarita Salt LPN  Assessment:     1. Urinary frequency nocturnal urinary frequency, about 2 x and has had a kidney stone, since last visit. UA C&S sent to rule out UTI - POCT Urinalysis Dipstick - Urine Culture - Urinalysis, Routine w reflex microscopic  2. Vaginal atrophy  (Primary) Vaginal dryness is better Will refill vagifem  Meds ordered this encounter  Medications   Estradiol  10 MCG TABS vaginal tablet    Sig: Place 1 tablet in vagina at bedtime  2 x weekly    Dispense:  24 tablet    Refill:  3    Supervising Provider:   JAYNE VONN DEL [[id number]]       Plan:     Follow up in 6 months or sooner if needed

## 2024-07-03 LAB — MICROSCOPIC EXAMINATION
Bacteria, UA: NONE SEEN
Casts: NONE SEEN /LPF
Epithelial Cells (non renal): NONE SEEN /HPF (ref 0–10)

## 2024-07-03 LAB — URINALYSIS, ROUTINE W REFLEX MICROSCOPIC
Bilirubin, UA: NEGATIVE
Glucose, UA: NEGATIVE
Ketones, UA: NEGATIVE
Nitrite, UA: NEGATIVE
Protein,UA: NEGATIVE
Specific Gravity, UA: 1.01 (ref 1.005–1.030)
Urobilinogen, Ur: 0.2 mg/dL (ref 0.2–1.0)
pH, UA: 7 (ref 5.0–7.5)

## 2024-07-04 ENCOUNTER — Ambulatory Visit: Payer: Self-pay | Admitting: Adult Health

## 2024-07-04 LAB — URINE CULTURE

## 2024-07-21 ENCOUNTER — Ambulatory Visit: Admitting: Family Medicine

## 2024-07-22 ENCOUNTER — Other Ambulatory Visit: Payer: Self-pay | Admitting: Family Medicine

## 2024-07-22 DIAGNOSIS — I10 Essential (primary) hypertension: Secondary | ICD-10-CM

## 2024-07-25 ENCOUNTER — Other Ambulatory Visit: Payer: Self-pay | Admitting: Family Medicine

## 2024-07-25 DIAGNOSIS — I10 Essential (primary) hypertension: Secondary | ICD-10-CM

## 2024-07-29 ENCOUNTER — Ambulatory Visit: Admitting: Urology

## 2024-08-18 ENCOUNTER — Ambulatory Visit (HOSPITAL_COMMUNITY)
Admission: RE | Admit: 2024-08-18 | Discharge: 2024-08-18 | Disposition: A | Discharge status: Home / Self Care | Source: Ambulatory Visit | Attending: Urology | Admitting: Urology

## 2024-08-18 DIAGNOSIS — N200 Calculus of kidney: Secondary | ICD-10-CM | POA: Diagnosis present

## 2024-08-18 DIAGNOSIS — N132 Hydronephrosis with renal and ureteral calculous obstruction: Secondary | ICD-10-CM | POA: Diagnosis not present

## 2024-08-26 ENCOUNTER — Institutional Professional Consult (permissible substitution) (INDEPENDENT_AMBULATORY_CARE_PROVIDER_SITE_OTHER): Admitting: Otolaryngology

## 2024-08-31 ENCOUNTER — Other Ambulatory Visit: Payer: Self-pay | Admitting: Family Medicine

## 2024-08-31 DIAGNOSIS — E7849 Other hyperlipidemia: Secondary | ICD-10-CM

## 2024-09-08 ENCOUNTER — Ambulatory Visit: Admitting: Urology

## 2024-09-09 ENCOUNTER — Telehealth: Payer: Self-pay

## 2024-09-09 DIAGNOSIS — N200 Calculus of kidney: Secondary | ICD-10-CM

## 2024-09-09 NOTE — Telephone Encounter (Signed)
 Patient called and made aware.

## 2024-09-18 ENCOUNTER — Encounter (INDEPENDENT_AMBULATORY_CARE_PROVIDER_SITE_OTHER): Payer: Self-pay | Admitting: Otolaryngology

## 2024-09-18 ENCOUNTER — Ambulatory Visit (INDEPENDENT_AMBULATORY_CARE_PROVIDER_SITE_OTHER): Admitting: Otolaryngology

## 2024-09-18 VITALS — BP 107/72 | HR 77 | Ht 68.5 in | Wt 164.0 lb

## 2024-09-18 DIAGNOSIS — R221 Localized swelling, mass and lump, neck: Secondary | ICD-10-CM | POA: Diagnosis not present

## 2024-09-18 NOTE — Patient Instructions (Signed)
 I have ordered an imaging study for you to complete prior to your next visit. Please call [facility] Scheduling at [phone] to schedule your imaging if you have not received a call within 24 hours. If you are unable to complete your imaging study prior to your next scheduled visit please call our office to let us  know.

## 2024-09-18 NOTE — Progress Notes (Signed)
 Dear Dr. Montorfano, Here is my assessment for our mutual patient, Leah Padilla. Thank you for allowing me the opportunity to care for your patient. Please do not hesitate to contact me should you have any other questions. Sincerely, Dr. Eldora Blanch  Otolaryngology Clinic Note Referring provider: Dr. Eustacio HPI:  Leah Padilla is a 63 y.o. female kindly referred by Dr. Montorfano for evaluation of neck nodule  Initial visit (09/2024): Discussed the use of AI scribe software for clinical note transcription with the patient, who gave verbal consent to proceed.  History of Present Illness Leah Padilla is a 63 year old female with sarcoidosis who presents with a persistent neck lesion.  Approximately five years ago, she noticed a neck lesion that began like a pimple. A friend expressed some drainage at that time, but since then the lesion has remained similar in size, with intermittent fluctuation and occasional fluid appearance which can sometimes be foul smelling. It is non-tender, and a small black spot has developed on it. It has never been biopsied. She denies other neck masses, unintentional weight loss, or otalgia.  She has sarcoidosis diagnosed and she follows annually at [facility] and uses medication only during symptomatic flares. She reports fatigue and skin issues related to sarcoidosis that have not been evaluated by dermatology.  She previously used tobacco She currently uses marijuana.   Patient otherwise denies: unintentional weight loss, no ear pain or other neck masses No hypo or hyperthyroid symptoms No FHX of thyroid  cancer. Did have adenopathy due to sarcoidosis, but has not noted oher neck masses  Did have right neck surgery for a cyst when she was 11, does not remember exact pathology or what was done.   ENT Surgery: no Personal or FHx of bleeding dz or anesthesia difficulty: no  AP/AC: no  Tobacco: prior, denies current use  PMHx: Sarcoidosis,  Nephrolithiasis, HTN, GAD, LAD, Bell's palsy (2023)  Independent Review of Additional Tests or Records:  Dr. 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, is above thyroid  isthmus --- not entirely sure if strap muscles (within) or subcutaneous and just abutting strap muscles PET/Ct 07/27/2022 interpreted with respect to above -- appears to be subcutaneous mass (approx same) approx over same area as US  and palpable Labs CBC and CMP 05/31/2024: WBC 10.4, Plt 289; BUN/Cr 15/1.19 TSH 2025: 1.222 PMH/Meds/All/SocHx/FamHx/ROS:   Past Medical History:  Diagnosis Date   Allergy    Anemia    Anxiety    Bell's palsy    Depression    Heart murmur When I was a baby out grown   High cholesterol    Hypertension    Kidney stone    Sarcoidosis      Past Surgical History:  Procedure Laterality Date   AXILLARY LYMPH NODE BIOPSY Right 01/17/2022   Procedure: AXILLARY LYMPH NODE BIOPSY;  Surgeon: Evonnie Dorothyann LABOR, DO;  Location: [facility];  Service: General;  Laterality: Right;   BIOPSY  08/01/2022   Procedure: BIOPSY;  Surgeon: Cindie Carlin POUR, DO;  Location: [facility];  Service: Endoscopy;;   COLONOSCOPY N/A 04/17/2013   Procedure: COLONOSCOPY;  Surgeon: Claudis RAYMOND Rivet, MD;  Location: [facility];  Service: Endoscopy;  Laterality: N/A;  930   COLONOSCOPY WITH PROPOFOL  N/A 08/01/2022   Procedure: COLONOSCOPY WITH PROPOFOL ;  Surgeon: Cindie Carlin POUR,  DO;  Location: [facility];  Service: Endoscopy;  Laterality: N/A;  1:30 pm   CYST EXCISION     neck    Family History  Problem Relation Age of Onset   Multiple myeloma Mother    Cancer Mother    Stroke Father    Diabetes Father    Heart disease Father    Breast cancer Sister    Stroke Other    Diabetes Other    Anxiety disorder Sister     Cancer Sister    Depression Sister    Anxiety disorder Daughter    Depression Daughter    Anxiety disorder Son    Depression Son    Colon cancer Neg Hx      Social Connections: Socially Isolated (05/21/2024)   Social Connection and Isolation Panel    Frequency of Communication with Friends and Family: More than three times a week    Frequency of Social Gatherings with Friends and Family: Once a week    Attends Religious Services: Never    Database Administrator or Organizations: No    Attends Engineer, Structural: Never    Marital Status: Divorced     Current Medications[1]   Physical Exam:   BP 107/72 (BP Location: Left Arm, Patient Position: Sitting, Cuff Size: Large)   Pulse 77   Ht 5' 8.5 (1.74 m)   Wt 164 lb (74.4 kg)   SpO2 96%   BMI 24.57 kg/m   Salient findings:   CN II-XII intact -- mild left lower lip weakness Bilateral EAC clear and TM intact with well pneumatized middle ear spaces Anterior rhinoscopy: Septum intact; bilateral inferior turbinates without significant hypertrophy No lesions of oral cavity/oropharynx No obviously palpable neck masses/lymphadenopathy/thyromegaly -- right neck scar well healed; approx 1x1 cm what appears to be quite superficial lesion -- almost assoc with skin but cannot express anything, does have a punctum No respiratory distress or stridor  Seprately Identifiable Procedures:  Prior to initiating any procedures, risks/benefits/alternatives were explained to the patient and verbal consent obtained. None  Impression & Plans:  Kaileena Obi is a 63 y.o. female with:  1. 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 Seems like also present on PET in 2023? Given h/o neck surgery though, will get CT to confirm; suspect if not TGDC, can excise it if she'd like  She is in agreement  See below regarding exact medications  prescribed this encounter including dosages and route: No orders of the defined types were placed in this encounter.     Thank you for allowing me the opportunity to care for your patient. Please do not hesitate to contact me should you have any other questions.  Sincerely, Eldora Blanch, MD Otolaryngologist (ENT), [facility] Phone: [phone] Fax: [phone]  09/18/2024, 11:54 AM   MDM:  Level 4 - 99204 Complexity/Problems addressed: mod - new problem, unknown diagnosis needs further workup. Data complexity: mod - independent interpretation of US ; review of note, labs - Morbidity: low currently  - Prescription Drug prescribed or managed: n      [1]  Current Outpatient Medications:    ALPRAZolam  (XANAX ) 1 MG tablet, Take 1 mg by mouth 4 (four) times daily as needed for anxiety. , Disp: , Rfl:    amLODipine  (NORVASC ) 10 MG tablet, TAKE 1 TABLET(10 MG) BY MOUTH DAILY, Disp: 90  tablet, Rfl: 1   Estradiol  10 MCG TABS vaginal tablet, Place 1 tablet in vagina at bedtime  2 x weekly, Disp: 24 tablet, Rfl: 3   fluticasone  (FLONASE ) 50 MCG/ACT nasal spray, Place into both nostrils., Disp: , Rfl:    imipramine  (TOFRANIL ) 50 MG tablet, Take 50 mg by mouth at bedtime., Disp: , Rfl:    metoprolol  tartrate (LOPRESSOR ) 25 MG tablet, TAKE 1 TABLET(25 MG) BY MOUTH TWICE DAILY, Disp: 180 tablet, Rfl: 2   simvastatin  (ZOCOR ) 20 MG tablet, TAKE 1 TABLET(20 MG) BY MOUTH DAILY IN THE AFTERNOON, Disp: 90 tablet, Rfl: 1

## 2024-10-02 ENCOUNTER — Encounter (HOSPITAL_COMMUNITY): Payer: Self-pay | Admitting: Oncology

## 2024-10-07 ENCOUNTER — Encounter (HOSPITAL_COMMUNITY): Payer: Self-pay | Admitting: Oncology

## 2024-10-07 ENCOUNTER — Inpatient Hospital Stay: Admission: RE | Admit: 2024-10-07 | Discharge: 2024-10-07 | Attending: Otolaryngology

## 2024-10-07 DIAGNOSIS — R221 Localized swelling, mass and lump, neck: Secondary | ICD-10-CM

## 2024-10-07 MED ORDER — IOPAMIDOL (ISOVUE-300) INJECTION 61%
80.0000 mL | Freq: Once | INTRAVENOUS | Status: AC | PRN
  Administered 2024-10-07: 80 mL via INTRAVENOUS

## 2024-10-14 ENCOUNTER — Ambulatory Visit (HOSPITAL_COMMUNITY)

## 2024-10-16 ENCOUNTER — Ambulatory Visit (INDEPENDENT_AMBULATORY_CARE_PROVIDER_SITE_OTHER): Admitting: Otolaryngology

## 2024-10-16 ENCOUNTER — Encounter (INDEPENDENT_AMBULATORY_CARE_PROVIDER_SITE_OTHER): Payer: Self-pay | Admitting: Otolaryngology

## 2024-10-16 VITALS — BP 96/66 | HR 83 | Ht 68.5 in | Wt 166.0 lb

## 2024-10-16 DIAGNOSIS — R221 Localized swelling, mass and lump, neck: Secondary | ICD-10-CM

## 2024-10-16 NOTE — Progress Notes (Signed)
 Dear Dr. Edman, Here is my assessment for our mutual patient, Leah Padilla. Thank you for allowing me the opportunity to care for your patient. Please do not hesitate to contact me should you have any other questions. Sincerely, Dr. Eldora Blanch  Otolaryngology Clinic Note Referring provider: Dr. Edman HPI:  Leah Padilla is a 64 y.o. female kindly referred by Dr. Edman for evaluation of neck nodule  Initial visit (09/2024): Discussed the use of AI scribe software for clinical note transcription with the patient, who gave verbal consent to proceed.  History of Present Illness Leah Padilla is a 64 year old female with sarcoidosis who presents with a persistent neck lesion.  Approximately five years ago, she noticed a neck lesion that began like a pimple. A friend expressed some drainage at that time, but since then the lesion has remained similar in size, with intermittent fluctuation and occasional fluid appearance which can sometimes be foul smelling. It is non-tender, and a small black spot has developed on it. It has never been biopsied. She denies other neck masses, unintentional weight loss, or otalgia.  She has sarcoidosis diagnosed and she follows annually at [facility] and uses medication only during symptomatic flares. She reports fatigue and skin issues related to sarcoidosis that have not been evaluated by dermatology.  She previously used tobacco She currently uses marijuana.   Patient otherwise denies: unintentional weight loss, no ear pain or other neck masses No hypo or hyperthyroid symptoms No FHX of thyroid  cancer. Did have adenopathy due to sarcoidosis, but has not noted oher neck masses  Did have right neck surgery for a cyst when she was 11, does not remember exact pathology or what was done.   --------------------------------------------------------- 10/16/2024 Returns for follow up. We did discuss her CT. No drainage or pain or swelling from the  lesion.   ENT Surgery: no Personal or FHx of bleeding dz or anesthesia difficulty: no  AP/AC: no  Tobacco: prior, denies current use  PMHx: Sarcoidosis, Nephrolithiasis, HTN, GAD, LAD, Bell's palsy (2023)  Independent Review of Additional Tests or Records:  Dr. 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, is above thyroid  isthmus --- not entirely sure if strap muscles (within) or subcutaneous and just abutting strap muscles PET/Ct 07/27/2022 interpreted with respect to above -- appears to be subcutaneous mass (approx same) approx over same area as US  and palpable Labs CBC and CMP 05/31/2024: WBC 10.4, Plt 289; BUN/Cr 15/1.19 TSH 2025: 1.222 CT Neck 10/07/2024 interpreted independently: no obvious neck lesions/necrotic nodes but appears to have subcutaneous lesion midline central neck  PMH/Meds/All/SocHx/FamHx/ROS:   Past Medical History:  Diagnosis Date   Allergy    Anemia    Anxiety    Bell's palsy    Depression    Heart murmur When I was a baby out grown   High cholesterol    Hypertension    Kidney stone    Sarcoidosis      Past Surgical History:  Procedure Laterality Date   AXILLARY LYMPH NODE BIOPSY Right 01/17/2022   Procedure: AXILLARY LYMPH NODE BIOPSY;  Surgeon: Evonnie Dorothyann LABOR, DO;  Location: [facility];  Service: General;  Laterality: Right;   BIOPSY  08/01/2022   Procedure: BIOPSY;  Surgeon: Cindie Carlin POUR, DO;  Location: [facility];  Service: Endoscopy;;   COLONOSCOPY N/A  04/17/2013   Procedure: COLONOSCOPY;  Surgeon: Claudis RAYMOND Rivet, MD;  Location: [facility];  Service: Endoscopy;  Laterality: N/A;  930   COLONOSCOPY WITH PROPOFOL  N/A 08/01/2022   Procedure: COLONOSCOPY WITH PROPOFOL ;  Surgeon: Cindie Carlin POUR, DO;  Location: [facility];  Service: Endoscopy;   Laterality: N/A;  1:30 pm   CYST EXCISION     neck    Family History  Problem Relation Age of Onset   Multiple myeloma Mother    Cancer Mother    Stroke Father    Diabetes Father    Heart disease Father    Breast cancer Sister    Stroke Other    Diabetes Other    Anxiety disorder Sister    Cancer Sister    Depression Sister    Anxiety disorder Daughter    Depression Daughter    Anxiety disorder Son    Depression Son    Colon cancer Neg Hx      Social Connections: Socially Isolated (05/21/2024)   Social Connection and Isolation Panel    Frequency of Communication with Friends and Family: More than three times a week    Frequency of Social Gatherings with Friends and Family: Once a week    Attends Religious Services: Never    Database Administrator or Organizations: No    Attends Engineer, Structural: Never    Marital Status: Divorced     Current Medications[1]   Physical Exam:   BP 96/66 (BP Location: Left Arm, Patient Position: Sitting, Cuff Size: Large)   Pulse 83   Ht 5' 8.5 (1.74 m)   Wt 166 lb (75.3 kg)   BMI 24.87 kg/m   Salient findings:   CN II-XII intact -- mild left lower lip weakness Bilateral EAC clear and TM intact with well pneumatized middle ear spaces Anterior rhinoscopy: Septum intact; bilateral inferior turbinates without significant hypertrophy No lesions of oral cavity/oropharynx No obviously palpable neck masses/lymphadenopathy/thyromegaly -- right neck scar well healed; approx 2 x2 cm what appears to be quite superficial lesion -- almost assoc with skin but cannot express anything, does have a punctum No respiratory distress or stridor  Seprately Identifiable Procedures:  Prior to initiating any procedures, risks/benefits/alternatives were explained to the patient and verbal consent obtained. None  Impression & Plans:  Leah Padilla is a 64 y.o. female with:  1. 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 Seems like also present on PET in 2023?   We discussed options and she'd like for it to be excised; wound related complications and recurrence discussed.  She is in agreement  See below regarding exact medications prescribed this encounter including dosages and route: No orders of the defined types were placed in this encounter.     Thank you for allowing me the opportunity to care for your patient. Please do not hesitate to contact me should you have any other questions.  Sincerely, Eldora Blanch, MD Otolaryngologist (ENT), [facility] Phone: [phone] Fax: [phone]  10/16/2024, 2:03 PM   MDM:  I have personally spent 32 minutes involved in face-to-face and non-face-to-face activities for this patient on the day of the visit.  Professional time spent excludes any procedures performed but includes the following activities, in addition to those noted in the documentation:  preparing to see the patient (review of outside documentation and results), performing a medically appropriate examination, counseling, documenting in the electronic health record, independently interpreting results (CT).       [1]  Current Outpatient Medications:    ALPRAZolam  (XANAX ) 1 MG tablet, Take 1 mg by mouth 4 (four) times daily as needed for anxiety. , Disp: , Rfl:    amLODipine  (NORVASC ) 10 MG tablet, TAKE 1 TABLET(10 MG) BY MOUTH DAILY, Disp: 90 tablet, Rfl: 1   Estradiol  10 MCG TABS vaginal tablet, Place 1 tablet in vagina at bedtime  2 x weekly, Disp: 24 tablet, Rfl: 3   fluticasone  (FLONASE ) 50 MCG/ACT nasal spray, Place into both nostrils., Disp: , Rfl:    imipramine  (TOFRANIL ) 50 MG tablet, Take 50 mg by mouth at bedtime., Disp: , Rfl:    metoprolol  tartrate (LOPRESSOR ) 25 MG tablet, TAKE 1 TABLET(25 MG) BY MOUTH TWICE DAILY, Disp: 180  tablet, Rfl: 2   simvastatin  (ZOCOR ) 20 MG tablet, TAKE 1 TABLET(20 MG) BY MOUTH DAILY IN THE AFTERNOON, Disp: 90 tablet, Rfl: 1

## 2024-11-20 ENCOUNTER — Ambulatory Visit (INDEPENDENT_AMBULATORY_CARE_PROVIDER_SITE_OTHER): Admitting: Otolaryngology

## 2025-02-25 ENCOUNTER — Other Ambulatory Visit (HOSPITAL_COMMUNITY)

## 2025-03-11 ENCOUNTER — Ambulatory Visit: Admitting: Urology
